# Patient Record
Sex: Female | Born: 1955 | Race: Black or African American | Hispanic: No | Marital: Married | Smoking: Never smoker
Health system: Southern US, Community
[De-identification: ages and names within clinical notes are randomized; demographics above are authoritative.]

## PROBLEM LIST (undated history)

## (undated) DIAGNOSIS — Z803 Family history of malignant neoplasm of breast: Secondary | ICD-10-CM

## (undated) DIAGNOSIS — D649 Anemia, unspecified: Secondary | ICD-10-CM

## (undated) DIAGNOSIS — G8929 Other chronic pain: Secondary | ICD-10-CM

## (undated) DIAGNOSIS — R519 Headache, unspecified: Secondary | ICD-10-CM

## (undated) DIAGNOSIS — I341 Nonrheumatic mitral (valve) prolapse: Secondary | ICD-10-CM

## (undated) DIAGNOSIS — R51 Headache: Secondary | ICD-10-CM

## (undated) DIAGNOSIS — F419 Anxiety disorder, unspecified: Secondary | ICD-10-CM

## (undated) DIAGNOSIS — M12579 Traumatic arthropathy, unspecified ankle and foot: Secondary | ICD-10-CM

## (undated) DIAGNOSIS — J302 Other seasonal allergic rhinitis: Secondary | ICD-10-CM

## (undated) DIAGNOSIS — K219 Gastro-esophageal reflux disease without esophagitis: Secondary | ICD-10-CM

## (undated) DIAGNOSIS — T7840XA Allergy, unspecified, initial encounter: Secondary | ICD-10-CM

## (undated) DIAGNOSIS — G4730 Sleep apnea, unspecified: Secondary | ICD-10-CM

## (undated) DIAGNOSIS — N6019 Diffuse cystic mastopathy of unspecified breast: Secondary | ICD-10-CM

## (undated) DIAGNOSIS — R739 Hyperglycemia, unspecified: Secondary | ICD-10-CM

## (undated) HISTORY — DX: Diffuse cystic mastopathy of unspecified breast: N60.19

## (undated) HISTORY — DX: Other seasonal allergic rhinitis: J30.2

## (undated) HISTORY — DX: Other chronic pain: G89.29

## (undated) HISTORY — DX: Nonrheumatic mitral (valve) prolapse: I34.1

## (undated) HISTORY — DX: Headache: R51

## (undated) HISTORY — DX: Traumatic arthropathy, unspecified ankle and foot: M12.579

## (undated) HISTORY — DX: Headache, unspecified: R51.9

## (undated) HISTORY — DX: Family history of malignant neoplasm of breast: Z80.3

## (undated) HISTORY — DX: Hyperglycemia, unspecified: R73.9

## (undated) HISTORY — DX: Anemia, unspecified: D64.9

## (undated) HISTORY — DX: Allergy, unspecified, initial encounter: T78.40XA

## (undated) HISTORY — DX: Gastro-esophageal reflux disease without esophagitis: K21.9

## (undated) HISTORY — DX: Anxiety disorder, unspecified: F41.9

## (undated) HISTORY — DX: Sleep apnea, unspecified: G47.30

---

## 1977-12-04 HISTORY — PX: BREAST LUMPECTOMY: SHX2

## 1996-09-03 HISTORY — PX: TOTAL ABDOMINAL HYSTERECTOMY: SHX209

## 2003-05-05 HISTORY — PX: ANKLE FRACTURE SURGERY: SHX122

## 2003-12-05 LAB — CONVERTED CEMR LAB: Pap Smear: NORMAL

## 2004-03-06 HISTORY — PX: BREAST BIOPSY: SHX20

## 2004-03-08 ENCOUNTER — Encounter: Payer: Self-pay | Admitting: Family Medicine

## 2006-08-04 LAB — HM MAMMOGRAPHY: HM Mammogram: NORMAL

## 2007-01-11 ENCOUNTER — Ambulatory Visit: Payer: Self-pay | Admitting: Family Medicine

## 2007-01-11 DIAGNOSIS — N6019 Diffuse cystic mastopathy of unspecified breast: Secondary | ICD-10-CM | POA: Insufficient documentation

## 2007-01-11 DIAGNOSIS — N318 Other neuromuscular dysfunction of bladder: Secondary | ICD-10-CM | POA: Insufficient documentation

## 2007-01-11 LAB — CONVERTED CEMR LAB
Glucose, Urine, Semiquant: NEGATIVE
Ketones, urine, test strip: NEGATIVE
Nitrite: NEGATIVE
Urobilinogen, UA: 0.2
WBC, UA: 0 cells/hpf
pH: 6

## 2007-01-14 LAB — CONVERTED CEMR LAB
ALT: 13 units/L (ref 0–35)
AST: 17 units/L (ref 0–37)
Albumin: 4.1 g/dL (ref 3.5–5.2)
Alkaline Phosphatase: 58 units/L (ref 39–117)
BUN: 8 mg/dL (ref 6–23)
Basophils Absolute: 0.1 10*3/uL (ref 0.0–0.1)
Calcium: 9.5 mg/dL (ref 8.4–10.5)
Chloride: 101 meq/L (ref 96–112)
Cholesterol: 180 mg/dL (ref 0–200)
Eosinophils Absolute: 0.1 10*3/uL (ref 0.0–0.6)
GFR calc non Af Amer: 95 mL/min
LDL Cholesterol: 123 mg/dL — ABNORMAL HIGH (ref 0–99)
MCHC: 34.7 g/dL (ref 30.0–36.0)
MCV: 92.4 fL (ref 78.0–100.0)
Monocytes Relative: 5 % (ref 3.0–11.0)
Platelets: 344 10*3/uL (ref 150–400)
RBC: 3.64 M/uL — ABNORMAL LOW (ref 3.87–5.11)
RDW: 12.5 % (ref 11.5–14.6)
Sodium: 135 meq/L (ref 135–145)
TSH: 2.14 microintl units/mL (ref 0.35–5.50)
Total CHOL/HDL Ratio: 3.8
Triglycerides: 46 mg/dL (ref 0–149)

## 2007-02-12 ENCOUNTER — Ambulatory Visit: Payer: Self-pay | Admitting: Family Medicine

## 2007-02-12 DIAGNOSIS — D649 Anemia, unspecified: Secondary | ICD-10-CM

## 2007-04-28 ENCOUNTER — Ambulatory Visit: Payer: Self-pay | Admitting: Family Medicine

## 2007-04-29 ENCOUNTER — Encounter: Payer: Self-pay | Admitting: Family Medicine

## 2007-05-04 ENCOUNTER — Telehealth: Payer: Self-pay | Admitting: Family Medicine

## 2007-05-04 ENCOUNTER — Encounter: Payer: Self-pay | Admitting: Family Medicine

## 2007-05-05 HISTORY — PX: BREAST CYST ASPIRATION: SHX578

## 2007-05-10 ENCOUNTER — Telehealth: Payer: Self-pay | Admitting: Family Medicine

## 2007-05-14 ENCOUNTER — Encounter: Admission: RE | Admit: 2007-05-14 | Discharge: 2007-05-14 | Payer: Self-pay | Admitting: Family Medicine

## 2007-05-26 ENCOUNTER — Ambulatory Visit: Payer: Self-pay | Admitting: Gynecology

## 2007-09-01 ENCOUNTER — Ambulatory Visit: Payer: Self-pay | Admitting: Gynecology

## 2007-09-13 ENCOUNTER — Ambulatory Visit: Payer: Self-pay | Admitting: Family Medicine

## 2007-09-28 ENCOUNTER — Ambulatory Visit: Payer: Self-pay | Admitting: Obstetrics & Gynecology

## 2007-10-12 ENCOUNTER — Telehealth (INDEPENDENT_AMBULATORY_CARE_PROVIDER_SITE_OTHER): Payer: Self-pay | Admitting: *Deleted

## 2007-10-13 ENCOUNTER — Encounter (INDEPENDENT_AMBULATORY_CARE_PROVIDER_SITE_OTHER): Payer: Self-pay | Admitting: *Deleted

## 2007-10-13 ENCOUNTER — Telehealth (INDEPENDENT_AMBULATORY_CARE_PROVIDER_SITE_OTHER): Payer: Self-pay | Admitting: *Deleted

## 2007-10-13 LAB — CONVERTED CEMR LAB
Ketones, urine, test strip: NEGATIVE
Nitrite: NEGATIVE
Protein, U semiquant: NEGATIVE
RBC / HPF: 0
Specific Gravity, Urine: 1.02
Urine crystals, microscopic: 0 /hpf
pH: 6

## 2007-10-14 ENCOUNTER — Encounter: Payer: Self-pay | Admitting: Family Medicine

## 2007-10-19 ENCOUNTER — Ambulatory Visit: Payer: Self-pay | Admitting: Family Medicine

## 2007-10-28 ENCOUNTER — Telehealth: Payer: Self-pay | Admitting: Internal Medicine

## 2007-10-28 ENCOUNTER — Encounter: Payer: Self-pay | Admitting: Family Medicine

## 2007-11-08 ENCOUNTER — Ambulatory Visit: Payer: Self-pay | Admitting: Obstetrics and Gynecology

## 2007-12-07 ENCOUNTER — Ambulatory Visit: Payer: Self-pay | Admitting: Family Medicine

## 2007-12-16 ENCOUNTER — Encounter: Payer: Self-pay | Admitting: Family Medicine

## 2008-01-19 ENCOUNTER — Ambulatory Visit: Payer: Self-pay | Admitting: Family Medicine

## 2008-01-19 LAB — CONVERTED CEMR LAB
OCCULT 2: NEGATIVE
OCCULT 3: NEGATIVE

## 2008-02-14 ENCOUNTER — Ambulatory Visit: Payer: Self-pay | Admitting: Family Medicine

## 2008-02-15 LAB — CONVERTED CEMR LAB
Albumin: 4.1 g/dL (ref 3.5–5.2)
Alkaline Phosphatase: 56 units/L (ref 39–117)
Basophils Absolute: 0.1 10*3/uL (ref 0.0–0.1)
Bilirubin, Direct: 0.1 mg/dL (ref 0.0–0.3)
Eosinophils Absolute: 0.2 10*3/uL (ref 0.0–0.7)
GFR calc Af Amer: 167 mL/min
GFR calc non Af Amer: 138 mL/min
HCT: 32.9 % — ABNORMAL LOW (ref 36.0–46.0)
HDL: 51.9 mg/dL (ref >39.0)
MCV: 93.2 fL (ref 78.0–100.0)
Monocytes Absolute: 0.5 10*3/uL (ref 0.1–1.0)
Platelets: 284 10*3/uL (ref 150–400)
Potassium: 4.1 meq/L (ref 3.5–5.1)
RDW: 12.4 % (ref 11.5–14.6)
Sodium: 138 meq/L (ref 135–145)
Triglycerides: 53 mg/dL (ref 0–149)

## 2008-05-18 ENCOUNTER — Ambulatory Visit: Payer: Self-pay | Admitting: Family Medicine

## 2008-07-25 ENCOUNTER — Encounter: Payer: Self-pay | Admitting: Family Medicine

## 2008-07-31 ENCOUNTER — Encounter (INDEPENDENT_AMBULATORY_CARE_PROVIDER_SITE_OTHER): Payer: Self-pay | Admitting: *Deleted

## 2008-08-02 ENCOUNTER — Ambulatory Visit: Payer: Self-pay | Admitting: Family Medicine

## 2008-08-21 ENCOUNTER — Encounter: Payer: Self-pay | Admitting: Family Medicine

## 2008-08-28 ENCOUNTER — Encounter: Payer: Self-pay | Admitting: Family Medicine

## 2008-08-28 ENCOUNTER — Ambulatory Visit: Payer: Self-pay | Admitting: Family Medicine

## 2008-08-30 ENCOUNTER — Telehealth: Payer: Self-pay | Admitting: Family Medicine

## 2008-10-11 ENCOUNTER — Ambulatory Visit: Payer: Self-pay | Admitting: Family Medicine

## 2008-10-12 LAB — CONVERTED CEMR LAB
Cholesterol: 172 mg/dL (ref 0–200)
Hgb A1c MFr Bld: 6 % (ref 4.6–6.5)
LDL Cholesterol: 108 mg/dL — ABNORMAL HIGH (ref 0–99)
Microalb Creat Ratio: 4.5 mg/g (ref 0.0–30.0)
Triglycerides: 46 mg/dL (ref 0.0–149.0)
VLDL: 9.2 mg/dL (ref 0.0–40.0)

## 2008-10-19 ENCOUNTER — Encounter: Payer: Self-pay | Admitting: Obstetrics & Gynecology

## 2008-10-19 LAB — CONVERTED CEMR LAB
WBC, Wet Prep HPF POC: NONE SEEN
Yeast Wet Prep HPF POC: NONE SEEN

## 2008-11-22 ENCOUNTER — Ambulatory Visit: Payer: Self-pay | Admitting: Family Medicine

## 2008-11-22 DIAGNOSIS — E785 Hyperlipidemia, unspecified: Secondary | ICD-10-CM

## 2008-11-22 LAB — HM DIABETES FOOT EXAM

## 2008-11-28 ENCOUNTER — Encounter: Payer: Self-pay | Admitting: Family Medicine

## 2008-12-15 ENCOUNTER — Telehealth: Payer: Self-pay | Admitting: Family Medicine

## 2009-01-29 ENCOUNTER — Encounter: Payer: Self-pay | Admitting: Family Medicine

## 2009-02-05 ENCOUNTER — Ambulatory Visit: Payer: Self-pay | Admitting: Family Medicine

## 2009-02-19 ENCOUNTER — Ambulatory Visit: Payer: Self-pay | Admitting: Family Medicine

## 2009-02-26 ENCOUNTER — Telehealth: Payer: Self-pay | Admitting: Family Medicine

## 2009-03-01 ENCOUNTER — Ambulatory Visit: Payer: Self-pay | Admitting: Family Medicine

## 2009-03-02 ENCOUNTER — Telehealth: Payer: Self-pay | Admitting: Family Medicine

## 2009-03-05 ENCOUNTER — Encounter: Payer: Self-pay | Admitting: Family Medicine

## 2009-03-07 ENCOUNTER — Ambulatory Visit: Payer: Self-pay | Admitting: Family Medicine

## 2009-03-27 ENCOUNTER — Telehealth (INDEPENDENT_AMBULATORY_CARE_PROVIDER_SITE_OTHER): Payer: Self-pay | Admitting: *Deleted

## 2009-03-27 ENCOUNTER — Ambulatory Visit: Payer: Self-pay | Admitting: Family Medicine

## 2009-03-29 LAB — CONVERTED CEMR LAB
ALT: 13 units/L (ref 0–35)
AST: 16 units/L (ref 0–37)
BUN: 14 mg/dL (ref 6–23)
Basophils Absolute: 0 10*3/uL (ref 0.0–0.1)
Bilirubin, Direct: 0.1 mg/dL (ref 0.0–0.3)
Calcium: 9.5 mg/dL (ref 8.4–10.5)
Cholesterol: 172 mg/dL (ref 0–200)
Creatinine, Ser: 0.8 mg/dL (ref 0.4–1.2)
Eosinophils Relative: 2.8 % (ref 0.0–5.0)
GFR calc non Af Amer: 96.79 mL/min (ref >60)
HDL: 55.1 mg/dL (ref >39.00)
LDL Cholesterol: 106 mg/dL — ABNORMAL HIGH (ref 0–99)
Lymphocytes Relative: 26 % (ref 12.0–46.0)
Monocytes Relative: 5.6 % (ref 3.0–12.0)
Neutrophils Relative %: 65 % (ref 43.0–77.0)
Platelets: 315 10*3/uL (ref 150.0–400.0)
Potassium: 4.5 meq/L (ref 3.5–5.1)
Total Bilirubin: 0.6 mg/dL (ref 0.3–1.2)
VLDL: 11.4 mg/dL (ref 0.0–40.0)
WBC: 6.2 10*3/uL (ref 4.5–10.5)

## 2009-04-04 ENCOUNTER — Ambulatory Visit: Payer: Self-pay | Admitting: Family Medicine

## 2009-04-04 DIAGNOSIS — R739 Hyperglycemia, unspecified: Secondary | ICD-10-CM

## 2009-04-04 DIAGNOSIS — R7303 Prediabetes: Secondary | ICD-10-CM | POA: Insufficient documentation

## 2009-05-14 ENCOUNTER — Ambulatory Visit: Payer: Self-pay | Admitting: Family Medicine

## 2009-05-15 ENCOUNTER — Encounter: Payer: Self-pay | Admitting: Family Medicine

## 2009-06-06 ENCOUNTER — Telehealth: Payer: Self-pay | Admitting: Family Medicine

## 2009-07-12 ENCOUNTER — Telehealth: Payer: Self-pay | Admitting: Family Medicine

## 2009-07-13 ENCOUNTER — Telehealth: Payer: Self-pay | Admitting: Family Medicine

## 2009-07-16 ENCOUNTER — Telehealth: Payer: Self-pay | Admitting: Family Medicine

## 2009-07-16 ENCOUNTER — Ambulatory Visit: Payer: Self-pay | Admitting: Internal Medicine

## 2009-07-31 ENCOUNTER — Telehealth: Payer: Self-pay | Admitting: Internal Medicine

## 2009-08-02 ENCOUNTER — Encounter: Payer: Self-pay | Admitting: Family Medicine

## 2009-08-16 ENCOUNTER — Encounter: Payer: Self-pay | Admitting: Family Medicine

## 2009-08-20 ENCOUNTER — Encounter: Payer: Self-pay | Admitting: Family Medicine

## 2009-09-04 ENCOUNTER — Telehealth: Payer: Self-pay | Admitting: Family Medicine

## 2009-11-28 ENCOUNTER — Encounter: Payer: Self-pay | Admitting: Family Medicine

## 2009-11-28 LAB — HM DIABETES EYE EXAM: HM Diabetic Eye Exam: NORMAL

## 2010-01-30 ENCOUNTER — Encounter: Payer: Self-pay | Admitting: Family Medicine

## 2010-01-30 ENCOUNTER — Telehealth: Payer: Self-pay | Admitting: Family Medicine

## 2010-01-30 ENCOUNTER — Ambulatory Visit
Admission: RE | Admit: 2010-01-30 | Discharge: 2010-01-30 | Payer: Self-pay | Source: Home / Self Care | Attending: Family Medicine | Admitting: Family Medicine

## 2010-01-30 DIAGNOSIS — J019 Acute sinusitis, unspecified: Secondary | ICD-10-CM | POA: Insufficient documentation

## 2010-03-05 NOTE — Progress Notes (Signed)
  Phone Note Call from Patient   Summary of Call: needs to go for mammogram   Follow-up for Phone Call        will order for [facility]  Follow-up by: Judith Part MD,  July 12, 2009 12:39 PM

## 2010-03-05 NOTE — Progress Notes (Signed)
Summary: Request return to work ltr..  Phone Note Call from Patient   Caller: Patient Call For: Judith Part MD Summary of Call: Carolyn Matthews called, will need a ltr to return to work. Her start date is Monday, Feb 1,2011.Marland KitchenShe will pick the ltr up  on Monday.Marland KitchenMarland KitchenDaine Gip  March 02, 2009 1:36 PM  Initial call taken by: Daine Gip,  March 02, 2009 1:36 PM  Follow-up for Phone Call        today is monday-- actually jan 31 instead of feb 1 will write note  Follow-up by: Judith Part MD,  March 05, 2009 7:56 AM  Additional Follow-up for Phone Call Additional follow up Details #1::        Letter given to [facility]. Additional Follow-up by: Lowella Petties CMA,  March 05, 2009 8:19 AM

## 2010-03-05 NOTE — Miscellaneous (Signed)
Summary: Hemoccult screening  Clinical Lists Changes  Observations: Added new observation of HEMOCCULT: negative (05/14/2009 10:59)      Preventive Care Screening  Hemoccult:    Date:  05/14/2009    Results:  negative

## 2010-03-05 NOTE — Assessment & Plan Note (Signed)
Summary: CPX / LFW   Vital Signs:  Patient profile:   55 year old female Height:      64 inches Weight:      162.25 pounds BMI:     27.95 Temp:     98 degrees F oral Pulse rate:   80 / minute Pulse rhythm:   regular BP sitting:   136 / 78  (left arm) Cuff size:   regular  Vitals Entered By: Lewanda Rife LPN (April 04, 2540 2:31 PM)  History of Present Illness: here for health mt exam and to disc chronic med problems   back and neck are finally getting better (disc problems)  little numbness - not as often  was really miserable for a while   wt is stable   bp is fairly good  watching hyperglycemia -- good AIC at 5.7 is really doing well with the diet  starting back some exercise -- and wt is back to normal   lipids good with trig 57/ HDL 55 adn LDL 106  pap 05 has had hyst partial  no symptoms or problems   mam 6/10  self exam  no lumps on self exam -- just tenderness occ in same spot  fibrocytic breasts have not changed  is drinking less caffiene   colon screen - does not want to do colonosc yet  will do immunoassay test    TD 08 no flu shot due to egg allergy   daughter with breast ca she is now pregnant -- is a difficult situation   slt anemia - no change from normal for her   Allergies: 1)  ! 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 Eyes:  Denies blurring and eye irritation. CV:  Denies chest pain or discomfort, lightheadness, palpitations, shortness of breath with exertion, and swelling of feet. Resp:  Denies cough, shortness of breath, and wheezing. GI:  Denies abdominal pain, bloody stools, change in bowel habits, and indigestion. GU:  Denies abnormal vaginal bleeding, discharge, dysuria, and urinary frequency. MS:  Complains of mid back pain; denies cramps and muscle weakness. Derm:  Denies lesion(s), poor wound healing, and rash. Neuro:  Complains of tingling; denies headaches and numbness. Psych:  mood is generally ok - has stress . Endo:  Denies cold intolerance, excessive thirst, excessive urination, and heat intolerance. Heme:  Denies abnormal bruising and bleeding.  Physical Exam  General:  Well-developed,well-nourished,in no acute distress; alert,appropriate and cooperative throughout examination Head:  normocephalic, atraumatic, and no abnormalities observed.   Eyes:  vision grossly intact, pupils equal, pupils round, and pupils reactive to light.  no conjunctival pallor, injection or icterus  Ears:  R ear normal and  L ear normal.   Nose:  no nasal discharge.   Mouth:  pharynx pink and moist.   Neck:  supple with full rom and no masses or thyromegally, no JVD or carotid bruit  Chest Wall:  No deformities, masses, or tenderness noted. Breasts:  No mass, nodules, thickening, tenderness, bulging, retraction, inflamation, nipple discharge or skin changes noted.   Lungs:  Normal respiratory effort, chest expands symmetrically. Lungs are clear to auscultation, no crackles or wheezes. Heart:  soft systolic M at left sternal border RRR Abdomen:  Bowel  sounds positive,abdomen soft and non-tender without masses, organomegaly or hernias noted. no renal bruits  Msk:  No deformity or scoliosis noted of thoracic or lumbar spine.  rom neck and TS is improved without tenderness  Pulses:  R and L carotid,radial,femoral,dorsalis pedis and posterior tibial pulses are full and equal bilaterally Extremities:  No clubbing, cyanosis, edema, or deformity noted with normal full range of motion of all joints.   Neurologic:  sensation intact to light touch, gait normal, and DTRs symmetrical and normal.   Skin:  Intact without suspicious lesions or rashes some lentigos Cervical Nodes:  No lymphadenopathy noted Axillary Nodes:  No palpable lymphadenopathy Inguinal Nodes:  No significant adenopathy Psych:  normal affect, talkative and pleasant    Impression & Recommendations:  Problem # 1:  HEALTH MAINTENANCE EXAM (ICD-V70.0) Assessment Comment Only  reviewed health habits including diet, exercise and skin cancer prevention reviewed health maintenance list and family history commended on better habits and improved hyperglycemia  reviewed labs and pt given copy today  Orders: Prescription Created Electronically [phone])  Problem # 2:  HYPERLIPIDEMIA (ICD-272.4) Assessment: Improved  cholesterol is very well controlled with diet  keep it up and add exercise  re check in 1 year  Labs Reviewed: SGOT: 16 (03/27/2009)   SGPT: 13 (03/27/2009)  Lipid Goals: Chol Goal: 200 (02/19/2009)   HDL Goal: 40 (02/19/2009)   LDL Goal: 100 (02/19/2009)   TG Goal: 150 (02/19/2009)  Prior 10 Yr Risk Heart Disease: 11 % (02/19/2009)   HDL:55.10 (03/27/2009), 54.50 (10/11/2008)  LDL:106 (03/27/2009), 108 (10/11/2008)  Chol:172 (03/27/2009), 172 (10/11/2008)  Trig:57.0 (03/27/2009), 46.0 (10/11/2008)  Orders: Prescription Created Electronically [phone])  Problem # 3:  OTHER SCREENING MAMMOGRAM (ICD-V76.12) Assessment: Comment Only  not due until june- will plan  then  no change on breast exam  enc continued self exams   Orders: Prescription Created Electronically [phone])  Problem # 4:  HYPERGLYCEMIA, BORDERLINE (ICD-790.29) Assessment: Improved  improved with AIC under 6  urged to keep up the good work with low sugar diet and exercise plan to check this yearly or more often if needed   Orders: Prescription Created Electronically [phone])  Problem # 5:  ANEMIA, MILD (ICD-285.9) Assessment: Unchanged  this remains stable- with lifelong hx and ? etiol  stool card given- pt not interested in colonosc yet  Orders: Prescription Created Electronically [phone])  Complete Medication List: 1)  Nasonex 50 Mcg/act Susp (Mometasone furoate) .... 2 sprays in each nostril one time daily as needed 2)  Allegra-d 12 Hour 60-120 Mg Tb12 (Fexofenadine-pseudoephedrine) .... Take one by mouth two times a day as needed 3)  Glucometer One Touch Ultra Mini  .... To check glucose as directed for dm 250.0 4)  Bd Ultrafine Lancets and Test Strips  .... To check glucose two times a day and as needed for new dm 250.0 5)  Multivitamins Tabs (Multiple vitamin) .... One daily 6)  Flexeril 10 Mg Tabs (  Cyclobenzaprine hcl) .... 1/2 to 1 by mouth up to three times a day as needed back pain 7)  Ec-naprosyn 500 Mg Tbec (Naproxen) .... One tab by mouth twice a day after brfst and supper as needed.  Patient Instructions: 1)  your mammogram is due in june --- do not forget to schedule it or have [place] do it  2)  great job with the diet - sugar is better and cholesterol control is good 3)  slowly work on getting back to exercise  Prescriptions: NASONEX 50 MCG/ACT  SUSP (MOMETASONE FUROATE) 2 sprays in each nostril one time daily as needed  #1 mdi x 11   Entered and Authorized by:   Judith Part MD   Signed by:   Judith Part MD on 04/04/2009   Method used:   [facility]        [facility] [facility]       [address]       [place],  [place]  [address]       Ph: [phone] or [phone]       Fax: [phone]   RxID:   [id number]   Current Allergies (reviewed today): ! * EGGS

## 2010-03-05 NOTE — Progress Notes (Signed)
Summary: still feeling sick  Phone Note Call from Patient Call back at [phone]   Caller: Patient Call For: Judith Part MD Summary of Call: Carolyn Matthews called to let you know that she is still having the same symptoms as last week and they have not improved at all. [facility]. Initial call taken by: Melody Comas,  July 16, 2009 8:58 AM  Follow-up for Phone Call        put her in for appt this week with first availible please to evaluate her Follow-up by: Judith Part MD,  July 16, 2009 9:16 AM  Additional Follow-up for Phone Call Additional follow up Details #1::        Patient Advised. Appointment scheduled  today with Dr. Alphonsus Sias. Additional Follow-up by: Delilah Shan CMA Duncan Dull),  July 16, 2009 9:38 AM
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
## 2010-03-05 NOTE — Letter (Signed)
Summary: [facility]: Immunoassay Fecal Occult Blood (iFOB) Order Form  [place] [facility]  [address] [place], [place] [address]   Phone: [phone]  Fax: [phone]      [facility]: Immunoassay Fecal Occult Blood (iFOB) Order Form   April 04, 2009 MRN: [id number]   Carolyn Matthews Nov 03, 1955   Physicican Name:_________Tower________________  Diagnosis Code:__________V76.41________________      Judith Part MD

## 2010-03-05 NOTE — Progress Notes (Signed)
----   Converted from flag ---- ---- 03/26/2009 8:59 PM, Colon Flattery Tower MD wrote: please do wellness, lipid, AIC v70.0, 250.0, 272 - thanks   ---- 03/26/2009 1:06 PM, Mills Koller wrote: pt is scheduled foe labs for a cpx, only orders are lipid,alt,ast, and a1c. Is this all she needs for cpx labs? anything to add?  Thanks Tasha ------------------------------

## 2010-03-05 NOTE — Assessment & Plan Note (Signed)
Summary: 2:15 Congestion, cough, feels bad /lsf   Vital Signs:  Patient profile:   55 year old female Weight:      163 pounds O2 Sat:      98 % on Room air Temp:     98.4 degrees F oral Pulse rate:   90 / minute Pulse rhythm:   regular Resp:     14 per minute BP sitting:   128 / 70  (left arm) Cuff size:   regular  Vitals Entered By: Mervin Hack CMA (AAMA) (July 16, 2009 2:30 PM)  O2 Flow:  Room air CC: cold   History of Present Illness: Got sick a week ago Very sore throat Pain into ears now hoarseness as well Lots of cough---now with increasing yellow mucus Not sleeping due to cough and PND  No fever No sig SOB  tried benedry allergy, cough syrup----not really helping  Allergies: 1)  ! * Eggs  Past History:  Past medical, surgical, family and social histories (including risk factors) reviewed for relevance to current acute and chronic problems.  Past Medical History: Reviewed history from 04/04/2009 and no changes required. mitral valve prolapse anemia headaches allergies fibrocystic breast disease fam hx breast ca (daughter) hyperglycemia - controlled by diet   gyn -- [facility]   Past Surgical History: Reviewed history from 05/15/2007 and no changes required. breast bx 2/06- neg hyst 8/98- partial fibroids ankle fx 4/05 breast lump removal 11/79 4/09 cysts aspirated in both breasts- B9  Family History: Reviewed history from 09/13/2007 and no changes required. 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  NAD freq coarse cough Head:  no frontal or maxillary tenderness Ears:  R ear normal and L ear  normal.   Nose:  moderate congestion yellow mucus on right Mouth:  no erythema and no exudates.   Neck:  supple, no masses, and no cervical lymphadenopathy.  Slight tenderness palpating for nodes Lungs:  normal respiratory effort, no intercostal retractions, no accessory muscle use, normal breath sounds, no crackles, and no wheezes.     Impression & Recommendations:  Problem # 1:  SINUSITIS - ACUTE-NOS (ICD-461.9) Assessment New likely bacterial at this point will try amoxil and tramadol for the cough since codeine not helping  The following medications were removed from the medication list:    Guaifenesin-codeine 100-10 Mg/15ml Syrp (Guaifenesin-codeine) .Marland Kitchen... 1-2 teaspoons by mouth up to every 4-6 hours as needed cough  warning- may sedate Her updated medication list for this problem includes:    Nasonex 50 Mcg/act Susp (Mometasone furoate) .Marland Kitchen... 2 sprays in each nostril one time daily as needed    Allegra-d 12 Hour 60-120 Mg Tb12 (Fexofenadine-pseudoephedrine) .Marland Kitchen... Take one by mouth two times a day as needed    Amoxicillin 500 Mg Tabs (Amoxicillin) .Marland Kitchen... 2 tabs by mouth two times a day for sinus infection  Complete Medication List: 1)  Nasonex 50 Mcg/act Susp (Mometasone furoate) .... 2 sprays in each nostril one time daily as needed 2)  Allegra-d 12 Hour 60-120 Mg Tb12 (Fexofenadine-pseudoephedrine) .... Take one by mouth two times a day  as needed 3)  Multivitamins Tabs (Multiple vitamin) .... One daily 4)  Ec-naprosyn 500 Mg Tbec (Naproxen) .... One tab by mouth twice a day after brfst and supper as needed. 5)  Bd Ultra-fine Lancets Misc (Lancets) .... Check two times a day 6)  Bd Test Strp (Glucose blood) .... Test two times a day 7)  Amoxicillin 500 Mg Tabs (Amoxicillin) .... 2 tabs by mouth two times a day for sinus infection 8)  Tramadol Hcl 50 Mg Tabs (Tramadol hcl) .Marland Kitchen.. 1-2 tabs by mouth at bedtime as needed for cough  Patient Instructions: 1)  Please schedule a follow-up  appointment as needed .  Prescriptions: TRAMADOL HCL 50 MG TABS (TRAMADOL HCL) 1-2 tabs by mouth at bedtime as needed for cough  #30 x 0   Entered and Authorized by:   Cindee Salt MD   Signed by:   Cindee Salt MD on 07/16/2009   Method used:   [facility]        [id number]       [address]       [place], [place]  [address]       Ph: [phone] or [phone]       Fax: [phone]   RxID:   [id number] AMOXICILLIN 500 MG TABS (AMOXICILLIN) 2 tabs by mouth two times a day for sinus infection  #40 x 0   Entered and Authorized by:   Cindee Salt MD   Signed by:   Cindee Salt MD on 07/16/2009   Method used:   [facility]        [facility] [facility]       [address]       [place], [place]  [address]       Ph: [phone] or [phone]       Fax: [phone]   RxID:   [id number]   Current Allergies (reviewed today): ! * EGGS

## 2010-03-05 NOTE — Letter (Signed)
Summary: Results Follow up Letter  [place] [facility]  [address] [place], [place] [address]   Phone: [phone]  Fax: [phone]    05/15/2009 MRN: [id number]    [facility] [address] [place], [place]  [address]    Dear Ms. Solly,  The following are the results of your recent test(s):  Test         Result    Pap Smear:        Normal _____  Not Normal _____ Comments: ______________________________________________________ Cholesterol: LDL(Bad cholesterol):         Your goal is less than:         HDL (Good cholesterol):       Your goal is more than: Comments:  ______________________________________________________ Mammogram:        Normal _____  Not Normal _____ Comments:  ___________________________________________________________________ Hemoccult:        Normal __X_  Not normal _______ Comments:  Stool screening test was negative.  _____________________________________________________________________ Other Tests:    We routinely do not discuss normal results over the telephone.  If you desire a copy of the results, or you have any questions about this information we can discuss them at your next office visit.   Sincerely,    Idamae Schuller Jaleyah Longhi,MD  MT/ri

## 2010-03-05 NOTE — Progress Notes (Signed)
Summary: Pt to start allergy injections  Phone Note Call from Patient Call back at [phone]   Caller: Patient Call For: Judith Part MD Summary of Call: Sevanna's allergy vaccine has come in. Pt will receive 2 injections twice a week on Tues and Fri until she reaches maintanance dose. Pt has rx to get Epipen but forgot to pick one up. Pt said she will get the Epipen and will start the injections next week.Lewanda Rife LPN  September 04, 2009 4:08 PM   Follow-up for Phone Call        ok- sounds like she does not need epi pen px then can start the shots  Follow-up by: Judith Part MD,  September 04, 2009 5:12 PM

## 2010-03-05 NOTE — Progress Notes (Signed)
Summary: sorethroat, cough and ear ache  Phone Note Call from Patient Call back at [phone]   Caller: Patient Call For: Carolyn Part MD Summary of Call: Carolyn Matthews still hoarse and sorethroat and both ears hurt. No fever, head and chest congestion with productive cough with yellow mucus. Started with symptoms on 07/09/09. Pt taking OTC robitussin allergy med. Pt uses [facility] if pharmacy needed. No openings in any doctor's schedule today. Please advise.  Initial call taken by: Lewanda Rife LPN,  July 13, 2009 12:56 PM  Follow-up for Phone Call        is likely viral with time frame of 4 days  get rest and fluids over the weekend  can call in some guifen- codiene for cough  call if much worse over weekend  f/u next week if not improved  px written on EMR for call in  Follow-up by: Carolyn Part MD,  July 13, 2009 1:26 PM  Additional Follow-up for Phone Call Additional follow up Details #1::        Patient notified as instructed by telephone. Medication [facility]oned to [facility] as instructed. Lewanda Rife LPN  July 13, 2009 3:04 PM     New/Updated Medications: GUAIFENESIN-CODEINE 100-10 MG/5ML SYRP (GUAIFENESIN-CODEINE) 1-2 teaspoons by mouth up to every 4-6 hours as needed cough  warning- may sedate Prescriptions: GUAIFENESIN-CODEINE 100-10 MG/5ML SYRP (GUAIFENESIN-CODEINE) 1-2 teaspoons by mouth up to every 4-6 hours as needed cough  warning- may sedate  #120cc x 0   Entered and Authorized by:   Carolyn Part MD   Signed by:   Lewanda Rife LPN on 95/62/1308   Method used:   Telephoned to ...       [facility] (retail)       [address]       [place], [place]  [address]       Ph: [phone] or [phone]       Fax: [phone]   RxID:   [id number]

## 2010-03-05 NOTE — Progress Notes (Signed)
Summary: Not any better  Phone Note Call from Patient   Caller: Patient Call For: Dr. Alphonsus Sias Summary of Call: Patient says that her throat is feeling worse since yesterday.  No fever, a little cough, very congested, still has a lot of drainage.  Finished antibiotics three days ago but still not much better.  Throat very sore, she says she feels like she is swollowing a "golf ball".  Please advise. Initial call taken by: Linde Gillis CMA Duncan Dull),  July 31, 2009 10:08 AM  Follow-up for Phone Call        still lots of drainage and cough will change to augmentin Follow-up by: Cindee Salt MD,  July 31, 2009 10:25 AM    New/Updated Medications: AMOXICILLIN-POT CLAVULANATE 875-125 MG TABS (AMOXICILLIN-POT CLAVULANATE) 1 tab by mouth two times a day after eating for respiratory infection Prescriptions: AMOXICILLIN-POT CLAVULANATE 875-125 MG TABS (AMOXICILLIN-POT CLAVULANATE) 1 tab by mouth two times a day after eating for respiratory infection  #20 x 0   Entered and Authorized by:   Cindee Salt MD   Signed by:   Cindee Salt MD on 07/31/2009   Method used:   Electronically [facility]        [facility] (retail)       [address]       [place], [place]  [address]       Ph: [phone] or [phone]       Fax: [phone]   RxID:   [id number]   Appended Document: Not any better Patient notified, Rx was sent to pharmacy.
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
## 2010-03-05 NOTE — Letter (Signed)
Summary: [facility]  [facility]   Imported By: Lanelle Bal 09/11/2009 10:01:52  _____________________________________________________________________  External Attachment:    Type:   Image     Comment:   External Document

## 2010-03-05 NOTE — Letter (Signed)
Summary: Generic Letter  [place] [facility]  [address] [place], [place] [address]   Phone: [phone]  Fax: [phone]    03/05/2009  ORPHA DAIN [address] [place], [place]  [address]  Dear Ms. FALWELL,    You are medically clear to return to work on March 05, 2009 monday.    Sincerely,   Roxy Manns MD

## 2010-03-05 NOTE — Progress Notes (Signed)
Summary: allegra d  Phone Note Call from Patient Call back at Work Phone [phone]   Caller: Patient Call For: Carolyn Part MD Summary of Call: Carolyn Matthews says that the [name] is OTC now and it is going to cost $1 a pill. She wants to know if there is something else that she can take that would have the same affect as the allegra d. [facility].  Initial call taken by: Melody Comas,  Jun 06, 2009 11:50 AM  Follow-up for Phone Call        she can try claritin D or zyrtec D over the counter -- unsure if less expensive - store brands are ok  Follow-up by: Carolyn Part MD,  Jun 06, 2009 12:07 PM  Additional Follow-up for Phone Call Additional follow up Details #1::        Patient notified as instructed by telephone. Lewanda Rife LPN  Jun 06, 6960 2:34 PM
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
## 2010-03-05 NOTE — Letter (Signed)
Summary: [facility]  [facility]   Imported By: Lanelle Bal 09/11/2009 10:01:06  _____________________________________________________________________  External Attachment:    Type:   Image     Comment:   External Document

## 2010-03-05 NOTE — Progress Notes (Signed)
Summary: Pt ? date of return to work  Phone Note Call from Patient Call back at [phone]   Caller: Patient Call For: Judith Part MD Summary of Call:  Pt left v/m on triage phone. Pt. saw Dr. Milinda Antis on 01/17/11and FMLA form filled out to be out of work. Pt thinks supposed to return to work 10 days from date seen and pt has return appt scheduled on 03/01/09 at 8:00am. Pt wants to confirm date put on FMLA form to return to work. I tried to reach pt by phone to see how she is doing but no answer. Please advise.  Initial call taken by: Lewanda Rife LPN,  February 26, 2009 8:29 AM  Follow-up for Phone Call        I talked to [employer] about her time off- but I do not remember filling out an actual FMLA form?-- did I? Follow-up by: Judith Part MD,  February 26, 2009 8:45 AM  Additional Follow-up for Phone Call Additional follow up Details #1::        Left message for patient to call back. Lewanda Rife LPN  February 26, 2009 9:02 AM  Advised pt form has not been filled out, she is asking if she needs to have one done, I advised her to check with Jamesetta So about this, call transferred. Additional Follow-up by: Lowella Petties CMA,  February 26, 2009 10:38 AM

## 2010-03-05 NOTE — Miscellaneous (Signed)
Summary: PT Initial Eval/[facility]  PT Initial Eval/[facility]   Imported By: Lanelle Bal 03/09/2009 10:32:01  _____________________________________________________________________  External Attachment:    Type:   Image     Comment:   External Document

## 2010-03-05 NOTE — Letter (Signed)
Summary: Diabetic Eye Exam/Dr. Clarene Critchley  Diabetic Eye Exam/Dr. Clarene Critchley   Imported By: Maryln Gottron 12/10/2009 14:53:29  _____________________________________________________________________  External Attachment:    Type:   Image     Comment:   External Document  Appended Document: Diabetic Eye Exam/Dr. Clarene Critchley    Clinical Lists Changes  Observations: Added new observation of DMEYEEXAMNXT: 12/2010 (12/16/2009 21:33) Added new observation of DMEYEEXMRES: normal (11/28/2009 21:34) Added new observation of EYE EXAM BY: Dr Christophe Louis  (11/28/2009 21:34) Added new observation of DIAB EYE EX: normal (11/28/2009 21:34)        Diabetes Management Exam:    Eye Exam:       Eye Exam done elsewhere          Date: 11/28/2009          Results: normal          Done by: Dr Christophe Louis

## 2010-03-05 NOTE — Miscellaneous (Signed)
Summary: Allergy Injection Check List/[facility]  Allergy Injection Check List/[facility]   Imported By: Lanelle Bal 08/24/2009 12:32:38  _____________________________________________________________________  External Attachment:    Type:   Image     Comment:   External Document

## 2010-03-07 NOTE — Letter (Signed)
Summary: Out of Work  [facility]  [address] [place], [place] [address]   Phone: [phone]  Fax: [phone]    January 30, 2010   Employee:  SHERREL PLOCH    To Whom It May Concern:   For Medical reasons, please excuse the above named employee from work for the following dates:  Start:   01/30/2010  End:   to come back tomorrow if she is feeling better  If you need additional information, please feel free to contact our office.         Sincerely,    Judith Part MD

## 2010-03-07 NOTE — Assessment & Plan Note (Signed)
Summary: TERRIBLE SINUS HEADACHE / LFW   Vital Signs:  Patient profile:   55 year old female Height:      64 inches Weight:      163.25 pounds BMI:     28.12 Temp:     98.4 degrees F oral Pulse rate:   80 / minute Pulse rhythm:   regular BP sitting:   128 / 82  (left arm) Cuff size:   regular  Vitals Entered By: Lewanda Rife LPN (January 30, 2010 2:28 PM) CC: sinus headache and sinus drainage at back of throat   History of Present Illness: started symptoms last tuesday  scratchy throat / and sneezing and runny nose  thought she had allergies  no fever / not run down  ears stopped up   allergies had been really bothering her for a while --going up on frequency of injections - and not doing much (is hard to say)   has been coughing and cannot sleep at night - coughing up mucous    facial pain started within a few days  today is unbearable  on L side of nasal bridge  when she blows her nose she feels pressure there  yellow dark nasal d/c  had a little nosebleed an hour ago   no fever? -- some chills and aches   ibuprofen 600 mg at a time  2 tylenol today uses nasonex   does not use saline  took day quil a few times- and helped a bit   throat is a little better   Allergies: 1)  ! * Eggs  Past History:  Past Medical History: Last updated: 04/04/2009 mitral valve prolapse anemia headaches allergies fibrocystic breast disease fam hx breast ca (daughter) hyperglycemia - controlled by diet   gyn -- [facility]   Past Surgical History: Last updated: 05/15/2007 breast bx 2/06- neg hyst 8/98- partial fibroids ankle fx 4/05 breast lump removal 11/79 4/09 cysts aspirated in both breasts- B9  Family History: Last updated: 09/15/2007 father- DM (died of) mother HTN daughter breast ca   daughter has breast cancer (inflammatory)- now brain tumor PGM died of DM  great M aunt with breast ca  Social History: Last updated: 07/16/2009 works in our front   office had some college ed married G2P2 Never Smoked no alcohol   Risk Factors: Smoking Status: never (02/12/2007)  Review of Systems General:  Complains of chills, fatigue, and malaise. Eyes:  Denies blurring and eye pain. ENT:  Complains of earache, postnasal drainage, sinus pressure, and sore throat. CV:  Denies chest pain or discomfort and palpitations. Resp:  Complains of cough; denies pleuritic, shortness of breath, sputum productive, and wheezing. GI:  Denies abdominal pain, change in bowel habits, and indigestion. Derm:  Denies itching, lesion(s), poor wound healing, and rash. Neuro:  Complains of headaches; denies numbness, tingling, and weakness. Heme:  Denies abnormal bruising and bleeding.  Physical Exam  General:  Well-developed,well-nourished,in no acute distress; alert,appropriate and cooperative throughout examination Head:  marked L ethmoid sinus tenderness no rashnormocephalic, atraumatic, and no abnormalities observed.   Eyes:  vision grossly intact, pupils equal, pupils round, pupils reactive to light, and no injection.   Ears:  R ear normal and L ear normal.   Nose:  nares are injected and congested bilaterally  Mouth:  pharynx pink and moist, no erythema, and no exudates.  some clear post nasal drip Neck:  supple with full rom and no masses or thyromegally, no JVD or carotid bruit  Chest  Wall:  No deformities, masses, or tenderness noted. Lungs:  Normal respiratory effort, chest expands symmetrically. Lungs are clear to auscultation, no crackles or wheezes. Heart:  soft systolic M at left sternal border RRR Skin:  Intact without suspicious lesions or rashes Psych:  normal affect, talkative and pleasant    Impression & Recommendations:  Problem # 1:  SINUSITIS, ACUTE (ICD-461.9) Assessment New with 7-8 days of uri and also chronic allergies disc diff between virus and bacteria in depth recommend sympt care- see pt instructions   tx with augmentin pt  advised to update me if symptoms worsen or do not improve  The following medications were removed from the medication list:    Amoxicillin-pot Clavulanate 875-125 Mg Tabs (Amoxicillin-pot clavulanate) .Marland Kitchen... 1 tab by mouth two times a day after eating for respiratory infection Her updated medication list for this problem includes:    Nasonex 50 Mcg/act Susp (Mometasone furoate) .Marland Kitchen... 2 sprays in each nostril one time daily as needed    Allegra-d 12 Hour 60-120 Mg Tb12 (Fexofenadine-pseudoephedrine) .Marland Kitchen... Take one by mouth two times a day as needed    Augmentin 875-125 Mg Tabs (Amoxicillin-pot clavulanate) .Marland Kitchen... 1 by mouth two times a day for 10 days for sinus infection  Complete Medication List: 1)  Nasonex 50 Mcg/act Susp (Mometasone furoate) .... 2 sprays in each nostril one time daily as needed 2)  Allegra-d 12 Hour 60-120 Mg Tb12 (Fexofenadine-pseudoephedrine) .... Take one by mouth two times a day as needed 3)  Multivitamins Tabs (Multiple vitamin) .... One daily 4)  Bd Ultra-fine Lancets Misc (Lancets) .... Check two times a day 5)  Bd Test Strp (Glucose blood) .... Test two times a day 6)  Tramadol Hcl 50 Mg Tabs (Tramadol hcl) .Marland Kitchen.. 1-2 tabs by mouth at bedtime as needed for cough 7)  Ibuprofen 200 Mg Tabs (Ibuprofen) .... Otc as directed. 8)  Tylenol Extra Strength 500 Mg Tabs (Acetaminophen) .... Otc as directed. 9)  Augmentin 875-125 Mg Tabs (Amoxicillin-pot clavulanate) .Marland Kitchen.. 1 by mouth two times a day for 10 days for sinus infection  Patient Instructions: 1)  take the augmentin for sinus infection  2)  I think this began with a cold -- that will take a while to get better  3)  you can try mucinex over the counter twice daily as directed and nasal saline spray for congestion 4)  tylenol over the counter as directed may help with aches, headache and fever 5)  call if symptoms worsen or if not improved in 4-5 days  6)  ibuprofen is ok if it does not bother your stomach  7)  tramadol/  ultram ok at home  8)  update me if facial pain is not improved by the end of the day Prescriptions: AUGMENTIN 875-125 MG TABS (AMOXICILLIN-POT CLAVULANATE) 1 by mouth two times a day for 10 days for sinus infection  #20 x 0   Entered and Authorized by:   Judith Part MD   Signed by:   Judith Part MD on 01/30/2010   Method used:   Electronically to        [id number]retail[facility]       [address]       [place], [place]  [address]       Ph: [phone] or [phone]       Fax: [phone]   RxID:   [id number]    Orders Added: 1)  Est. Patient Level III [36644]  Current Allergies (reviewed today): ! * EGGS

## 2010-03-07 NOTE — Progress Notes (Signed)
Summary: re allergy shot  Phone Note Call from Patient Call back at [phone]   Summary of Call: Tanaysia says that she is very congested, but is not sure if it is in her chest at all and if so they do not want her taking her allergy shot tomorrow. She is asking if it is okay for her to take her shot. Pleae advise.  Initial call taken by: Melody Comas,  January 30, 2010 3:37 PM  Follow-up for Phone Call        Dr Milinda Antis said since pt feels so bad probably a good idea to postpone the allergy shot.Patient notified as instructed by telephone. Lewanda Rife LPN  January 30, 2010 3:59 PM

## 2010-04-08 ENCOUNTER — Other Ambulatory Visit: Payer: Self-pay

## 2010-04-10 ENCOUNTER — Encounter: Payer: Self-pay | Admitting: Family Medicine

## 2010-04-13 ENCOUNTER — Encounter: Payer: Self-pay | Admitting: Family Medicine

## 2010-04-13 DIAGNOSIS — G8929 Other chronic pain: Secondary | ICD-10-CM | POA: Insufficient documentation

## 2010-04-13 DIAGNOSIS — N6019 Diffuse cystic mastopathy of unspecified breast: Secondary | ICD-10-CM

## 2010-04-13 DIAGNOSIS — I341 Nonrheumatic mitral (valve) prolapse: Secondary | ICD-10-CM | POA: Insufficient documentation

## 2010-04-13 DIAGNOSIS — D649 Anemia, unspecified: Secondary | ICD-10-CM

## 2010-04-13 DIAGNOSIS — J302 Other seasonal allergic rhinitis: Secondary | ICD-10-CM | POA: Insufficient documentation

## 2010-04-13 DIAGNOSIS — Z803 Family history of malignant neoplasm of breast: Secondary | ICD-10-CM | POA: Insufficient documentation

## 2010-04-13 DIAGNOSIS — R739 Hyperglycemia, unspecified: Secondary | ICD-10-CM

## 2010-05-23 ENCOUNTER — Telehealth: Payer: Self-pay | Admitting: Family Medicine

## 2010-05-23 DIAGNOSIS — Z0000 Encounter for general adult medical examination without abnormal findings: Secondary | ICD-10-CM | POA: Insufficient documentation

## 2010-05-23 DIAGNOSIS — E785 Hyperlipidemia, unspecified: Secondary | ICD-10-CM

## 2010-05-23 DIAGNOSIS — J019 Acute sinusitis, unspecified: Secondary | ICD-10-CM

## 2010-05-23 DIAGNOSIS — R739 Hyperglycemia, unspecified: Secondary | ICD-10-CM

## 2010-05-23 NOTE — Telephone Encounter (Signed)
Message copied by Roxy Manns on Thu May 23, 2010  4:57 PM ------      Message from: Liane Comber      Created: Mon May 20, 2010  9:41 AM      Regarding: Cpx labs Mon 4/23       Please order  future cpx labs for pt's upcomming lab appt.      Thanks      Rodney Booze

## 2010-05-27 ENCOUNTER — Other Ambulatory Visit: Payer: Self-pay

## 2010-05-28 ENCOUNTER — Other Ambulatory Visit: Payer: Self-pay

## 2010-05-29 ENCOUNTER — Encounter: Payer: Self-pay | Admitting: Family Medicine

## 2010-05-31 ENCOUNTER — Other Ambulatory Visit: Payer: Self-pay

## 2010-06-03 ENCOUNTER — Encounter: Payer: Self-pay | Admitting: Family Medicine

## 2010-06-03 ENCOUNTER — Other Ambulatory Visit (INDEPENDENT_AMBULATORY_CARE_PROVIDER_SITE_OTHER): Payer: BC Managed Care – PPO | Admitting: Family Medicine

## 2010-06-03 ENCOUNTER — Ambulatory Visit (INDEPENDENT_AMBULATORY_CARE_PROVIDER_SITE_OTHER): Payer: BC Managed Care – PPO | Admitting: Family Medicine

## 2010-06-03 ENCOUNTER — Other Ambulatory Visit: Payer: BC Managed Care – PPO

## 2010-06-03 DIAGNOSIS — R739 Hyperglycemia, unspecified: Secondary | ICD-10-CM

## 2010-06-03 DIAGNOSIS — H6980 Other specified disorders of Eustachian tube, unspecified ear: Secondary | ICD-10-CM

## 2010-06-03 DIAGNOSIS — E785 Hyperlipidemia, unspecified: Secondary | ICD-10-CM

## 2010-06-03 DIAGNOSIS — Z0000 Encounter for general adult medical examination without abnormal findings: Secondary | ICD-10-CM

## 2010-06-03 DIAGNOSIS — R7309 Other abnormal glucose: Secondary | ICD-10-CM

## 2010-06-03 LAB — COMPREHENSIVE METABOLIC PANEL
AST: 17 U/L (ref 0–37)
Albumin: 4 g/dL (ref 3.5–5.2)
Alkaline Phosphatase: 64 U/L (ref 39–117)
BUN: 14 mg/dL (ref 6–23)
Calcium: 9.6 mg/dL (ref 8.4–10.5)
Chloride: 105 mEq/L (ref 96–112)
Potassium: 4.3 mEq/L (ref 3.5–5.1)
Sodium: 139 mEq/L (ref 135–145)
Total Protein: 7 g/dL (ref 6.0–8.3)

## 2010-06-03 LAB — HEMOGLOBIN A1C: Hgb A1c MFr Bld: 6.1 % (ref 4.6–6.5)

## 2010-06-03 LAB — CBC WITH DIFFERENTIAL/PLATELET
Basophils Relative: 1.2 % (ref 0.0–3.0)
Eosinophils Absolute: 0.2 10*3/uL (ref 0.0–0.7)
Eosinophils Relative: 2.3 % (ref 0.0–5.0)
Lymphocytes Relative: 30.3 % (ref 12.0–46.0)
MCHC: 34 g/dL (ref 30.0–36.0)
Monocytes Absolute: 0.4 10*3/uL (ref 0.1–1.0)
Neutrophils Relative %: 60.5 % (ref 43.0–77.0)
Platelets: 357 10*3/uL (ref 150.0–400.0)
RBC: 3.62 Mil/uL — ABNORMAL LOW (ref 3.87–5.11)
WBC: 6.4 10*3/uL (ref 4.5–10.5)

## 2010-06-03 LAB — LIPID PANEL
Cholesterol: 173 mg/dL (ref 0–200)
LDL Cholesterol: 110 mg/dL — ABNORMAL HIGH (ref 0–99)
Total CHOL/HDL Ratio: 3
VLDL: 9.6 mg/dL (ref 0.0–40.0)

## 2010-06-03 MED ORDER — MOMETASONE FUROATE 50 MCG/ACT NA SUSP: NASAL | Status: DC

## 2010-06-03 NOTE — Assessment & Plan Note (Signed)
No indication for abx at this point.  I would continue the [name] twice a day and start back on the nasonex.  Follow up as needed.

## 2010-06-03 NOTE — Patient Instructions (Signed)
I would continue the [name] twice a day and start back on the nasonex.  Follow up as needed.  Take care. Glad to see you.

## 2010-06-03 NOTE — Progress Notes (Signed)
Pain inferior to R ear since Friday.  Intermittent, some better when she took he allergy medicine.  No FCNAVD.  No L ear pain.  No pain on the R pinna.  No rhinorrhea, no ST, no congestion.  H/o sig spring and fall allergies.  On allergy shots.  No pain with chewing. Inc pain with valsalva.    Meds, vitals, and allergies reviewed.   ROS: See HPI.  Otherwise, noncontributory.  GEN: nad, alert and oriented HEENT: mucous membranes moist, tm w/o erythema but poor movement on valsalva, pinna wnl x2, nasal exam w/mild erythema, OP without cobblestoning, TMJ not ttp x2 NECK: supple w/o LA CV: rrr.   PULM: ctab, no inc wob

## 2010-06-18 NOTE — Assessment & Plan Note (Signed)
NAME:  Carolyn Matthews, Carolyn Matthews NO.:  [id number]   MEDICAL RECORD NO.:  [id number]          PATIENT TYPE:  POB   LOCATION:  [facility] at [facility]         FACILITY:  [facility]   PHYSICIAN:  Ginger Carne, MD DATE OF BIRTH:  [birth year]   DATE OF SERVICE:  05/26/2007                                  CLINIC NOTE   [name] presents today for routine gynecological evaluation and  complaint of a slight vaginal discharge that was yellowish in color.  Of  note is she had a hysterectomy in August 1998 for leiomyomatous uterus  and benign disease.  In the early part of this month she had 2 right  breast cysts which were aspirated but otherwise had a normal mammogram.  She denies any cardiac symptomatology.  She has occasional genuine  urinary stress incontinence.  No GI complaints and no gynecological  concerns of note apart from a slight discharge.  The reader is referred  to the check-off history sheet.   The patient has had blood work by her primary care physician in January  2009.  For preventative measures, she was advised that over the next to  3 years a colonoscopy would be advisable.  Discharge is minimal.  PH is  5.0.  Appears to be bacterial vaginosis.  Will be treated with  metronidazole 500 twice a day for 7 days.  The patient understands the  chronic nature of this type of infection.           ______________________________  Ginger Carne, MD     SHB/MEDQ  D:  05/26/2007  T:  05/26/2007  Job:  [id number]

## 2010-06-18 NOTE — Assessment & Plan Note (Signed)
NAME:  Carolyn Matthews, Carolyn Matthews                ACCOUNT NO.:  [id number]   MEDICAL RECORD NO.:  [id number]          PATIENT TYPE:  POB   LOCATION:  [facility] at [facility]         FACILITY:  [facility]   PHYSICIAN:  Johnella Moloney, MD        DATE OF BIRTH:  Apr 19, 1955   DATE OF SERVICE:  09/28/2007                                  CLINIC NOTE   CHIEF COMPLAINT:  Vaginal discharge and vulvar irritation.   HISTORY OF PRESENT ILLNESS:  The patient is a 55 year old female with a  history of a hysterectomy in September 21, 1996, for fibroids who is here  today complaining of vulvar irritation and vaginal discharge.  The  patient reports having white discharge over the past month which is not  pruritic, has no abnormal or unusual odor, but does cause vaginal  burning, and this is not associated with urination or bowel movements  and the burning or the discomfort is not exacerbated by intercourse.  The patient does have a history of recurrent vaginal infections in the  form of candidiasis or bacterial vaginosis.  She denies any abnormal  bleeding.  She has not seen any lesions and has no other complaints.   PHYSICAL EXAMINATION:  VITAL SIGNS:  Blood pressure 134/79, pulse 74,  weight 152 pounds, and height 5 feet 4 inches.  IN GENERAL:  No apparent distress.  ABDOMEN:  Soft, nontender, and nondistended.  PELVIC:  The patient is noted to have normal external female genitalia.  Small amount of white discharge noted in the vaginal vault.  Thin, well-  rugated vagina, well-healed vaginal cuff.  No lesions were noted and no  chronic inflammation disorder noted on vulvar examination.  A sample of  the white discharge was taken for wet prep analysis and gonorrhea and  chlamydia analysis.   ASSESSMENT/PLAN:  The patient is a 55 year old female here for a vaginal  discharge, vaginal burning, wet prep and gonorrhea chlamydia analysis  was done.  We will follow up results.  Given her history of recurrent  vaginal infections.   The patient was counseled regarding the use of  boric acid to rule in an attempt to restore the vaginal pH, the patient  does agree to plan.  She was given prescription for boric acid 600 mg  capsules and instructed to place per vagina at night for the next 2  weeks.  She was also given a prescription for hydrocortisone cream 1% to  apply topically to her vulva area to see if this will help with her  burning.  The patient will be seen in the next 3-4 weeks for followup.  If her burning does continue or worsen or if any lesions rise, this  could be an indication for biopsy .           ______________________________  Johnella Moloney, MD     UD/MEDQ  D:  09/28/2007  T:  09/29/2007  Job:  [id number]

## 2010-06-24 ENCOUNTER — Ambulatory Visit (INDEPENDENT_AMBULATORY_CARE_PROVIDER_SITE_OTHER): Payer: BC Managed Care – PPO | Admitting: Family Medicine

## 2010-06-24 ENCOUNTER — Encounter: Payer: Self-pay | Admitting: Family Medicine

## 2010-06-24 DIAGNOSIS — L309 Dermatitis, unspecified: Secondary | ICD-10-CM | POA: Insufficient documentation

## 2010-06-24 DIAGNOSIS — L259 Unspecified contact dermatitis, unspecified cause: Secondary | ICD-10-CM

## 2010-06-24 MED ORDER — MOMETASONE FUROATE 0.1 % EX CREA: TOPICAL_CREAM | Freq: Every day | CUTANEOUS | Status: AC

## 2010-06-24 NOTE — Progress Notes (Signed)
  Subjective:    Patient ID: Carolyn Matthews, female    DOB: [birth year], 55 y.o.   MRN: [id number]  HPI Bump behind L ear  ? Getting bigger  Had for months  Not painful  Treated with a cream  Is flaky and somewhat itchy No infx in piercings at all   No fever Feeling fine   Past Medical History  Diagnosis Date  . MVP (mitral valve prolapse)   . Anemia   . Chronic headaches   . Seasonal allergies   . Fibrocystic breast disease   . Hyperglycemia     Diet controlled  . Family history of breast cancer in female     Daughter      Review of Systems Review of Systems  Constitutional: Negative for fever, appetite change, fatigue and unexpected weight change.  Eyes: Negative for pain and visual disturbance.  Respiratory: Negative for cough and shortness of breath.   Cardiovascular: Negative.   Gastrointestinal: Negative for nausea, diarrhea and constipation.  Genitourinary: Negative for urgency and frequency.  Skin: Negative for pallor pos for flaking/ itching behind ear .  Neurological: Negative for weakness, light-headedness, numbness and headaches.  Hematological: Negative for adenopathy. Does not bruise/bleed easily.  Psychiatric/Behavioral: Negative for dysphoric mood. The patient is not nervous/anxious.          Objective:   Physical Exam  Constitutional: She appears well-developed and well-nourished.  HENT:  Head: Normocephalic and atraumatic.  Right Ear: External ear normal.  Nose: Nose normal.  Mouth/Throat: Oropharynx is clear and moist.  Eyes: Conjunctivae and EOM are normal. Pupils are equal, round, and reactive to light. Right eye exhibits no discharge. Left eye exhibits no discharge.  Neck: Normal range of motion. Neck supple. No thyromegaly present.  Cardiovascular: Normal rate, regular rhythm and normal heart sounds.   Pulmonary/Chest: Effort normal and breath sounds normal. She has no wheezes.  Lymphadenopathy:    She has no cervical adenopathy.    Neurological: She is alert.  Skin: Skin is warm and dry. Rash noted.       scaley raised area surrounding back of L ear slt clear drainage  nontender No fluctuance    Psychiatric: She has a normal mood and affect.          Assessment & Plan:

## 2010-06-24 NOTE — Assessment & Plan Note (Signed)
Behind L ear - scaley with plaque Cannot r/o psoriasis ? If aggrivated by hair products Trial of elocon cream- update If not imp may need bx

## 2010-06-24 NOTE — Patient Instructions (Signed)
I think spot behind your ear is either eczema (dermatitis) or early psoriasis Protect area well when using hair chemicals Use elocon cream once daily for 2 weeks  Update me if not improved or if worse  I sent px to pharmacy

## 2010-08-22 ENCOUNTER — Telehealth: Payer: Self-pay | Admitting: Family Medicine

## 2010-08-22 DIAGNOSIS — R7309 Other abnormal glucose: Secondary | ICD-10-CM

## 2010-08-22 DIAGNOSIS — Z0000 Encounter for general adult medical examination without abnormal findings: Secondary | ICD-10-CM

## 2010-08-22 DIAGNOSIS — E785 Hyperlipidemia, unspecified: Secondary | ICD-10-CM

## 2010-08-22 NOTE — Telephone Encounter (Signed)
Message copied by Judy Pimple on Thu Aug 22, 2010  5:32 PM ------      Message from: Baldomero Lamy      Created: Tue Aug 20, 2010  9:02 AM      Regarding: cpx labs mon       Please order  future cpx labs for pt's upcomming lab appt.      Thanks      Rodney Booze

## 2010-08-26 ENCOUNTER — Other Ambulatory Visit (INDEPENDENT_AMBULATORY_CARE_PROVIDER_SITE_OTHER): Payer: BC Managed Care – PPO | Admitting: Family Medicine

## 2010-08-26 DIAGNOSIS — Z0000 Encounter for general adult medical examination without abnormal findings: Secondary | ICD-10-CM

## 2010-08-26 DIAGNOSIS — R7309 Other abnormal glucose: Secondary | ICD-10-CM

## 2010-08-26 DIAGNOSIS — E785 Hyperlipidemia, unspecified: Secondary | ICD-10-CM

## 2010-08-26 LAB — CBC WITH DIFFERENTIAL/PLATELET
Eosinophils Absolute: 0.1 10*3/uL (ref 0.0–0.7)
MCHC: 33.4 g/dL (ref 30.0–36.0)
MCV: 92.3 fl (ref 78.0–100.0)
Monocytes Absolute: 0.4 10*3/uL (ref 0.1–1.0)
Neutrophils Relative %: 63.1 % (ref 43.0–77.0)
Platelets: 324 10*3/uL (ref 150.0–400.0)
WBC: 6.4 10*3/uL (ref 4.5–10.5)

## 2010-08-26 LAB — COMPREHENSIVE METABOLIC PANEL
AST: 17 U/L (ref 0–37)
Albumin: 4.7 g/dL (ref 3.5–5.2)
Alkaline Phosphatase: 83 U/L (ref 39–117)
Potassium: 4.5 mEq/L (ref 3.5–5.1)
Sodium: 140 mEq/L (ref 135–145)
Total Protein: 8.5 g/dL — ABNORMAL HIGH (ref 6.0–8.3)

## 2010-08-26 LAB — LIPID PANEL
Cholesterol: 175 mg/dL (ref 0–200)
LDL Cholesterol: 108 mg/dL — ABNORMAL HIGH (ref 0–99)
VLDL: 12.8 mg/dL (ref 0.0–40.0)

## 2010-08-26 LAB — HEMOGLOBIN A1C: Hgb A1c MFr Bld: 6.4 % (ref 4.6–6.5)

## 2010-08-28 ENCOUNTER — Encounter: Payer: Self-pay | Admitting: Family Medicine

## 2010-08-28 ENCOUNTER — Ambulatory Visit (INDEPENDENT_AMBULATORY_CARE_PROVIDER_SITE_OTHER): Payer: BC Managed Care – PPO | Admitting: Family Medicine

## 2010-08-28 DIAGNOSIS — Z1231 Encounter for screening mammogram for malignant neoplasm of breast: Secondary | ICD-10-CM | POA: Insufficient documentation

## 2010-08-28 DIAGNOSIS — R7309 Other abnormal glucose: Secondary | ICD-10-CM

## 2010-08-28 DIAGNOSIS — E785 Hyperlipidemia, unspecified: Secondary | ICD-10-CM

## 2010-08-28 DIAGNOSIS — R779 Abnormality of plasma protein, unspecified: Secondary | ICD-10-CM | POA: Insufficient documentation

## 2010-08-28 DIAGNOSIS — E8809 Other disorders of plasma-protein metabolism, not elsewhere classified: Secondary | ICD-10-CM

## 2010-08-28 DIAGNOSIS — D649 Anemia, unspecified: Secondary | ICD-10-CM

## 2010-08-28 DIAGNOSIS — Z0000 Encounter for general adult medical examination without abnormal findings: Secondary | ICD-10-CM

## 2010-08-28 NOTE — Progress Notes (Signed)
Subjective:    Patient ID: Carolyn Matthews, female    DOB: 1955/06/14, 55 y.o.   MRN: [id number]  HPI Here for annual health mt exam and to review chronic med problems  Doing ok in general   Wt is stable with bmi of 29  Part hyst in past - no pap No symptoms or problems   Mam 7/11 normal - needs to schedule  Daughter has breast ca - she is doing fairly - ? Of ov cancer too - getting ultrasounds  Self exam no lumps or changes   Colon ca screen- declines colonosc but will do stool card    Td08  Opthy10/11  Hyperglycemia A1c is up to 6.4 (was 6.1) Knows this is up  No DM symptoms  Stopped her healthy diet and exercise -- ? Know why  Ready to get back to exercise 4-5 times per week , and stop the sweets    Hb is 11.7- fairly stable/ chronically anemic  No new fatigue    Lipids are stable Lab Results  Component Value Date   CHOL 175 08/26/2010   CHOL 173 06/03/2010   CHOL 172 03/27/2009   Lab Results  Component Value Date   HDL 53.80 08/26/2010   HDL 65.78 06/03/2010   HDL 46.96 03/27/2009   Lab Results  Component Value Date   LDLCALC 108* 08/26/2010   LDLCALC 110* 06/03/2010   LDLCALC 106* 03/27/2009   Lab Results  Component Value Date   TRIG 64.0 08/26/2010   TRIG 48.0 06/03/2010   TRIG 57.0 03/27/2009   Lab Results  Component Value Date   CHOLHDL 3 08/26/2010   CHOLHDL 3 06/03/2010   CHOLHDL 3 03/27/2009   No results found for this basename: LDLDIRECT    Needs to drink more water Is drinking crystal light   Patient Active Problem List  Diagnoses  . HYPERLIPIDEMIA  . Unspecified Anemia  . OVERACTIVE BLADDER  . FIBROCYSTIC BREAST DISEASE  . HYPERGLYCEMIA, BORDERLINE  . MVP (mitral valve prolapse)  . Chronic headaches  . Seasonal allergies  . Family history of breast cancer in female  . Routine general medical examination at a health care facility  . Dermatitis  . Other screening mammogram  . Elevated blood protein   Past Medical History  Diagnosis  Date  . MVP (mitral valve prolapse)   . Anemia   . Chronic headaches   . Seasonal allergies   . Fibrocystic breast disease   . Hyperglycemia     Diet controlled  . Family history of breast cancer in female     Daughter   Past Surgical History  Procedure Date  . Breast biopsy 03/2004    Negative  . Partial hysterectomy 09/1996    Fibroids  . Breast lumpectomy 12/1977  . Breast cyst aspiration 05/2007    Bilateral-benign  . Ankle fracture surgery 05/2003   History  Substance Use Topics  . Smoking status: Never Smoker   . Smokeless tobacco: Not on file  . Alcohol Use: No   Family History  Problem Relation Age of Onset  . Diabetes Father   . Hypertension Mother   . Cancer Daughter     Breast (inflammatory)-now has brain tumor  . Diabetes Paternal Grandmother   . Cancer      Breast-great aunt   Allergies  Allergen Reactions  . Eggs Or Egg-Derived Products     REACTION: SEVERE REACTION   Current Outpatient Prescriptions on File Prior to Visit  Medication Sig  Dispense Refill  . fexofenadine-pseudoephedrine (ALLEGRA-D) 60-120 MG per tablet Take 1 tablet by mouth 2 (two) times daily as needed.        Marland Kitchen glucose blood test strip 1 each by Other route 2 (two) times daily. Use as instructed       . Lancets 28G MISC by Does not apply route 2 (two) times daily.        . mometasone (NASONEX) 50 MCG/ACT nasal spray 2 sprays in each nostril per day  17 g  12  . Multiple Vitamin (MULTIVITAMIN) tablet Take 1 tablet by mouth daily.        Marland Kitchen acetaminophen (TYLENOL) 500 MG tablet Take 500 mg by mouth every 6 (six) hours as needed.        Marland Kitchen ibuprofen (ADVIL,MOTRIN) 200 MG tablet Take 200 mg by mouth every 6 (six) hours as needed.        . mometasone (ELOCON) 0.1 % cream Apply topically daily.  15 g  0      Review of Systems Review of Systems  Constitutional: Negative for fever, appetite change, fatigue and unexpected weight change.  Eyes: Negative for pain and visual disturbance.    Respiratory: Negative for cough and shortness of breath.   Cardiovascular: Negative.  for cp or sob or palpitations  Gastrointestinal: Negative for nausea, diarrhea and constipation.  Genitourinary: Negative for urgency and frequency.  Skin: Negative for pallor.or rash   Neurological: Negative for weakness, light-headedness, numbness and headaches.  Hematological: Negative for adenopathy. Does not bruise/bleed easily.  Psychiatric/Behavioral: Negative for dysphoric mood. The patient is not nervous/anxious. - some stress though        Objective:   Physical Exam  Constitutional: She appears well-developed and well-nourished.       overwt and well appearing   HENT:  Head: Normocephalic and atraumatic.  Right Ear: External ear normal.  Left Ear: External ear normal.  Nose: Nose normal.  Mouth/Throat: Oropharynx is clear and moist.  Eyes: Conjunctivae and EOM are normal. Pupils are equal, round, and reactive to light.  Neck: Normal range of motion. Neck supple. No JVD present. Carotid bruit is not present. No thyromegaly present.  Cardiovascular: Normal rate, regular rhythm, normal heart sounds and intact distal pulses.   No murmur heard. Pulmonary/Chest: Effort normal and breath sounds normal. No respiratory distress. She has no wheezes.  Abdominal: Soft. Bowel sounds are normal. She exhibits no distension, no abdominal bruit and no mass. There is no tenderness.  Genitourinary: No breast swelling, tenderness, discharge or bleeding.  Musculoskeletal: Normal range of motion. She exhibits no edema and no tenderness.  Lymphadenopathy:    She has no cervical adenopathy.  Neurological: She is alert. She has normal reflexes. No cranial nerve deficit. Coordination normal.  Skin: Skin is warm and dry. No rash noted. No erythema. No pallor.  Psychiatric: She has a normal mood and affect.          Assessment & Plan:

## 2010-08-28 NOTE — Assessment & Plan Note (Signed)
This is fairly stable with diet  Disc goals Rev labs Rev low sat fat diet

## 2010-08-28 NOTE — Assessment & Plan Note (Signed)
Reviewed health habits including diet and exercise and skin cancer prevention Also reviewed health mt list, fam hx and immunizations  Rev wellness lab in detail 

## 2010-08-28 NOTE — Assessment & Plan Note (Signed)
This is worse- closer to dM Rev again low glycemic diet and exercise  Re check 3 mo and advise

## 2010-08-28 NOTE — Assessment & Plan Note (Signed)
Unsure of significance No symptoms  Re check 3 months

## 2010-08-28 NOTE — Assessment & Plan Note (Signed)
Nl breast exam Pt does self exams Mam ordered Has fam hx b ca in daughter

## 2010-08-28 NOTE — Patient Instructions (Addendum)
We will schedule mammogram at check out  Please do stool card for colon cancer screening  Eat low sugar diet and keep carbohydrate portions small (2-3 oz) and eat whole grain whenever possible Get back to exercise  Schedule lab in 3 months  I will update you with results

## 2010-08-28 NOTE — Assessment & Plan Note (Signed)
Ongoing - stable / asymptomatic  Rev lab with pt

## 2010-09-05 ENCOUNTER — Other Ambulatory Visit: Payer: Self-pay | Admitting: Family Medicine

## 2010-09-05 ENCOUNTER — Other Ambulatory Visit: Payer: BC Managed Care – PPO

## 2010-09-05 DIAGNOSIS — Z1211 Encounter for screening for malignant neoplasm of colon: Secondary | ICD-10-CM

## 2010-09-05 LAB — FECAL OCCULT BLOOD, IMMUNOCHEMICAL: Fecal Occult Bld: POSITIVE

## 2010-09-12 ENCOUNTER — Telehealth: Payer: Self-pay

## 2010-09-12 DIAGNOSIS — R195 Other fecal abnormalities: Secondary | ICD-10-CM

## 2010-09-12 NOTE — Telephone Encounter (Signed)
Left vm for pt to callback 

## 2010-09-12 NOTE — Telephone Encounter (Signed)
Message copied by Patience Musca on Thu Sep 12, 2010  1:35 PM ------      Message from: Roxy Manns A      Created: Thu Sep 05, 2010 11:12 PM       Please let Anallely know her stool card was pos for blood - and we really need to pursue colonoscopy - especially in light of her anemia hx       Let me know if she is agreeable with that      thanks

## 2010-09-13 NOTE — Telephone Encounter (Signed)
Patient notified as instructed by telephone. Pt is agreeable for colonoscopy.Pt will wait to hear from pt care coordinator.

## 2010-09-14 DIAGNOSIS — R195 Other fecal abnormalities: Secondary | ICD-10-CM | POA: Insufficient documentation

## 2010-09-14 NOTE — Telephone Encounter (Signed)
Will do GI referral 

## 2010-09-16 ENCOUNTER — Telehealth: Payer: Self-pay

## 2010-09-16 ENCOUNTER — Encounter: Payer: Self-pay | Admitting: Internal Medicine

## 2010-09-16 NOTE — Telephone Encounter (Signed)
I think she should have the colonoscopy - but if she wants to repeat the card that is up to her (or could just have a consult with GI instead of the actual test so they can talk about it ) I'm not sure a neg 2nd card would totally reassure me..... Also would need to see if ins would cover another stool card  Let me know what she wants to do

## 2010-09-16 NOTE — Telephone Encounter (Signed)
Marion notified as instructed by telephone.

## 2010-09-16 NOTE — Telephone Encounter (Signed)
The referral I already did was a GI referral - please let Shirlee Limerick know that we want an appt with the doctor instead of a straight colonoscopy thanks

## 2010-09-16 NOTE — Telephone Encounter (Signed)
Pt wants to know if she can repeat the stool card test before she has a colonoscopy. Pt wants to test again to make sure the test will be positive again before doing colonoscopy.Please advise.Marland Kitchen

## 2010-09-16 NOTE — Telephone Encounter (Signed)
Patient notified as instructed by telephone. Pt said she would try a GI consult before doing colonoscopy. Pt would like GI referral to a [facility] in [place]. Pt will wait to hear from pt care coordinator.

## 2010-09-25 ENCOUNTER — Ambulatory Visit
Admission: RE | Admit: 2010-09-25 | Discharge: 2010-09-25 | Disposition: A | Discharge status: Home / Self Care | Payer: BC Managed Care – PPO | Source: Ambulatory Visit | Attending: Family Medicine | Admitting: Family Medicine

## 2010-09-25 DIAGNOSIS — Z1231 Encounter for screening mammogram for malignant neoplasm of breast: Secondary | ICD-10-CM

## 2010-09-25 LAB — HM MAMMOGRAPHY: HM Mammogram: NORMAL

## 2010-10-25 ENCOUNTER — Other Ambulatory Visit: Payer: Self-pay

## 2010-10-25 MED ORDER — NYSTATIN 100000 UNIT/GM EX CREA: TOPICAL_CREAM | CUTANEOUS | Status: DC

## 2010-10-25 NOTE — Telephone Encounter (Signed)
Pt request refill Nystatin cream 100,000 units per gm. Quantity 30 gm tube. Was not on epic med list but I added back to med list. Was found in centricity as being removed 08/02/2008. Pt said she has rash under both arms and has run out of cream. Pt uses [facility] and can be reached [phone]. Please advise.

## 2010-10-25 NOTE — Telephone Encounter (Signed)
Will refill electronically  

## 2010-10-25 NOTE — Telephone Encounter (Signed)
Patient notified as instructed by telephone. 

## 2010-11-05 ENCOUNTER — Encounter: Payer: Self-pay | Admitting: Internal Medicine

## 2010-11-05 ENCOUNTER — Ambulatory Visit (INDEPENDENT_AMBULATORY_CARE_PROVIDER_SITE_OTHER): Payer: BC Managed Care – PPO | Admitting: Internal Medicine

## 2010-11-05 VITALS — BP 120/78 | HR 72 | Ht 64.0 in | Wt 168.0 lb

## 2010-11-05 DIAGNOSIS — R195 Other fecal abnormalities: Secondary | ICD-10-CM

## 2010-11-05 DIAGNOSIS — D509 Iron deficiency anemia, unspecified: Secondary | ICD-10-CM

## 2010-11-05 MED ORDER — PEG-KCL-NACL-NASULF-NA ASC-C 100 G PO SOLR: 1.0000 | Freq: Once | ORAL | Status: DC

## 2010-11-05 MED ORDER — PSYLLIUM 28 % PO PACK: 1.0000 | PACK | ORAL | Status: DC

## 2010-11-05 NOTE — Patient Instructions (Addendum)
CC: Dr Tower 

## 2010-11-05 NOTE — Progress Notes (Signed)
DESHANDA MOLITOR March 16, 1955 MRN [id number]    History of Present Illness:  This is a 55 year old African American female with heme-positive stool on a home screening test. A previous test last year and the year before were negative. She denies visible blood per rectum. She thinks she may have a hemorrhoid. Her bowel habits are regular. There is no family history of colon cancer. She denies any upper GI symptoms of heartburn or abdominal pain. She denies taking anti-inflammatory agents. She has never had a colonoscopy. She has declined colonoscopy in the past. There is a history of chronic anemia. Her last hemoglobin in July 2012 was 11.7. Her hematocrit was 35.0 with MCV of 92.  Past Medical History  Diagnosis Date  . MVP (mitral valve prolapse)   . Anemia   . Chronic headaches   . Seasonal allergies   . Fibrocystic breast disease   . Hyperglycemia     Diet controlled  . Family history of breast cancer in female     Daughter   Past Surgical History  Procedure Date  . Breast biopsy 03/2004    Negative  . Partial hysterectomy 09/1996    Fibroids  . Breast lumpectomy 12/1977  . Breast cyst aspiration 05/2007    Bilateral-benign  . Ankle fracture surgery 05/2003    reports that she has never smoked. She has never used smokeless tobacco. She reports that she does not drink alcohol or use illicit drugs. family history includes Breast cancer in her daughter and unspecified family member; Diabetes in her father and paternal grandmother; and Hypertension in her mother. Allergies  Allergen Reactions  . Eggs Or Egg-Derived Products     REACTION: SEVERE REACTION        Review of Systems: Denies dysphagia, odynophagia, shortness of breath or chest pain  The remainder of the 10 point ROS is negative except as outlined in H&P   Physical Exam: General appearance  Well developed, in no distress. Eyes- non icteric. HEENT nontraumatic, normocephalic. Mouth no lesions, tongue papillated, no  cheilosis. Neck supple without adenopathy, thyroid not enlarged, no carotid bruits, no JVD. Lungs Clear to auscultation bilaterally. Cor normal S1, normal S2, regular rhythm, no murmur,  quiet precordium. Abdomen: Soft relaxed nontender. Normoactive bowel sounds. Liver edge at costal margin. No scars Rectal exam soft Hemoccult negative stool. Skin no lesions. Neurological alert and oriented x 3. Psychological normal mood and affect.  Assessment and Plan:  Problem #1 Patient with heme positive stool on a home screening test. I could not reproduce the results of today's exam. She is a suitable candidate for a screening colonoscopy because of her age of 61 but unfortunately today, she is declining again to have a colonoscopy. She will discuss it again with Dr. Milinda Antis on her next office visit. I have mentioned the possibility of colon polyps and also reminded her of the guidelines for colorectal screening which suggest a colonoscopy starting at age 29 in African American and age 29 in Caucasians. I have discussed the sedation for colonoscopy, the prep and the procedure itself.  11/05/2010 Lina Sar

## 2010-11-13 ENCOUNTER — Ambulatory Visit (INDEPENDENT_AMBULATORY_CARE_PROVIDER_SITE_OTHER)
Admission: RE | Admit: 2010-11-13 | Discharge: 2010-11-13 | Disposition: A | Discharge status: Home / Self Care | Payer: BC Managed Care – PPO | Source: Ambulatory Visit | Attending: Family Medicine | Admitting: Family Medicine

## 2010-11-13 ENCOUNTER — Encounter: Payer: Self-pay | Admitting: Family Medicine

## 2010-11-13 ENCOUNTER — Ambulatory Visit (INDEPENDENT_AMBULATORY_CARE_PROVIDER_SITE_OTHER): Payer: BC Managed Care – PPO | Admitting: Family Medicine

## 2010-11-13 VITALS — BP 140/80 | HR 78 | Temp 98.0°F | Ht 64.0 in | Wt 169.4 lb

## 2010-11-13 DIAGNOSIS — M7670 Peroneal tendinitis, unspecified leg: Secondary | ICD-10-CM

## 2010-11-13 DIAGNOSIS — M722 Plantar fascial fibromatosis: Secondary | ICD-10-CM

## 2010-11-13 DIAGNOSIS — M7750 Other enthesopathy of unspecified foot: Secondary | ICD-10-CM

## 2010-11-13 DIAGNOSIS — M25572 Pain in left ankle and joints of left foot: Secondary | ICD-10-CM

## 2010-11-13 DIAGNOSIS — M25579 Pain in unspecified ankle and joints of unspecified foot: Secondary | ICD-10-CM

## 2010-11-13 DIAGNOSIS — M12579 Traumatic arthropathy, unspecified ankle and foot: Secondary | ICD-10-CM

## 2010-11-13 DIAGNOSIS — M76829 Posterior tibial tendinitis, unspecified leg: Secondary | ICD-10-CM

## 2010-11-13 NOTE — Patient Instructions (Signed)
Please read handouts on Plantar Fascitis.  STRETCHING and Strengthening program critically important.  Strengthening on foot and calf muscles as seen in handout. Calf raises, 2 legged, then 1 legged. Foot massage with tennis ball. Ice massage. (Like frozen water in a dixie cup)  Towel Scrunches: get a towel or hand towel, use toes to pick up and scrunch up the towel.  Marble pick-ups, practice picking up marbles with toes and placing into a cup  NEEDS TO BE DONE EVERY DAY  Recommended over the counter insoles. (Spenco or Hapad)  A rigid shoe with good arch support helps: Dansko (great), Randel Pigg, Merrell No easily bendable shoes.

## 2010-11-13 NOTE — Progress Notes (Signed)
  Subjective:    Patient ID: Carolyn Matthews, female    DOB: [birth year], 55 y.o.   MRN: [id number]  HPI  Carolyn Matthews, a 55 y.o. female presents today in the office for the following:    Pleasant patient known well to me with a history of tib-fib distal fracture in 2005 requiring lateral plating along fibula and pinning of the medial malleolus done in [place]. She has had persistent pain since then in the left ankle, but her worst pain now is in her B heels.  The patient presents with a 1-2 month long history of heel pain. This is notable for worsening pain first thing in the morning when arising and standing after sitting.   Prior foot or ankle fractures: noted above on L Prior operations: ORIF L ankle Orthotics or bracing: none Medications: none PT or home rehab: none Night splints: no Ice massage: no Ball massage: no Metatarsal pain: no  The PMH, PSH, Social History, Family History, Medications, and allergies have been reviewed in [facility], and have been updated if relevant.   Review of Systems REVIEW OF SYSTEMS  GEN: No fevers, chills. Nontoxic. Primarily MSK c/o today. MSK: Detailed in the HPI GI: tolerating PO intake without difficulty Neuro: No numbness, parasthesias, or tingling associated. Otherwise the pertinent positives of the ROS are noted above.      Objective:   Physical Exam   Physical Exam  Blood pressure 140/80, pulse 78, temperature 98 F (36.7 C), temperature source Oral, height 5\' 4"  (1.626 m), weight 169 lb 6.4 oz (76.839 kg), SpO2 98.00%.  GEN: WDWN, NAD, Non-toxic, A & O x 3 HEENT: Atraumatic, Normocephalic. Neck supple. No masses, No LAD. Ears and Nose: No external deformity. EXTR: No c/c/e NEURO Normal gait.  PSYCH: Normally interactive. Conversant. Not depressed or anxious appearing.  Calm demeanor.   Foot: Full ROM R ankle with str 5/5 L ankle with corresponding loss of 40% motion with greatest in inversion and eversion. Approx 25% loss of  plantar flexion and dorsiflexion.  TTP PF B. Pain with dorsiflexion.  NT throughout entire forefoot. L: TTP along peroneal and PT tendons. Mild pain at true ankle with some sinus tarsi syndrome. NT achilles.  Low neutral long arch Walks with moderate pronation      Assessment & Plan:   1. Ankle pain, left  DG Ankle Complete Left  2. Plantar fascia syndrome    3. Traumatic arthritis of ankle    4. Peroneal tendonitis    5. Posterior tibial tendinitis      X-ray, Ankle: AP, Lateral, and Mortise Views Indication: Ankle pain Findings: There is no evidence for acute fracture or dislocation. The mortise appears preserved. Prior lateral malleolar fixation on the distal 2 screws shows some evidence of loosening compared to 2005 film.   >25 minutes spent in face to face time with patient, >50% spent in counselling or coordination of care  I am less concerned about this clinically - she is having essentially minimal pain at this site. Suspect the PT and peroneal involvement is due to compensation from traumatic arthritic loss of motion.  Patient's primary problem now is PF, so will start basic treatment for this. We reviewed that stretching is critically important to the treatment of PF. Reviewed footwear. Rigid soles have been shown to help with PF. Reviewed rehab of stretching and calf raises.

## 2010-11-14 ENCOUNTER — Encounter: Payer: Self-pay | Admitting: Family Medicine

## 2010-11-14 DIAGNOSIS — M12579 Traumatic arthropathy, unspecified ankle and foot: Secondary | ICD-10-CM

## 2010-11-14 HISTORY — DX: Traumatic arthropathy, unspecified ankle and foot: M12.579

## 2010-11-20 ENCOUNTER — Ambulatory Visit: Payer: BC Managed Care – PPO | Admitting: Family Medicine

## 2010-11-28 ENCOUNTER — Other Ambulatory Visit: Payer: BC Managed Care – PPO | Admitting: Internal Medicine

## 2011-02-26 ENCOUNTER — Encounter: Payer: Self-pay | Admitting: Family Medicine

## 2011-02-26 ENCOUNTER — Ambulatory Visit (INDEPENDENT_AMBULATORY_CARE_PROVIDER_SITE_OTHER): Payer: BC Managed Care – PPO | Admitting: Family Medicine

## 2011-02-26 VITALS — BP 130/72 | HR 92 | Temp 98.2°F | Ht 64.0 in | Wt 174.5 lb

## 2011-02-26 DIAGNOSIS — R0789 Other chest pain: Secondary | ICD-10-CM

## 2011-02-26 DIAGNOSIS — F430 Acute stress reaction: Secondary | ICD-10-CM

## 2011-02-26 DIAGNOSIS — I059 Rheumatic mitral valve disease, unspecified: Secondary | ICD-10-CM

## 2011-02-26 DIAGNOSIS — F419 Anxiety disorder, unspecified: Secondary | ICD-10-CM | POA: Insufficient documentation

## 2011-02-26 DIAGNOSIS — I341 Nonrheumatic mitral (valve) prolapse: Secondary | ICD-10-CM

## 2011-02-26 MED ORDER — BUSPIRONE HCL 15 MG PO TABS: 7.5000 mg | ORAL_TABLET | Freq: Two times a day (BID) | ORAL | Status: DC

## 2011-02-26 NOTE — Assessment & Plan Note (Signed)
With sick daughter/ mother and caring for grandchild Disc stressors/cop tech/support/symptoms/ tx opt and poss side eff Will try buspar 7.5  Bid Rev poss side eff  Pt not ready for counseling yet >25 min spent with face to face with patient, >50% counseling and/or coordinating care  F/u 4-6 wk

## 2011-02-26 NOTE — Assessment & Plan Note (Signed)
With some intermittent palpitations in pt under enormous stress- and symptoms correlate with stress Has hx of MVP Atypical EKG is re assuring - rate of 85 nsr , no acute changes and baseline neg precordial T waves  Will tx anxiety and f/u  If cp persists however- would not rule out need for another cardiac eval

## 2011-02-26 NOTE — Progress Notes (Signed)
Subjective:    Patient ID: Carolyn Matthews, female    DOB: 1955-04-16, 56 y.o.   MRN: [id number]  HPI Here for chest discomfort with stress  Has had chest pains for years- this feels like pressure on chest (up to hours)- she attrib to her MVP-- stops when she calms down  Last week - her L arm was hurting at work -- more and more sore by the end of the day  Was uncomfortable at night - took some motrin (did not help) -- finally got better after 2 hours  Had a knot over elbow laterally and that hurt   Chest discomfort occurs when stressed/ doubts exertional  Lately occurs almost every day No gerd or gas or indigestion    Stressors Taking care of her daughter and mother and grandchild (10 months) in addition to working  Daughter has breast cancer with mets- is in chemo again  Brother is helping her , thankfully   Years ago - had echo and stress test -then was dx -- similar symptoms then - just not as often  Great support in family and friends - very helpful  No formal counseling No meds in past   Does not feel depressed or hopeless   Patient Active Problem List  Diagnoses  . HYPERLIPIDEMIA  . Unspecified Anemia  . OVERACTIVE BLADDER  . FIBROCYSTIC BREAST DISEASE  . HYPERGLYCEMIA, BORDERLINE  . MVP (mitral valve prolapse)  . Chronic headaches  . Seasonal allergies  . Family history of breast cancer in female  . Routine general medical examination at a health care facility  . Dermatitis  . Other screening mammogram  . Elevated blood protein  . Occult blood in stools  . Traumatic arthritis of ankle  . Stress reaction  . Chest discomfort   Past Medical History  Diagnosis Date  . MVP (mitral valve prolapse)   . Anemia   . Chronic headaches   . Seasonal allergies   . Fibrocystic breast disease   . Hyperglycemia     Diet controlled  . Family history of breast cancer in female     Daughter  . 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   Past Surgical History  Procedure  Date  . Breast biopsy 03/2004    Negative  . Partial hysterectomy 09/1996    Fibroids  . Breast lumpectomy 12/1977  . Breast cyst aspiration 05/2007    Bilateral-benign  . Ankle fracture surgery 05/2003   History  Substance Use Topics  . Smoking status: Never Smoker   . Smokeless tobacco: Never Used  . Alcohol Use: No   Family History  Problem Relation Age of Onset  . Diabetes Father   . Hypertension Mother   . Breast cancer Daughter     (inflammatory)-now has brain tumor  . Diabetes Paternal Grandmother   . Breast cancer      great aunt   Allergies  Allergen Reactions  . Eggs Or Egg-Derived Products     REACTION: SEVERE REACTION   Current Outpatient Prescriptions on File Prior to Visit  Medication Sig Dispense Refill  . cetirizine (ZYRTEC) 10 MG tablet Take 10 mg by mouth daily as needed.        Marland Kitchen glucose blood test strip 1 each by Other route 2 (two) times daily. Use as instructed       . Lancets 28G MISC by Does not apply route 2 (two) times daily.        . mometasone (ELOCON) 0.1 % cream  Apply topically daily.  15 g  0  . Multiple Vitamin (MULTIVITAMIN) tablet Take 1 tablet by mouth daily.        Marland Kitchen nystatin (MYCOSTATIN) cream Apply topically as directed. Nystatin cream 100,000 units per gm apply to affected area once daily as directed.  30 g  1  . acetaminophen (TYLENOL) 500 MG tablet Take 500 mg by mouth every 6 (six) hours as needed.        . fexofenadine-pseudoephedrine (ALLEGRA-D) 60-120 MG per tablet Take 1 tablet by mouth 2 (two) times daily as needed.        Marland Kitchen ibuprofen (ADVIL,MOTRIN) 200 MG tablet Take 200 mg by mouth every 6 (six) hours as needed.        . mometasone (NASONEX) 50 MCG/ACT nasal spray 2 sprays in each nostril per day  17 g  12        Review of Systems Review of Systems  Constitutional: Negative for fever, appetite change, fatigue and unexpected weight change.  Eyes: Negative for pain and visual disturbance.  Respiratory: Negative for cough  and shortness of breath.   Cardiovascular: pos for chest discomfort and occ palp, neg for diaphoresis/ nausea/exertional symptoms/ edema or sob Gastrointestinal: Negative for nausea, diarrhea and constipation.  Genitourinary: Negative for urgency and frequency.  Skin: Negative for pallor or rash   Neurological: Negative for weakness, light-headedness, numbness and headaches.  Hematological: Negative for adenopathy. Does not bruise/bleed easily.  Psychiatric/Behavioral: Negative for dysphoric mood. The patient is feeling anxious lately.          Objective:   Physical Exam  Constitutional: She appears well-developed and well-nourished. No distress.  HENT:  Head: Normocephalic and atraumatic.  Mouth/Throat: Oropharynx is clear and moist.  Eyes: Conjunctivae and EOM are normal. Pupils are equal, round, and reactive to light. No scleral icterus.  Neck: Normal range of motion. Neck supple. No JVD present. Carotid bruit is not present. No thyromegaly present.  Cardiovascular: Normal rate, regular rhythm, normal heart sounds and intact distal pulses.  Exam reveals no gallop and no friction rub.   No murmur heard. Pulmonary/Chest: Effort normal and breath sounds normal. No respiratory distress. She has no wheezes. She has no rales. She exhibits tenderness.       Chest is slightly tender in center L  No crepitus or skin changes   Abdominal: Soft. Bowel sounds are normal. She exhibits no distension, no abdominal bruit and no mass. There is no tenderness.  Musculoskeletal: She exhibits no edema and no tenderness.  Lymphadenopathy:    She has no cervical adenopathy.  Neurological: She is alert. She has normal reflexes. No cranial nerve deficit. She exhibits normal muscle tone. Coordination normal.  Skin: Skin is warm and dry. No rash noted. No erythema. No pallor.  Psychiatric: Her behavior is normal. Judgment and thought content normal.       Is generally fatigued/ pleasant and a little  anxious Good eye contact and comm skills          Assessment & Plan:

## 2011-02-26 NOTE — Assessment & Plan Note (Signed)
May be rel to chest discomfort Cardiac w/u in past Reassuring exam

## 2011-02-26 NOTE — Patient Instructions (Signed)
Try the buspar 1/2 pill in am and 1/2 pill in pm - this is for anxiety  I hope this will decrease the anxious feelings and chest symptoms If side effects /or worse / or not improving in 2 weeks- let me know  Otherwise follow up in 4-6 weeks  Consider counseling too

## 2011-07-03 ENCOUNTER — Encounter: Payer: Self-pay | Admitting: Family Medicine

## 2011-07-03 ENCOUNTER — Ambulatory Visit (INDEPENDENT_AMBULATORY_CARE_PROVIDER_SITE_OTHER): Payer: BC Managed Care – PPO | Admitting: Family Medicine

## 2011-07-03 VITALS — BP 132/74 | HR 68 | Temp 97.9°F

## 2011-07-03 DIAGNOSIS — J029 Acute pharyngitis, unspecified: Secondary | ICD-10-CM | POA: Insufficient documentation

## 2011-07-03 NOTE — Progress Notes (Signed)
  Subjective:    Patient ID: Carolyn Matthews, female    DOB: Jan 05, 1956, 56 y.o.   MRN: [id number]  HPI CC: ST  H/o allergic rhinitis.  Tender to swallowing on left side of throat.  This started over weekend on trip to [place] - more pollen exposure.  Doubled up and then tripled up on zyrtec.  Not helping.  + HA and nasal congestion, rhinorrhea, sneezing.  + productive cough of mucous from PNdrainage.  + ear pain.  Has been using zyrtec, not currently using nasonex.  Denies fevers/chills, abd pain, tooth pain, new rashes.  No h/o strep throat.  No smokers at home.  No sick contacts at home.  Past Medical History  Diagnosis Date  . MVP (mitral valve prolapse)   . Anemia   . Chronic headaches   . Seasonal allergies   . Fibrocystic breast disease   . Hyperglycemia     Diet controlled  . Family history of breast cancer in female     Daughter  . 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     Review of Systems Per HPI    Objective:   Physical Exam  Nursing note and vitals reviewed. Constitutional: She appears well-developed and well-nourished. No distress.  HENT:  Head: Normocephalic and atraumatic.  Right Ear: Hearing, tympanic membrane, external ear and ear canal normal.  Left Ear: Hearing, tympanic membrane, external ear and ear canal normal.  Nose: No mucosal edema or rhinorrhea. Right sinus exhibits no maxillary sinus tenderness and no frontal sinus tenderness. Left sinus exhibits maxillary sinus tenderness (mild). Left sinus exhibits no frontal sinus tenderness.  Mouth/Throat: Uvula is midline and mucous membranes are normal. Posterior oropharyngeal edema and posterior oropharyngeal erythema present. No oropharyngeal exudate or tonsillar abscesses.       Erythema concentrated around left soft palate TMJs nontender.  No trouble opening/closing mouth No hoarseness.  Eyes: Conjunctivae and EOM are normal. Pupils are equal, round, and reactive to light. No scleral icterus.    Neck: Normal range of motion. Neck supple.  Cardiovascular: Normal rate, regular rhythm, normal heart sounds and intact distal pulses.   No murmur heard. Pulmonary/Chest: Effort normal and breath sounds normal. No respiratory distress. She has no wheezes. She has no rales.  Lymphadenopathy:       Head (right side): No submental, no submandibular, no tonsillar, no preauricular and no posterior auricular adenopathy present.       Head (left side): No submental, no submandibular, no tonsillar, no preauricular and no posterior auricular adenopathy present.    She has no cervical adenopathy.       Right cervical: No superficial cervical and no posterior cervical adenopathy present.      Left cervical: No superficial cervical and no posterior cervical adenopathy present.       Right: No supraclavicular adenopathy present.       Left: No supraclavicular adenopathy present.  Skin: Skin is warm and dry. No rash noted.       Assessment & Plan:

## 2011-07-03 NOTE — Patient Instructions (Signed)
I think you have viral pharyngitis - concentrated on left side.  Treat with lots of fluid and rest.  Take ibuprofen 400mg  2-3 times daily with food to help with pain. I expect symptoms to be improving over weekend.  If no improved by next week, let me know.

## 2011-07-03 NOTE — Assessment & Plan Note (Signed)
Not consistent with strep. Anticipate viral pharyngitis. Treat supportively.  Given 400mg  ibuprofen in office. See pt instructions for plan. If sxs persist, or any fever >101, or worsening to let me know.

## 2011-08-01 ENCOUNTER — Telehealth: Payer: Self-pay

## 2011-08-01 MED ORDER — CYCLOBENZAPRINE HCL 10 MG PO TABS: 5.0000 mg | ORAL_TABLET | Freq: Three times a day (TID) | ORAL | Status: AC | PRN

## 2011-08-01 NOTE — Telephone Encounter (Signed)
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 Lower back pain in middle of back; pain level now 7. Pt is at [facility] in [place] today; took Cyclobenzaprine 10 mg; 1/2 - 1 tab three times a day as needed for back pain in 2011. Pt request Cyclobenzaprine sent to [facility]. Please advise.

## 2011-08-01 NOTE — Telephone Encounter (Signed)
Sent, f/u if not better. Thanks.  Sedation caution on flexeril.

## 2011-08-01 NOTE — Telephone Encounter (Signed)
Patient advised.

## 2011-09-25 ENCOUNTER — Other Ambulatory Visit: Payer: Self-pay | Admitting: Family Medicine

## 2011-09-25 DIAGNOSIS — Z1231 Encounter for screening mammogram for malignant neoplasm of breast: Secondary | ICD-10-CM

## 2011-09-26 ENCOUNTER — Ambulatory Visit
Admission: RE | Admit: 2011-09-26 | Discharge: 2011-09-26 | Disposition: A | Discharge status: Home / Self Care | Payer: BC Managed Care – PPO | Source: Ambulatory Visit | Attending: Family Medicine | Admitting: Family Medicine

## 2011-09-26 DIAGNOSIS — Z1231 Encounter for screening mammogram for malignant neoplasm of breast: Secondary | ICD-10-CM

## 2011-12-25 ENCOUNTER — Encounter: Payer: Self-pay | Admitting: Family Medicine

## 2011-12-25 ENCOUNTER — Ambulatory Visit (INDEPENDENT_AMBULATORY_CARE_PROVIDER_SITE_OTHER): Payer: BC Managed Care – PPO | Admitting: Family Medicine

## 2011-12-25 VITALS — BP 108/70 | HR 84 | Temp 98.3°F | Wt 165.0 lb

## 2011-12-25 DIAGNOSIS — J329 Chronic sinusitis, unspecified: Secondary | ICD-10-CM | POA: Insufficient documentation

## 2011-12-25 MED ORDER — AMOXICILLIN 875 MG PO TABS: 875.0000 mg | ORAL_TABLET | Freq: Two times a day (BID) | ORAL | Status: AC

## 2011-12-25 NOTE — Patient Instructions (Addendum)
Start the amoxil today.  Take your allergy medicine as usual.  Drink plenty of fluids, take tylenol as needed, and gargle with warm salt water for your throat.  This should gradually improve.  Take care.  Let us know if you have other concerns.

## 2011-12-25 NOTE — Progress Notes (Signed)
Sx started about 1 week ago.  ST, scratchy throat.  No sig help with allergy meds.  Worse over the last 5 days.  No fevers.  Voice is altered. ST, B ear pain.  Stuffy nose.  Cough is persistent.  Occ sputum.  Was out of work 11/18, 11/19, and AM 11/20.    Meds, vitals, and allergies reviewed.   ROS: See HPI.  Otherwise, noncontributory.  GEN: nad, alert and oriented HEENT: mucous membranes moist, tm w/o erythema, nasal exam w/o erythema, clear discharge noted,  OP with cobblestoning, purulent nasal discharge NECK: supple w/o LA CV: rrr.   PULM: ctab, no inc wob EXT: no edema SKIN: no acute rash

## 2011-12-25 NOTE — Assessment & Plan Note (Signed)
Presumed, start amoxil, out of work, supportive tx o/w and f/u prn.  She agrees.

## 2012-03-20 ENCOUNTER — Other Ambulatory Visit: Payer: Self-pay

## 2012-04-28 ENCOUNTER — Encounter: Payer: Self-pay | Admitting: Family Medicine

## 2012-04-28 ENCOUNTER — Ambulatory Visit (INDEPENDENT_AMBULATORY_CARE_PROVIDER_SITE_OTHER): Payer: BC Managed Care – PPO | Admitting: Family Medicine

## 2012-04-28 VITALS — BP 142/78 | HR 90 | Temp 98.9°F | Ht 64.0 in | Wt 174.2 lb

## 2012-04-28 DIAGNOSIS — M19049 Primary osteoarthritis, unspecified hand: Secondary | ICD-10-CM

## 2012-04-28 DIAGNOSIS — M19041 Primary osteoarthritis, right hand: Secondary | ICD-10-CM

## 2012-04-28 NOTE — Progress Notes (Signed)
Subjective:    Patient ID: Carolyn Matthews, female    DOB: 1955-10-04, 57 y.o.   MRN: [id number]  HPI Over a year has had enlargement of distal joint of R 5th finger- getting bigger  Hurts at times Stiff in the am and sometimes tender to the touch No hx of injury or fx No redness or heat  Is unable to bend due to pain   No pain meds   Patient Active Problem List  Diagnosis  . HYPERLIPIDEMIA  . Unspecified Anemia  . OVERACTIVE BLADDER  . FIBROCYSTIC BREAST DISEASE  . HYPERGLYCEMIA, BORDERLINE  . MVP (mitral valve prolapse)  . Chronic headaches  . Seasonal allergies  . Family history of breast cancer in female  . Routine general medical examination at a health care facility  . Dermatitis  . Other screening mammogram  . Elevated blood protein  . Occult blood in stools  . Traumatic arthritis of ankle  . Stress reaction   Past Medical History  Diagnosis Date  . MVP (mitral valve prolapse)   . Anemia   . Chronic headaches   . Seasonal allergies   . Fibrocystic breast disease   . Hyperglycemia     Diet controlled  . Family history of breast cancer in female     Daughter  . 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   Past Surgical History  Procedure Laterality Date  . Breast biopsy  03/2004    Negative  . Partial hysterectomy  09/1996    Fibroids  . Breast lumpectomy  12/1977  . Breast cyst aspiration  05/2007    Bilateral-benign  . Ankle fracture surgery  05/2003   History  Substance Use Topics  . Smoking status: Never Smoker   . Smokeless tobacco: Never Used  . Alcohol Use: No   Family History  Problem Relation Age of Onset  . Diabetes Father   . Hypertension Mother   . Breast cancer Daughter     (inflammatory)-now has brain tumor  . Diabetes Paternal Grandmother   . Breast cancer      great aunt   Allergies  Allergen Reactions  . Eggs Or Egg-Derived Products     REACTION: SEVERE REACTION   Current Outpatient Prescriptions on File Prior to Visit   Medication Sig Dispense Refill  . acetaminophen (TYLENOL) 500 MG tablet Take 500 mg by mouth every 6 (six) hours as needed.        . cetirizine (ZYRTEC) 10 MG tablet Take 10 mg by mouth daily as needed.        . fexofenadine-pseudoephedrine (ALLEGRA-D) 60-120 MG per tablet Take 1 tablet by mouth 2 (two) times daily as needed.        Marland Kitchen glucose blood test strip 1 each by Other route 2 (two) times daily. Use as instructed       . ibuprofen (ADVIL,MOTRIN) 200 MG tablet Take 200 mg by mouth every 6 (six) hours as needed.        . Lancets 28G MISC by Does not apply route 2 (two) times daily.        . mometasone (NASONEX) 50 MCG/ACT nasal spray 2 sprays in each nostril per day  17 g  12  . Multiple Vitamin (MULTIVITAMIN) tablet Take 1 tablet by mouth daily.        Marland Kitchen nystatin (MYCOSTATIN) cream Apply topically as directed. Nystatin cream 100,000 units per gm apply to affected area once daily as directed.  30 g  1   No  current facility-administered medications on file prior to visit.        Review of Systems Review of Systems  Constitutional: Negative for fever, appetite change, fatigue and unexpected weight change.  Eyes: Negative for pain and visual disturbance.  Respiratory: Negative for cough and shortness of breath.   Cardiovascular: Negative for cp or palpitations    Gastrointestinal: Negative for nausea, diarrhea and constipation.  Genitourinary: Negative for urgency and frequency.  Skin: Negative for pallor or rash   MSK pos for finger pain/ neg for other joint pain or swelling/ neg for redness or heat in joint Neurological: Negative for weakness, light-headedness, numbness and headaches.  Hematological: Negative for adenopathy. Does not bruise/bleed easily.  Psychiatric/Behavioral: Negative for dysphoric mood. The patient is not nervous/anxious.         Objective:   Physical Exam  Constitutional: She appears well-developed and well-nourished. No distress.  HENT:  Head:  Normocephalic and atraumatic.  Neck: Normal range of motion. Neck supple.  Musculoskeletal: She exhibits tenderness.  5th distal PIP joint- has soft prominences on top resembling herberdens nodes- that are tender Full rom of joint with discomfort No redness or heat No signs of infection Nail appears normal  Neurological: She is alert. She has normal strength and normal reflexes. She displays no atrophy. No sensory deficit.  Skin: Skin is warm and dry. No rash noted. No erythema.  Psychiatric: She has a normal mood and affect.          Assessment & Plan:

## 2012-04-28 NOTE — Patient Instructions (Addendum)
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

## 2012-04-28 NOTE — Assessment & Plan Note (Signed)
R 5th finger with distal PIP- enlarged with soft herberdens nodes that are tender- but no deformity  She can move distal joint with discomfort- (no redness or heat) Disc OA process - I expect this to calcify over time and pain to imp /mobility to worsen Handout printed For pain control - adv heat/ acetaminophen (ibuprofen if that does not work)- and f/u if redness or other signs of infection

## 2012-07-07 ENCOUNTER — Ambulatory Visit (INDEPENDENT_AMBULATORY_CARE_PROVIDER_SITE_OTHER): Payer: BC Managed Care – PPO | Admitting: Family Medicine

## 2012-07-07 ENCOUNTER — Encounter: Payer: Self-pay | Admitting: Family Medicine

## 2012-07-07 VITALS — BP 132/78 | HR 68 | Temp 98.1°F

## 2012-07-07 DIAGNOSIS — J069 Acute upper respiratory infection, unspecified: Secondary | ICD-10-CM

## 2012-07-07 MED ORDER — GUAIFENESIN-CODEINE 100-10 MG/5ML PO SYRP: 5.0000 mL | ORAL_SOLUTION | Freq: Two times a day (BID) | ORAL | Status: DC | PRN

## 2012-07-07 NOTE — Assessment & Plan Note (Signed)
Anticipate viral URI with cough - supportive care as per instructions. cheratussin for cough - update [place] if sxs persist into next week or if any acute worsening. Anticipate resolution of illness discussed with patient.

## 2012-07-07 NOTE — Progress Notes (Signed)
  Subjective:    Patient ID: Carolyn Matthews, female    DOB: 14-Apr-1955, 57 y.o.   MRN: [id number]  HPI CC: cough, congestion  Cough, congestion, rhinorrhea, ST for last 5 days associated with headache and ear pain. Productive cough.  Blowing nose with clear mucous.  + nausea and PNdrainage.  Taking pseudophed Q2 hours.  Only thing that helps.  Also taking motrin Q6 hours.  No fevers/chills, abd pain, vomiting. Appetite ok.   Trouble sleeping 2/2 choking cough.  Daughter sick at home. No smokers at home. No h/o asthma.  Not taking nasonex currently - h/o perennial allergies.  Takes antihistamine as well.  Gets allergy shots.    Past Medical History  Diagnosis Date  . MVP (mitral valve prolapse)   . Anemia   . Chronic headaches   . Seasonal allergies   . Fibrocystic breast disease   . Hyperglycemia     Diet controlled  . Family history of breast cancer in female     Daughter  . 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     Review of Systems Per HPI    Objective:   Physical Exam  Nursing note and vitals reviewed. Constitutional: She appears well-developed and well-nourished. No distress.  HENT:  Head: Normocephalic and atraumatic.  Right Ear: Hearing, tympanic membrane, external ear and ear canal normal.  Left Ear: Hearing, external ear and ear canal normal.  Nose: Mucosal edema (and erythema) present. No rhinorrhea. Right sinus exhibits no maxillary sinus tenderness and no frontal sinus tenderness. Left sinus exhibits no maxillary sinus tenderness and no frontal sinus tenderness.  Mouth/Throat: Uvula is midline, oropharynx is clear and moist and mucous membranes are normal. No oropharyngeal exudate, posterior oropharyngeal edema, posterior oropharyngeal erythema or tonsillar abscesses.  Fluid behind L TM, congestion White PNdrainage.  Eyes: Conjunctivae and EOM are normal. Pupils are equal, round, and reactive to light. No scleral icterus.  Neck: Normal range of  motion. Neck supple.  Cardiovascular: Normal rate, regular rhythm, normal heart sounds and intact distal pulses.   No murmur heard. Pulmonary/Chest: Effort normal and breath sounds normal. No respiratory distress. She has no wheezes. She has no rales.  Lymphadenopathy:    She has no cervical adenopathy.  Skin: Skin is warm and dry. No rash noted.       Assessment & Plan:

## 2012-07-07 NOTE — Patient Instructions (Addendum)
Sounds like you have a viral upper respiratory infection. Antibiotics are not needed for this.  Viral infections usually take 7-10 days to resolve.  The cough can last several weeks to go away. Use medication as prescribed: cheratussin for cough as needed - may make you sleepy. Push fluids and plenty of rest. Salt water gargles, popsicles or herbal teas to soothe throat. Please return if you are not improving as expected, or if you have high fevers (>101.5) or difficulty swallowing or worsening productive cough. Call clinic with questions.  Good to see you today.

## 2012-07-22 ENCOUNTER — Telehealth: Payer: Self-pay | Admitting: Family Medicine

## 2012-07-22 DIAGNOSIS — R7309 Other abnormal glucose: Secondary | ICD-10-CM

## 2012-07-22 DIAGNOSIS — Z0000 Encounter for general adult medical examination without abnormal findings: Secondary | ICD-10-CM

## 2012-07-22 DIAGNOSIS — E785 Hyperlipidemia, unspecified: Secondary | ICD-10-CM

## 2012-07-22 DIAGNOSIS — D649 Anemia, unspecified: Secondary | ICD-10-CM

## 2012-07-22 NOTE — Telephone Encounter (Signed)
Message copied by Judy Pimple on Thu Jul 22, 2012  4:00 PM ------      Message from: Alvina Chou      Created: Tue Jul 20, 2012  2:59 PM      Regarding: Lab orders for Tuesday, July 1.14       Patient is scheduled for CPX labs, please order future labs, Thanks , Terri       ------

## 2012-08-03 ENCOUNTER — Other Ambulatory Visit (INDEPENDENT_AMBULATORY_CARE_PROVIDER_SITE_OTHER): Payer: BC Managed Care – PPO

## 2012-08-03 DIAGNOSIS — Z0000 Encounter for general adult medical examination without abnormal findings: Secondary | ICD-10-CM

## 2012-08-03 DIAGNOSIS — E785 Hyperlipidemia, unspecified: Secondary | ICD-10-CM

## 2012-08-03 DIAGNOSIS — R7309 Other abnormal glucose: Secondary | ICD-10-CM

## 2012-08-03 DIAGNOSIS — E8809 Other disorders of plasma-protein metabolism, not elsewhere classified: Secondary | ICD-10-CM

## 2012-08-03 LAB — COMPREHENSIVE METABOLIC PANEL
ALT: 12 U/L (ref 0–35)
AST: 17 U/L (ref 0–37)
Calcium: 9.7 mg/dL (ref 8.4–10.5)
Chloride: 105 mEq/L (ref 96–112)
Creatinine, Ser: 0.8 mg/dL (ref 0.4–1.2)
Sodium: 138 mEq/L (ref 135–145)
Total Protein: 8.3 g/dL (ref 6.0–8.3)

## 2012-08-03 LAB — LIPID PANEL
HDL: 44.1 mg/dL (ref >39.00)
LDL Cholesterol: 118 mg/dL — ABNORMAL HIGH (ref 0–99)
Total CHOL/HDL Ratio: 4
Triglycerides: 80 mg/dL (ref 0.0–149.0)

## 2012-08-03 LAB — CBC WITH DIFFERENTIAL/PLATELET
Basophils Absolute: 0 10*3/uL (ref 0.0–0.1)
Eosinophils Absolute: 0.1 10*3/uL (ref 0.0–0.7)
Hemoglobin: 11.7 g/dL — ABNORMAL LOW (ref 12.0–15.0)
Lymphocytes Relative: 25 % (ref 12.0–46.0)
MCHC: 33.1 g/dL (ref 30.0–36.0)
Neutro Abs: 5 10*3/uL (ref 1.4–7.7)
Neutrophils Relative %: 68.9 % (ref 43.0–77.0)
Platelets: 355 10*3/uL (ref 150.0–400.0)
RDW: 13.9 % (ref 11.5–14.6)

## 2012-08-10 ENCOUNTER — Other Ambulatory Visit: Payer: BC Managed Care – PPO

## 2012-08-11 ENCOUNTER — Ambulatory Visit (INDEPENDENT_AMBULATORY_CARE_PROVIDER_SITE_OTHER): Payer: BC Managed Care – PPO | Admitting: Family Medicine

## 2012-08-11 ENCOUNTER — Encounter: Payer: Self-pay | Admitting: Family Medicine

## 2012-08-11 VITALS — BP 142/82 | HR 83 | Temp 98.5°F | Ht 63.75 in | Wt 166.5 lb

## 2012-08-11 DIAGNOSIS — Z23 Encounter for immunization: Secondary | ICD-10-CM

## 2012-08-11 DIAGNOSIS — D649 Anemia, unspecified: Secondary | ICD-10-CM

## 2012-08-11 DIAGNOSIS — E785 Hyperlipidemia, unspecified: Secondary | ICD-10-CM

## 2012-08-11 DIAGNOSIS — Z1211 Encounter for screening for malignant neoplasm of colon: Secondary | ICD-10-CM | POA: Insufficient documentation

## 2012-08-11 DIAGNOSIS — R7309 Other abnormal glucose: Secondary | ICD-10-CM

## 2012-08-11 DIAGNOSIS — Z0000 Encounter for general adult medical examination without abnormal findings: Secondary | ICD-10-CM

## 2012-08-11 NOTE — Patient Instructions (Addendum)
I'm glad you are doing well  Keep working on weight loss Try to increase exercise to 5 days per week  Don't forget to schedule your annual mammogram for aug Your next A1C will probably be better  Get non fasting lab in 3 months for a1c  Hep A vaccine today

## 2012-08-11 NOTE — Progress Notes (Signed)
Subjective:    Patient ID: Carolyn Matthews, female    DOB: [birth year], 56 y.o.   MRN: [id number]  HPI Here for health maintenance exam and to review chronic medical problems   Doing pretty good overall   Wt is down 8 lb with bmi of 28 Has been trying to loose ! - she cut out sodas and has decreased sugar in her diet quite a bit  A1c is up to 6.5 - but she was on steroids for a foot problem this month  Helped a lot - seeing Dr Al Corpus  Pap 11/05  Had a hysterectomy No symptoms or problems or new partners or abn paps in past   Colon cancer screen- declines colonosc- will do an IFOB   Flu vaccine- did not get due to egg allergy   Mammogram 8/13- will be due soon - will make own appt at the breast center  Self exam -no lumps or changes Does have a family hx   Td 1/08  Hyperlipidemia Lab Results  Component Value Date   CHOL 178 08/03/2012   CHOL 175 08/26/2010   CHOL 173 06/03/2010   Lab Results  Component Value Date   HDL 44.10 08/03/2012   HDL 53.80 08/26/2010   HDL 40.98 06/03/2010   Lab Results  Component Value Date   LDLCALC 118* 08/03/2012   LDLCALC 108* 08/26/2010   LDLCALC 110* 06/03/2010   Lab Results  Component Value Date   TRIG 80.0 08/03/2012   TRIG 64.0 08/26/2010   TRIG 48.0 06/03/2010   Lab Results  Component Value Date   CHOLHDL 4 08/03/2012   CHOLHDL 3 08/26/2010   CHOLHDL 3 06/03/2010   No results found for this basename: LDLDIRECT   diet- is better Some exercise - one day a week  Profile and ratio are fairly stable   Hyperglycemia a1c is 6.5 - because of steriods  Glucose fasting 105  Anemia is the same Lab Results  Component Value Date   WBC 7.3 08/03/2012   HGB 11.7* 08/03/2012   HCT 35.3* 08/03/2012   MCV 92.2 08/03/2012   PLT 355.0 08/03/2012    She is traveling to [place] soon- will do first hep A vaccine  Patient Active Problem List   Diagnosis Date Noted  . 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 Stress reaction 02/26/2011  . Traumatic  arthritis of ankle 11/14/2010  . Occult blood in stools 09/14/2010  . Other screening mammogram 08/28/2010  . Elevated blood protein 08/28/2010  . Dermatitis 06/24/2010  . Routine general medical examination at a health care facility 05/23/2010  . MVP (mitral valve prolapse)   . Chronic headaches   . Seasonal allergies   . Family history of breast cancer in female   . HYPERGLYCEMIA, BORDERLINE 04/04/2009  . HYPERLIPIDEMIA 11/22/2008  . Unspecified Anemia 02/12/2007  . OVERACTIVE BLADDER 01/11/2007  . FIBROCYSTIC BREAST DISEASE 01/11/2007   Past Medical History  Diagnosis Date  . MVP (mitral valve prolapse)   . Anemia   . Chronic headaches   . Seasonal allergies   . Fibrocystic breast disease   . Hyperglycemia     Diet controlled  . Family history of breast cancer in female     Daughter  . 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   Past Surgical History  Procedure Laterality Date  . Breast biopsy  03/2004    Negative  . Partial hysterectomy  09/1996    Fibroids  . Breast lumpectomy  12/1977  . Breast  cyst aspiration  05/2007    Bilateral-benign  . Ankle fracture surgery  05/2003   History  Substance Use Topics  . Smoking status: Never Smoker   . Smokeless tobacco: Never Used  . Alcohol Use: No   Family History  Problem Relation Age of Onset  . Diabetes Father   . Hypertension Mother   . Breast cancer Daughter     (inflammatory)-now has brain tumor  . Diabetes Paternal Grandmother   . Breast cancer      great aunt   Allergies  Allergen Reactions  . Eggs Or Egg-Derived Products     REACTION: SEVERE REACTION   Current Outpatient Prescriptions on File Prior to Visit  Medication Sig Dispense Refill  . acetaminophen (TYLENOL) 500 MG tablet Take 500 mg by mouth every 6 (six) hours as needed.        . cetirizine (ZYRTEC) 10 MG tablet Take 10 mg by mouth daily as needed.        . fexofenadine-pseudoephedrine (ALLEGRA-D) 60-120 MG per tablet Take 1 tablet by mouth 2  (two) times daily as needed.        Marland Kitchen glucose blood test strip 1 each by Other route 2 (two) times daily. Use as instructed       . guaiFENesin-codeine (ROBITUSSIN AC) 100-10 MG/5ML syrup Take 5 mLs by mouth 2 (two) times daily as needed for cough.  180 mL  0  . ibuprofen (ADVIL,MOTRIN) 200 MG tablet Take 200 mg by mouth every 6 (six) hours as needed.        . Lancets 28G MISC by Does not apply route 2 (two) times daily.        . mometasone (NASONEX) 50 MCG/ACT nasal spray 2 sprays in each nostril per day  17 g  12  . Multiple Vitamin (MULTIVITAMIN) tablet Take 1 tablet by mouth daily.        Marland Kitchen nystatin (MYCOSTATIN) cream Apply topically as directed. Nystatin cream 100,000 units per gm apply to affected area once daily as directed.  30 g  1   No current facility-administered medications on file prior to visit.    Review of Systems Review of Systems  Constitutional: Negative for fever, appetite change, fatigue and unexpected weight change.  Eyes: Negative for pain and visual disturbance.  Respiratory: Negative for cough and shortness of breath.   Cardiovascular: Negative for cp or palpitations    Gastrointestinal: Negative for nausea, diarrhea and constipation.  Genitourinary: Negative for urgency and frequency.  Skin: Negative for pallor or rash   Neurological: Negative for weakness, light-headedness, numbness and headaches.  Hematological: Negative for adenopathy. Does not bruise/bleed easily.  Psychiatric/Behavioral: Negative for dysphoric mood. The patient is not nervous/anxious.         Objective:   Physical Exam  Constitutional: She appears well-developed and well-nourished. No distress.  overwt and well app  HENT:  Head: Normocephalic and atraumatic.  Right Ear: External ear normal.  Left Ear: External ear normal.  Nose: Nose normal.  Mouth/Throat: Oropharynx is clear and moist.  Eyes: Conjunctivae and EOM are normal. Pupils are equal, round, and reactive to light. Right eye  exhibits no discharge. Left eye exhibits no discharge. No scleral icterus.  Neck: Normal range of motion. Neck supple. No JVD present. Carotid bruit is not present. No thyromegaly present.  Cardiovascular: Normal rate, regular rhythm, normal heart sounds and intact distal pulses.  Exam reveals no gallop.   Pulmonary/Chest: Effort normal and breath sounds normal. No respiratory distress.  She has no wheezes.  Abdominal: Soft. Bowel sounds are normal. She exhibits no distension, no abdominal bruit and no mass. There is no tenderness.  Genitourinary: No breast swelling, tenderness, discharge or bleeding.  Breast exam: No mass, nodules, thickening, tenderness, bulging, retraction, inflamation, nipple discharge or skin changes noted.  No axillary or clavicular LA.  Chaperoned exam.    Musculoskeletal: She exhibits no edema and no tenderness.  Lymphadenopathy:    She has no cervical adenopathy.  Neurological: She is alert. She has normal reflexes. No cranial nerve deficit. She exhibits normal muscle tone. Coordination normal.  Skin: Skin is warm and dry. No rash noted. No erythema. No pallor.  Psychiatric: She has a normal mood and affect.          Assessment & Plan:

## 2012-08-12 NOTE — Assessment & Plan Note (Signed)
Disc goals for lipids and reasons to control them Rev labs with pt Rev low sat fat diet in detail   

## 2012-08-12 NOTE — Assessment & Plan Note (Addendum)
Pt will do IFOB- she declines colonosc

## 2012-08-12 NOTE — Assessment & Plan Note (Signed)
Reviewed health habits including diet and exercise and skin cancer prevention Also reviewed health mt list, fam hx and immunizations  Wellness lab rev

## 2012-08-12 NOTE — Assessment & Plan Note (Signed)
Stable and asymptomatic and mild

## 2012-08-12 NOTE — Assessment & Plan Note (Signed)
Lab Results  Component Value Date   HGBA1C 6.5 08/03/2012   Exp imp next time- this was elevated by recent steroids for foot problem Diet / exercise/ wt loss-doing well

## 2012-08-13 ENCOUNTER — Ambulatory Visit (INDEPENDENT_AMBULATORY_CARE_PROVIDER_SITE_OTHER): Payer: BC Managed Care – PPO | Admitting: Family Medicine

## 2012-08-13 ENCOUNTER — Encounter: Payer: Self-pay | Admitting: Family Medicine

## 2012-08-13 VITALS — BP 135/85 | HR 63 | Ht 64.0 in | Wt 165.0 lb

## 2012-08-13 DIAGNOSIS — Z01419 Encounter for gynecological examination (general) (routine) without abnormal findings: Secondary | ICD-10-CM

## 2012-08-13 NOTE — Progress Notes (Signed)
  Subjective:     Carolyn Matthews is a 57 y.o. female and is here for a comprehensive physical exam. The patient reports problems - vaginal discharge that is itchy.  Reports that it comes and goes and that she does not use any type of treatment for it.  She does take a bath daily, no showers.  No douching.  She is s/p hysterectomy in 1995 for fibroids.  Ovaries remain, but has hot flashes and is likely menopausal..  History   Social History  . Marital Status: Married    Spouse Name: N/A    Number of Children: 2  . Years of Education: N/A   Occupational History  . [employer] [place]    [employer]  .     Social History Main Topics  . Smoking status: Never Smoker   . Smokeless tobacco: Never Used  . Alcohol Use: No  . Drug Use: No  . Sexually Active: Not on file   Other Topics Concern  . Not on file   Social History Narrative  . No narrative on file   Health Maintenance  Topic Date Due  . Pap Smear  12/05/2006  . Colonoscopy  01/14/2007  . Influenza Vaccine  10/04/2012  . Mammogram  10/01/2013  . Tetanus/tdap  02/04/2016    The following portions of the patient's history were reviewed and updated as appropriate: allergies, current medications, past family history, past medical history, past social history, past surgical history and problem list.  Review of Systems Pertinent items are noted in HPI.   Objective:    BP 135/85  Pulse 63  Ht 5\' 4"  (1.626 m)  Wt 165 lb (74.844 kg)  BMI 28.31 kg/m2 General appearance: alert, cooperative and appears stated age Head: Normocephalic, without obvious abnormality, atraumatic Neck: no adenopathy, supple, symmetrical, trachea midline and thyroid not enlarged, symmetric, no tenderness/mass/nodules Lungs: clear to auscultation bilaterally Breasts: normal appearance, no masses or tenderness, scar at right breast, well healed Heart: regular rate and rhythm, S1, S2 normal, no murmur, click, rub or  gallop Abdomen: soft, non-tender; bowel sounds normal; no masses,  no organomegaly Pelvic: vagina normal without discharge and surgically absent cervix and uterus, adnexa are without mass Extremities: extremities normal, atraumatic, no cyanosis or edema Pulses: 2+ and symmetric Skin: Skin color, texture, turgor normal. No rashes or lesions Lymph nodes: Cervical, supraclavicular, and axillary nodes normal. Neurologic: Grossly normal    Assessment:    Healthy female exam. Vaginal discharge intermittently, none today.     Plan:    PCP follows annual blood work Mammogram next month. No longer needs pap, s/p hysterectomy for benign reasons--discussed at length with pt. Should not take sit-down baths, showers only.  Yogurt for maintenance of normal vaginal flora. If this continues to be a problem, please return for full evaluation. See After Visit Summary for Counseling Recommendations

## 2012-08-13 NOTE — Patient Instructions (Addendum)
Preventive Care for Adults, Female A healthy lifestyle and preventive care can promote health and wellness. Preventive health guidelines for women include the following key practices.  A routine yearly physical is a good way to check with your caregiver about your health and preventive screening. It is a chance to share any concerns and updates on your health, and to receive a thorough exam.  Visit your dentist for a routine exam and preventive care every 6 months. Brush your teeth twice a day and floss once a day. Good oral hygiene prevents tooth decay and gum disease.  The frequency of eye exams is based on your age, health, family medical history, use of contact lenses, and other factors. Follow your caregiver's recommendations for frequency of eye exams.  Eat a healthy diet. Foods like vegetables, fruits, whole grains, low-fat dairy products, and lean protein foods contain the nutrients you need without too many calories. Decrease your intake of foods high in solid fats, added sugars, and salt. Eat the right amount of calories for you.Get information about a proper diet from your caregiver, if necessary.  Regular physical exercise is one of the most important things you can do for your health. Most adults should get at least 150 minutes of moderate-intensity exercise (any activity that increases your heart rate and causes you to sweat) each week. In addition, most adults need muscle-strengthening exercises on 2 or more days a week.  Maintain a healthy weight. The body mass index (BMI) is a screening tool to identify possible weight problems. It provides an estimate of body fat based on height and weight. Your caregiver can help determine your BMI, and can help you achieve or maintain a healthy weight.For adults 20 years and older:  A BMI below 18.5 is considered underweight.  A BMI of 18.5 to 24.9 is normal.  A BMI of 25 to 29.9 is considered overweight.  A BMI of 30 and above is  considered obese.  Maintain normal blood lipids and cholesterol levels by exercising and minimizing your intake of saturated fat. Eat a balanced diet with plenty of fruit and vegetables. Blood tests for lipids and cholesterol should begin at age 20 and be repeated every 5 years. If your lipid or cholesterol levels are high, you are over 50, or you are at high risk for heart disease, you may need your cholesterol levels checked more frequently.Ongoing high lipid and cholesterol levels should be treated with medicines if diet and exercise are not effective.  If you smoke, find out from your caregiver how to quit. If you do not use tobacco, do not start.  If you are pregnant, do not drink alcohol. If you are breastfeeding, be very cautious about drinking alcohol. If you are not pregnant and choose to drink alcohol, do not exceed 1 drink per day. One drink is considered to be 12 ounces (355 mL) of beer, 5 ounces (148 mL) of wine, or 1.5 ounces (44 mL) of liquor.  Avoid use of street drugs. Do not share needles with anyone. Ask for help if you need support or instructions about stopping the use of drugs.  High blood pressure causes heart disease and increases the risk of stroke. Your blood pressure should be checked at least every 1 to 2 years. Ongoing high blood pressure should be treated with medicines if weight loss and exercise are not effective.  If you are 55 to 56 years old, ask your caregiver if you should take aspirin to prevent strokes.  Diabetes   screening involves taking a blood sample to check your fasting blood sugar level. This should be done once every 3 years, after age 45, if you are within normal weight and without risk factors for diabetes. Testing should be considered at a younger age or be carried out more frequently if you are overweight and have at least 1 risk factor for diabetes.  Breast cancer screening is essential preventive care for women. You should practice "breast  self-awareness." This means understanding the normal appearance and feel of your breasts and may include breast self-examination. Any changes detected, no matter how small, should be reported to a caregiver. Women in their 20s and 30s should have a clinical breast exam (CBE) by a caregiver as part of a regular health exam every 1 to 3 years. After age 40, women should have a CBE every year. Starting at age 40, women should consider having a mammography (breast X-ray test) every year. Women who have a family history of breast cancer should talk to their caregiver about genetic screening. Women at a high risk of breast cancer should talk to their caregivers about having magnetic resonance imaging (MRI) and a mammography every year.  The Pap test is a screening test for cervical cancer. A Pap test can show cell changes on the cervix that might become cervical cancer if left untreated. A Pap test is a procedure in which cells are obtained and examined from the lower end of the uterus (cervix).  Women should have a Pap test starting at age 21.  Between ages 21 and 29, Pap tests should be repeated every 2 years.  Beginning at age 30, you should have a Pap test every 3 years as long as the past 3 Pap tests have been normal.  Some women have medical problems that increase the chance of getting cervical cancer. Talk to your caregiver about these problems. It is especially important to talk to your caregiver if a new problem develops soon after your last Pap test. In these cases, your caregiver may recommend more frequent screening and Pap tests.  The above recommendations are the same for women who have or have not gotten the vaccine for human papillomavirus (HPV).  If you had a hysterectomy for a problem that was not cancer or a condition that could lead to cancer, then you no longer need Pap tests. Even if you no longer need a Pap test, a regular exam is a good idea to make sure no other problems are  starting.  If you are between ages 65 and 70, and you have had normal Pap tests going back 10 years, you no longer need Pap tests. Even if you no longer need a Pap test, a regular exam is a good idea to make sure no other problems are starting.  If you have had past treatment for cervical cancer or a condition that could lead to cancer, you need Pap tests and screening for cancer for at least 20 years after your treatment.  If Pap tests have been discontinued, risk factors (such as a new sexual partner) need to be reassessed to determine if screening should be resumed.  The HPV test is an additional test that may be used for cervical cancer screening. The HPV test looks for the virus that can cause the cell changes on the cervix. The cells collected during the Pap test can be tested for HPV. The HPV test could be used to screen women aged 30 years and older, and should   be used in women of any age who have unclear Pap test results. After the age of 30, women should have HPV testing at the same frequency as a Pap test.  Colorectal cancer can be detected and often prevented. Most routine colorectal cancer screening begins at the age of 50 and continues through age 75. However, your caregiver may recommend screening at an earlier age if you have risk factors for colon cancer. On a yearly basis, your caregiver may provide home test kits to check for hidden blood in the stool. Use of a small camera at the end of a tube, to directly examine the colon (sigmoidoscopy or colonoscopy), can detect the earliest forms of colorectal cancer. Talk to your caregiver about this at age 50, when routine screening begins. Direct examination of the colon should be repeated every 5 to 10 years through age 75, unless early forms of pre-cancerous polyps or small growths are found.  Hepatitis C blood testing is recommended for all people born from 1945 through 1965 and any individual with known risks for hepatitis C.  Practice  safe sex. Use condoms and avoid high-risk sexual practices to reduce the spread of sexually transmitted infections (STIs). STIs include gonorrhea, chlamydia, syphilis, trichomonas, herpes, HPV, and human immunodeficiency virus (HIV). Herpes, HIV, and HPV are viral illnesses that have no cure. They can result in disability, cancer, and death. Sexually active women aged 25 and younger should be checked for chlamydia. Older women with new or multiple partners should also be tested for chlamydia. Testing for other STIs is recommended if you are sexually active and at increased risk.  Osteoporosis is a disease in which the bones lose minerals and strength with aging. This can result in serious bone fractures. The risk of osteoporosis can be identified using a bone density scan. Women ages 65 and over and women at risk for fractures or osteoporosis should discuss screening with their caregivers. Ask your caregiver whether you should take a calcium supplement or vitamin D to reduce the rate of osteoporosis.  Menopause can be associated with physical symptoms and risks. Hormone replacement therapy is available to decrease symptoms and risks. You should talk to your caregiver about whether hormone replacement therapy is right for you.  Use sunscreen with sun protection factor (SPF) of 30 or more. Apply sunscreen liberally and repeatedly throughout the day. You should seek shade when your shadow is shorter than you. Protect yourself by wearing long sleeves, pants, a wide-brimmed hat, and sunglasses year round, whenever you are outdoors.  Once a month, do a whole body skin exam, using a mirror to look at the skin on your back. Notify your caregiver of new moles, moles that have irregular borders, moles that are larger than a pencil eraser, or moles that have changed in shape or color.  Stay current with required immunizations.  Influenza. You need a dose every fall (or winter). The composition of the flu vaccine  changes each year, so being vaccinated once is not enough.  Pneumococcal polysaccharide. You need 1 to 2 doses if you smoke cigarettes or if you have certain chronic medical conditions. You need 1 dose at age 65 (or older) if you have never been vaccinated.  Tetanus, diphtheria, pertussis (Tdap, Td). Get 1 dose of Tdap vaccine if you are younger than age 65, are over 65 and have contact with an infant, are a healthcare worker, are pregnant, or simply want to be protected from whooping cough. After that, you need a Td   booster dose every 10 years. Consult your caregiver if you have not had at least 3 tetanus and diphtheria-containing shots sometime in your life or have a deep or dirty wound.  HPV. You need this vaccine if you are a woman age 26 or younger. The vaccine is given in 3 doses over 6 months.  Measles, mumps, rubella (MMR). You need at least 1 dose of MMR if you were born in 1957 or later. You may also need a second dose.  Meningococcal. If you are age 19 to 21 and a first-year college student living in a residence hall, or have one of several medical conditions, you need to get vaccinated against meningococcal disease. You may also need additional booster doses.  Zoster (shingles). If you are age 60 or older, you should get this vaccine.  Varicella (chickenpox). If you have never had chickenpox or you were vaccinated but received only 1 dose, talk to your caregiver to find out if you need this vaccine.  Hepatitis A. You need this vaccine if you have a specific risk factor for hepatitis A virus infection or you simply wish to be protected from this disease. The vaccine is usually given as 2 doses, 6 to 18 months apart.  Hepatitis B. You need this vaccine if you have a specific risk factor for hepatitis B virus infection or you simply wish to be protected from this disease. The vaccine is given in 3 doses, usually over 6 months. Preventive Services / Frequency Ages 19 to 39  Blood  pressure check.** / Every 1 to 2 years.  Lipid and cholesterol check.** / Every 5 years beginning at age 20.  Clinical breast exam.** / Every 3 years for women in their 20s and 30s.  Pap test.** / Every 2 years from ages 21 through 29. Every 3 years starting at age 30 through age 65 or 70 with a history of 3 consecutive normal Pap tests.  HPV screening.** / Every 3 years from ages 30 through ages 65 to 70 with a history of 3 consecutive normal Pap tests.  Hepatitis C blood test.** / For any individual with known risks for hepatitis C.  Skin self-exam. / Monthly.  Influenza immunization.** / Every year.  Pneumococcal polysaccharide immunization.** / 1 to 2 doses if you smoke cigarettes or if you have certain chronic medical conditions.  Tetanus, diphtheria, pertussis (Tdap, Td) immunization. / A one-time dose of Tdap vaccine. After that, you need a Td booster dose every 10 years.  HPV immunization. / 3 doses over 6 months, if you are 26 and younger.  Measles, mumps, rubella (MMR) immunization. / You need at least 1 dose of MMR if you were born in 1957 or later. You may also need a second dose.  Meningococcal immunization. / 1 dose if you are age 19 to 21 and a first-year college student living in a residence hall, or have one of several medical conditions, you need to get vaccinated against meningococcal disease. You may also need additional booster doses.  Varicella immunization.** / Consult your caregiver.  Hepatitis A immunization.** / Consult your caregiver. 2 doses, 6 to 18 months apart.  Hepatitis B immunization.** / Consult your caregiver. 3 doses usually over 6 months. Ages 40 to 64  Blood pressure check.** / Every 1 to 2 years.  Lipid and cholesterol check.** / Every 5 years beginning at age 20.  Clinical breast exam.** / Every year after age 40.  Mammogram.** / Every year beginning at age 40   and continuing for as long as you are in good health. Consult with your  caregiver.  Pap test.** / Every 3 years starting at age 30 through age 65 or 70 with a history of 3 consecutive normal Pap tests.  HPV screening.** / Every 3 years from ages 30 through ages 65 to 70 with a history of 3 consecutive normal Pap tests.  Fecal occult blood test (FOBT) of stool. / Every year beginning at age 50 and continuing until age 75. You may not need to do this test if you get a colonoscopy every 10 years.  Flexible sigmoidoscopy or colonoscopy.** / Every 5 years for a flexible sigmoidoscopy or every 10 years for a colonoscopy beginning at age 50 and continuing until age 75.  Hepatitis C blood test.** / For all people born from 1945 through 1965 and any individual with known risks for hepatitis C.  Skin self-exam. / Monthly.  Influenza immunization.** / Every year.  Pneumococcal polysaccharide immunization.** / 1 to 2 doses if you smoke cigarettes or if you have certain chronic medical conditions.  Tetanus, diphtheria, pertussis (Tdap, Td) immunization.** / A one-time dose of Tdap vaccine. After that, you need a Td booster dose every 10 years.  Measles, mumps, rubella (MMR) immunization. / You need at least 1 dose of MMR if you were born in 1957 or later. You may also need a second dose.  Varicella immunization.** / Consult your caregiver.  Meningococcal immunization.** / Consult your caregiver.  Hepatitis A immunization.** / Consult your caregiver. 2 doses, 6 to 18 months apart.  Hepatitis B immunization.** / Consult your caregiver. 3 doses, usually over 6 months. Ages 65 and over  Blood pressure check.** / Every 1 to 2 years.  Lipid and cholesterol check.** / Every 5 years beginning at age 20.  Clinical breast exam.** / Every year after age 40.  Mammogram.** / Every year beginning at age 40 and continuing for as long as you are in good health. Consult with your caregiver.  Pap test.** / Every 3 years starting at age 30 through age 65 or 70 with a 3  consecutive normal Pap tests. Testing can be stopped between 65 and 70 with 3 consecutive normal Pap tests and no abnormal Pap or HPV tests in the past 10 years.  HPV screening.** / Every 3 years from ages 30 through ages 65 or 70 with a history of 3 consecutive normal Pap tests. Testing can be stopped between 65 and 70 with 3 consecutive normal Pap tests and no abnormal Pap or HPV tests in the past 10 years.  Fecal occult blood test (FOBT) of stool. / Every year beginning at age 50 and continuing until age 75. You may not need to do this test if you get a colonoscopy every 10 years.  Flexible sigmoidoscopy or colonoscopy.** / Every 5 years for a flexible sigmoidoscopy or every 10 years for a colonoscopy beginning at age 50 and continuing until age 75.  Hepatitis C blood test.** / For all people born from 1945 through 1965 and any individual with known risks for hepatitis C.  Osteoporosis screening.** / A one-time screening for women ages 65 and over and women at risk for fractures or osteoporosis.  Skin self-exam. / Monthly.  Influenza immunization.** / Every year.  Pneumococcal polysaccharide immunization.** / 1 dose at age 65 (or older) if you have never been vaccinated.  Tetanus, diphtheria, pertussis (Tdap, Td) immunization. / A one-time dose of Tdap vaccine if you are over   65 and have contact with an infant, are a Research scientist (physical sciences), or simply want to be protected from whooping cough. After that, you need a Td booster dose every 10 years.  Varicella immunization.** / Consult your caregiver.  Meningococcal immunization.** / Consult your caregiver.  Hepatitis A immunization.** / Consult your caregiver. 2 doses, 6 to 18 months apart.  Hepatitis B immunization.** / Check with your caregiver. 3 doses, usually over 6 months. ** Family history and personal history of risk and conditions may change your caregiver's recommendations. Document Released: 03/18/2001 Document Revised: 04/14/2011  Document Reviewed: 06/17/2010 [facility] Patient Information 2014 [place], [place]. Vaginitis Vaginitis is an inflammation of the vagina. It is most often caused by a change in the normal balance of the bacteria and yeast that live in the vagina. This change in balance causes an overgrowth of certain bacteria or yeast, which causes the inflammation. There are different types of vaginitis, but the most common types are:  Bacterial vaginosis.  Yeast infection (candidiasis).  Trichomoniasis vaginitis. This is a sexually transmitted infection (STI).  Viral vaginitis.  Atropic vaginitis.  Allergic vaginitis. CAUSES  The cause depends on the type of vaginitis. Vaginitis can be caused by:  Bacteria (bacterial vaginosis).  Yeast (yeast infection).  A parasite (trichomoniasis vaginitis)  A virus (viral vaginitis).  Low hormone levels (atrophic vaginitis). Low hormone levels can occur during pregnancy, breastfeeding, or after menopause.  Irritants, such as bubble baths, scented tampons, and feminine sprays (allergic vaginitis). Other factors can change the normal balance of the yeast and bacteria that live in the vagina. These include:  Antibiotic medicines.  Poor hygiene.  Diaphragms, vaginal sponges, spermicides, birth control pills, and intrauterine devices (IUD).  Sexual intercourse.  Infection.  Uncontrolled diabetes.  A weakened immune system. SYMPTOMS  Symptoms can vary depending on the cause of the vaginitis. Common symptoms include:  Abnormal vaginal discharge.  The discharge is white, gray, or yellow with bacterial vaginosis.  The discharge is thick, white, and cheesy with a yeast infection.  The discharge is frothy and yellow or greenish with trichomoniasis.  A bad vaginal odor.  The odor is fishy with bacterial vaginosis.  Vaginal itching, pain, or swelling.  Painful intercourse.  Pain or burning when urinating. Sometimes, there are no  symptoms. TREATMENT  Treatment will vary depending on the type of infection.   Bacterial vaginosis and trichomoniasis are often treated with antibiotic creams or pills.  Yeast infections are often treated with antifungal medicines, such as vaginal creams or suppositories.  Viral vaginitis has no cure, but symptoms can be treated with medicines that relieve discomfort. Your sexual partner should be treated as well.  Atrophic vaginitis may be treated with an estrogen cream, pill, suppository, or vaginal ring. If vaginal dryness occurs, lubricants and moisturizing creams may help. You may be told to avoid scented soaps, sprays, or douches.  Allergic vaginitis treatment involves quitting the use of the product that is causing the problem. Vaginal creams can be used to treat the symptoms. HOME CARE INSTRUCTIONS   Take all medicines as directed by your caregiver.  Keep your genital area clean and dry. Avoid soap and only rinse the area with water.  Avoid douching. It can remove the healthy bacteria in the vagina.  Do not use tampons or have sexual intercourse until your vaginitis has been treated. Use sanitary pads while you have vaginitis.  Wipe from front to back. This avoids the spread of bacteria from the rectum to the vagina.  Let air  reach your genital area.  Wear cotton underwear to decrease moisture buildup.  Avoid wearing underwear while you sleep until your vaginitis is gone.  Avoid tight pants and underwear or nylons without a cotton panel.  Take off wet clothing (especially bathing suits) as soon as possible.  Use mild, non-scented products. Avoid using irritants, such as:  Scented feminine sprays.  Fabric softeners.  Scented detergents.  Scented tampons.  Scented soaps or bubble baths.  Practice safe sex and use condoms. Condoms may prevent the spread of trichomoniasis and viral vaginitis. SEEK MEDICAL CARE IF:   You have abdominal pain.  You have a fever or  persistent symptoms for more than 2 3 days.  You have a fever and your symptoms suddenly get worse. Document Released: 11/17/2006 Document Revised: 10/15/2011 Document Reviewed: 07/03/2011 [facility] Patient Information 2014 [place], [place].

## 2012-08-18 ENCOUNTER — Encounter: Payer: BC Managed Care – PPO | Admitting: Family Medicine

## 2012-09-10 ENCOUNTER — Other Ambulatory Visit: Payer: Self-pay

## 2012-09-10 DIAGNOSIS — Z1231 Encounter for screening mammogram for malignant neoplasm of breast: Secondary | ICD-10-CM

## 2012-09-27 ENCOUNTER — Other Ambulatory Visit: Payer: Self-pay | Admitting: *Deleted

## 2012-09-27 MED ORDER — BUSPIRONE HCL 15 MG PO TABS: 7.5000 mg | ORAL_TABLET | Freq: Two times a day (BID) | ORAL | Status: DC

## 2012-09-27 NOTE — Telephone Encounter (Signed)
done

## 2012-09-27 NOTE — Telephone Encounter (Signed)
Please refill for 12 months, thanks

## 2012-09-29 ENCOUNTER — Ambulatory Visit
Admission: RE | Admit: 2012-09-29 | Discharge: 2012-09-29 | Disposition: A | Discharge status: Home / Self Care | Payer: BC Managed Care – PPO | Source: Ambulatory Visit

## 2012-09-29 DIAGNOSIS — Z1231 Encounter for screening mammogram for malignant neoplasm of breast: Secondary | ICD-10-CM

## 2012-12-09 ENCOUNTER — Other Ambulatory Visit: Payer: Self-pay

## 2013-04-12 ENCOUNTER — Ambulatory Visit: Payer: 59

## 2013-04-12 ENCOUNTER — Telehealth: Payer: Self-pay

## 2013-04-12 NOTE — Telephone Encounter (Signed)
Pt was going to get 2nd Hep A vaccine and when asked questions prior to injection; pt said after 1st Hep A had almost immediate reaction of weakness, itching and feeling weird. Took Zyrtec and next day OK. Dr Milinda Antisower advised ? Allergic reaction but would not take 2nd Hep A.Pt voiced understanding.

## 2013-07-01 ENCOUNTER — Ambulatory Visit (INDEPENDENT_AMBULATORY_CARE_PROVIDER_SITE_OTHER): Payer: 59 | Admitting: Family Medicine

## 2013-07-01 ENCOUNTER — Encounter: Payer: Self-pay | Admitting: Family Medicine

## 2013-07-01 VITALS — BP 142/88 | HR 87 | Temp 98.5°F | Ht 63.75 in | Wt 171.8 lb

## 2013-07-01 DIAGNOSIS — N644 Mastodynia: Secondary | ICD-10-CM

## 2013-07-01 MED ORDER — NYSTATIN 100000 UNIT/GM EX CREA: TOPICAL_CREAM | CUTANEOUS | Status: DC

## 2013-07-01 NOTE — Patient Instructions (Addendum)
Watch breast for lumps/ rash or increasing pain  Use the nystatin cream on the area under arm - let me know if it does not improve  Keep me updated  Your screening mammogram is due after 09/29/13   Change your bra to a non underwire type for a while

## 2013-07-01 NOTE — Progress Notes (Signed)
Pre visit review using our clinic review tool, if applicable. No additional management support is needed unless otherwise documented below in the visit note. 

## 2013-07-01 NOTE — Progress Notes (Signed)
Subjective:    Patient ID: Carolyn Matthews, female    DOB: [birth year], 58 y.o.   MRN: [id number]  HPI Here for an area of breast "twinges"-nothing constant  No new activity/exercise  Her bra is just a little tight  Feels "lumpy" in general under that breast   mammo 8/14  Screening -nl   Hx of fibrocystic breast dz with lumpectomy and cyst drainage in the past   Daughter with breast cancer   No cp on exertion or sob  No pleuritic pain   Has a plaque/skin under R arm -using nystatin cream and it is getting better   Patient Active Problem List   Diagnosis Date Noted  . Colon cancer screening 08/11/2012  . 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 Stress reaction 02/26/2011  . 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 Occult blood in stools 09/14/2010  . Other screening mammogram 08/28/2010  . Elevated blood protein 08/28/2010  . Dermatitis 06/24/2010  . Routine general medical examination at a health care facility 05/23/2010  . MVP (mitral valve prolapse)   . Chronic headaches   . Seasonal allergies   . Family history of breast cancer in female   . Hyperglycemia 04/04/2009  . HYPERLIPIDEMIA 11/22/2008  . Anemia, unspecified 02/12/2007  . OVERACTIVE BLADDER 01/11/2007  . FIBROCYSTIC BREAST DISEASE 01/11/2007   Past Medical History  Diagnosis Date  . MVP (mitral valve prolapse)   . Anemia   . Chronic headaches   . Seasonal allergies   . Fibrocystic breast disease   . Hyperglycemia     Diet controlled  . Family history of breast cancer in female     Daughter  . 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   Past Surgical History  Procedure Laterality Date  . Breast biopsy  03/2004    Negative  . Partial hysterectomy  09/1996    Fibroids  . Breast lumpectomy  12/1977  . Breast cyst aspiration  05/2007    Bilateral-benign  . Ankle fracture surgery  05/2003   History  Substance Use Topics  . Smoking status: Never Smoker   . Smokeless tobacco: Never Used  .  Alcohol Use: No   Family History  Problem Relation Age of Onset  . Diabetes Father   . Hypertension Mother   . Breast cancer Daughter     (inflammatory)-now has brain tumor  . Diabetes Paternal Grandmother   . Breast cancer      great aunt   Allergies  Allergen Reactions  . Eggs Or Egg-Derived Products     REACTION: SEVERE REACTION  . Hepatitis A Vaccine Other (See Comments)    ? Allergic reaction; itching,weakness almost immediate after taking 1st vaccine   Current Outpatient Prescriptions on File Prior to Visit  Medication Sig Dispense Refill  . acetaminophen (TYLENOL) 500 MG tablet Take 500 mg by mouth every 6 (six) hours as needed.        . busPIRone (BUSPAR) 15 MG tablet Take 0.5 tablets (7.5 mg total) by mouth 2 (two) times daily.  30 tablet  11  . cetirizine (ZYRTEC) 10 MG tablet Take 10 mg by mouth daily as needed.        . fexofenadine-pseudoephedrine (ALLEGRA-D) 60-120 MG per tablet Take 1 tablet by mouth 2 (two) times daily as needed.        Marland Kitchen glucose blood test strip 1 each by Other route 2 (two) times daily. Use as instructed       . ibuprofen (ADVIL,MOTRIN) 200  MG tablet Take 200 mg by mouth every 6 (six) hours as needed.        . Lancets 28G MISC by Does not apply route 2 (two) times daily.        . mometasone (NASONEX) 50 MCG/ACT nasal spray 2 sprays in each nostril per day  17 g  12  . Multiple Vitamin (MULTIVITAMIN) tablet Take 1 tablet by mouth daily.        Marland Kitchen. nystatin (MYCOSTATIN) cream Apply topically as directed. Nystatin cream 100,000 units per gm apply to affected area once daily as directed.  30 g  1   No current facility-administered medications on file prior to visit.    Review of Systems Review of Systems  Constitutional: Negative for fever, appetite change, fatigue and unexpected weight change.  Eyes: Negative for pain and visual disturbance.  Respiratory: Negative for cough and shortness of breath.   Cardiovascular: Negative for cp or palpitations     Gastrointestinal: Negative for nausea, diarrhea and constipation.  Genitourinary: Negative for urgency and frequency.  Skin: Negative for pallor or rash   Neurological: Negative for weakness, light-headedness, numbness and headaches.  Hematological: Negative for adenopathy. Does not bruise/bleed easily.  Psychiatric/Behavioral: Negative for dysphoric mood. The patient is not nervous/anxious.         Objective:   Physical Exam  Constitutional: She appears well-developed and well-nourished. No distress.  HENT:  Head: Normocephalic and atraumatic.  Eyes: Conjunctivae and EOM are normal. Pupils are equal, round, and reactive to light.  Neck: Normal range of motion. Neck supple.  Cardiovascular: Normal rate and regular rhythm.   Pulmonary/Chest: Effort normal and breath sounds normal.  Genitourinary: No breast swelling, discharge or bleeding.  Mildly tender over medial R breast without skin change or crepitus   Breast exam (bilateral)  : No mass, nodules, thickening, bulging, retraction, inflamation, nipple discharge or skin changes noted.  No axillary or clavicular LA.      Lymphadenopathy:    She has no cervical adenopathy.  Neurological: She is alert.  Skin: Skin is warm and dry. No rash noted. No erythema. No pallor.  Psychiatric: She has a normal mood and affect.          Assessment & Plan:

## 2013-07-03 NOTE — Assessment & Plan Note (Signed)
With nl exam  Fleeting - unsure of etiology - disc poss pinched nerve or MSK cause  Will watch closely Will call if any rash  Continue breast ca screening

## 2013-08-16 ENCOUNTER — Other Ambulatory Visit: Payer: Self-pay

## 2013-08-16 DIAGNOSIS — Z1231 Encounter for screening mammogram for malignant neoplasm of breast: Secondary | ICD-10-CM

## 2013-08-30 ENCOUNTER — Telehealth: Payer: Self-pay | Admitting: Family Medicine

## 2013-08-30 ENCOUNTER — Other Ambulatory Visit (INDEPENDENT_AMBULATORY_CARE_PROVIDER_SITE_OTHER): Payer: 59

## 2013-08-30 DIAGNOSIS — R7309 Other abnormal glucose: Secondary | ICD-10-CM

## 2013-08-30 DIAGNOSIS — Z0000 Encounter for general adult medical examination without abnormal findings: Secondary | ICD-10-CM

## 2013-08-30 DIAGNOSIS — E785 Hyperlipidemia, unspecified: Secondary | ICD-10-CM

## 2013-08-30 DIAGNOSIS — R739 Hyperglycemia, unspecified: Secondary | ICD-10-CM

## 2013-08-30 LAB — COMPREHENSIVE METABOLIC PANEL
ALBUMIN: 4.5 g/dL (ref 3.5–5.2)
ALK PHOS: 73 U/L (ref 39–117)
ALT: 12 U/L (ref 0–35)
AST: 17 U/L (ref 0–37)
BILIRUBIN TOTAL: 0.6 mg/dL (ref 0.2–1.2)
BUN: 11 mg/dL (ref 6–23)
CO2: 31 mEq/L (ref 19–32)
Calcium: 9.8 mg/dL (ref 8.4–10.5)
Chloride: 103 mEq/L (ref 96–112)
Creatinine, Ser: 0.8 mg/dL (ref 0.4–1.2)
GFR: 95.21 mL/min (ref >60.00)
Glucose, Bld: 107 mg/dL — ABNORMAL HIGH (ref 70–99)
POTASSIUM: 4.2 meq/L (ref 3.5–5.1)
SODIUM: 138 meq/L (ref 135–145)
Total Protein: 8.1 g/dL (ref 6.0–8.3)

## 2013-08-30 LAB — CBC WITH DIFFERENTIAL/PLATELET
BASOS PCT: 0.6 % (ref 0.0–3.0)
Basophils Absolute: 0.1 10*3/uL (ref 0.0–0.1)
EOS ABS: 0.1 10*3/uL (ref 0.0–0.7)
Eosinophils Relative: 1.8 % (ref 0.0–5.0)
HEMATOCRIT: 36.1 % (ref 36.0–46.0)
Hemoglobin: 12 g/dL (ref 12.0–15.0)
LYMPHS ABS: 2.4 10*3/uL (ref 0.7–4.0)
Lymphocytes Relative: 28.8 % (ref 12.0–46.0)
MCHC: 33.3 g/dL (ref 30.0–36.0)
MCV: 91.1 fl (ref 78.0–100.0)
MONO ABS: 0.4 10*3/uL (ref 0.1–1.0)
Monocytes Relative: 5.1 % (ref 3.0–12.0)
NEUTROS PCT: 63.7 % (ref 43.0–77.0)
Neutro Abs: 5.3 10*3/uL (ref 1.4–7.7)
PLATELETS: 362 10*3/uL (ref 150.0–400.0)
RBC: 3.96 Mil/uL (ref 3.87–5.11)
RDW: 13.4 % (ref 11.5–15.5)
WBC: 8.3 10*3/uL (ref 4.0–10.5)

## 2013-08-30 LAB — TSH: TSH: 1.43 u[IU]/mL (ref 0.35–4.50)

## 2013-08-30 LAB — LIPID PANEL
CHOLESTEROL: 174 mg/dL (ref 0–200)
HDL: 45.1 mg/dL (ref >39.00)
LDL CALC: 111 mg/dL — AB (ref 0–99)
NonHDL: 128.9
Total CHOL/HDL Ratio: 4
Triglycerides: 91 mg/dL (ref 0.0–149.0)
VLDL: 18.2 mg/dL (ref 0.0–40.0)

## 2013-08-30 LAB — HEMOGLOBIN A1C: HEMOGLOBIN A1C: 6.2 % (ref 4.6–6.5)

## 2013-08-30 NOTE — Telephone Encounter (Signed)
Message copied by Judy PimpleWER, Jaidy Cottam A on Tue Aug 30, 2013  1:15 PM ------      Message from: Alvina ChouWALSH, TERRI J      Created: Tue Aug 30, 2013 12:14 PM      Regarding: lab orders for now, pt came a day early       Patient is scheduled for CPX labs, please order future labs, Thanks , Terri            Pt wants a1c ------

## 2013-08-30 NOTE — Telephone Encounter (Signed)
Message copied by Judy PimpleWER, MARNE A on Tue Aug 30, 2013 10:44 PM ------      Message from: Baldomero LamyHAVERS, NATASHA C      Created: Wed Aug 24, 2013  3:52 PM      Regarding: Cpx labs 08/31/13 (Wed)       Please order  future cpx labs for pt's upcoming lab appt.      Thanks      Tasha       ------

## 2013-08-31 ENCOUNTER — Other Ambulatory Visit: Payer: BC Managed Care – PPO

## 2013-09-07 ENCOUNTER — Ambulatory Visit (INDEPENDENT_AMBULATORY_CARE_PROVIDER_SITE_OTHER): Payer: 59 | Admitting: Family Medicine

## 2013-09-07 ENCOUNTER — Encounter: Payer: Self-pay | Admitting: Family Medicine

## 2013-09-07 VITALS — BP 134/70 | HR 79 | Temp 98.7°F | Ht 63.5 in | Wt 166.5 lb

## 2013-09-07 DIAGNOSIS — E785 Hyperlipidemia, unspecified: Secondary | ICD-10-CM

## 2013-09-07 DIAGNOSIS — Z1211 Encounter for screening for malignant neoplasm of colon: Secondary | ICD-10-CM

## 2013-09-07 DIAGNOSIS — R739 Hyperglycemia, unspecified: Secondary | ICD-10-CM

## 2013-09-07 DIAGNOSIS — Z0000 Encounter for general adult medical examination without abnormal findings: Secondary | ICD-10-CM

## 2013-09-07 DIAGNOSIS — R7309 Other abnormal glucose: Secondary | ICD-10-CM

## 2013-09-07 MED ORDER — MOMETASONE FUROATE 50 MCG/ACT NA SUSP: 2.0000 | Freq: Every day | NASAL | Status: DC

## 2013-09-07 NOTE — Assessment & Plan Note (Signed)
Improved with diet and exercise/ wt loss Lab Results  Component Value Date   HGBA1C 6.2 08/30/2013    Will continue to follow Rev low glycemic diet

## 2013-09-07 NOTE — Assessment & Plan Note (Signed)
Reviewed health habits including diet and exercise and skin cancer prevention Reviewed appropriate screening tests for age  Also reviewed health mt list, fam hx and immunization status , as well as social and family history    Labs rev  See HPI Cannot get flu shot due to egg allergy

## 2013-09-07 NOTE — Progress Notes (Signed)
Subjective:    Patient ID: Carolyn Matthews, female    DOB: 15-Sep-1955, 58 y.o.   MRN: [id number]  HPI Here for health maintenance exam and to review chronic medical problems    Wt is down 5 lb with bmi of 29 She is working on weight - she cut back on sweets  Some exercise - not as much as she should- likes to walk but no time   Pap 05 - had a hysterectomy  No problems gyn   Colon screen - she had pos stool card 8/12 and declines colonsc- even after a visit with GI She has not changed her mind  If stool card came back pos she would consider a colonoscopy this time   Mammogram nl 8/14- has her mammogram scheduled for the end of the month  fam hx of breast ca No lumps on self exam and her breast pain stopped   Flu vaccine - does not get them due to egg allergy  Td 1/08  Results for orders placed in visit on 08/30/13  CBC WITH DIFFERENTIAL      Result Value Ref Range   WBC 8.3  4.0 - 10.5 K/uL   RBC 3.96  3.87 - 5.11 Mil/uL   Hemoglobin 12.0  12.0 - 15.0 g/dL   HCT 13.2  44.0 - 10.2 %   MCV 91.1  78.0 - 100.0 fl   MCHC 33.3  30.0 - 36.0 g/dL   RDW 72.5  36.6 - 44.0 %   Platelets 362.0  150.0 - 400.0 K/uL   Neutrophils Relative % 63.7  43.0 - 77.0 %   Lymphocytes Relative 28.8  12.0 - 46.0 %   Monocytes Relative 5.1  3.0 - 12.0 %   Eosinophils Relative 1.8  0.0 - 5.0 %   Basophils Relative 0.6  0.0 - 3.0 %   Neutro Abs 5.3  1.4 - 7.7 K/uL   Lymphs Abs 2.4  0.7 - 4.0 K/uL   Monocytes Absolute 0.4  0.1 - 1.0 K/uL   Eosinophils Absolute 0.1  0.0 - 0.7 K/uL   Basophils Absolute 0.1  0.0 - 0.1 K/uL  COMPREHENSIVE METABOLIC PANEL      Result Value Ref Range   Sodium 138  135 - 145 mEq/L   Potassium 4.2  3.5 - 5.1 mEq/L   Chloride 103  96 - 112 mEq/L   CO2 31  19 - 32 mEq/L   Glucose, Bld 107 (*) 70 - 99 mg/dL   BUN 11  6 - 23 mg/dL   Creatinine, Ser 0.8  0.4 - 1.2 mg/dL   Total Bilirubin 0.6  0.2 - 1.2 mg/dL   Alkaline Phosphatase 73  39 - 117 U/L   AST 17  0 - 37  U/L   ALT 12  0 - 35 U/L   Total Protein 8.1  6.0 - 8.3 g/dL   Albumin 4.5  3.5 - 5.2 g/dL   Calcium 9.8  8.4 - 34.7 mg/dL   GFR 42.59  >56.38 mL/min  HEMOGLOBIN A1C      Result Value Ref Range   Hemoglobin A1C 6.2  4.6 - 6.5 %  LIPID PANEL      Result Value Ref Range   Cholesterol 174  0 - 200 mg/dL   Triglycerides 75.6  0.0 - 149.0 mg/dL   HDL 43.32  >95.18 mg/dL   VLDL 84.1  0.0 - 66.0 mg/dL   LDL Cholesterol 630 (*) 0 - 99 mg/dL  Total CHOL/HDL Ratio 4     NonHDL 128.90    TSH      Result Value Ref Range   TSH 1.43  0.35 - 4.50 uIU/mL    A1C is down from 6.5 ! - doing better with that   Cholesterol is ok - could improve  She still eats high cholesterol foods   Patient Active Problem List   Diagnosis Date Noted  . Breast pain 07/01/2013  . Colon cancer screening 08/11/2012  . 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 Stress reaction 02/26/2011  . 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 Occult blood in stools 09/14/2010  . Other screening mammogram 08/28/2010  . Elevated blood protein 08/28/2010  . Dermatitis 06/24/2010  . Routine general medical examination at a health care facility 05/23/2010  . MVP (mitral valve prolapse)   . Chronic headaches   . Seasonal allergies   . Family history of breast cancer in female   . Hyperglycemia 04/04/2009  . HYPERLIPIDEMIA 11/22/2008  . Anemia, unspecified 02/12/2007  . OVERACTIVE BLADDER 01/11/2007  . FIBROCYSTIC BREAST DISEASE 01/11/2007   Past Medical History  Diagnosis Date  . MVP (mitral valve prolapse)   . Anemia   . Chronic headaches   . Seasonal allergies   . Fibrocystic breast disease   . Hyperglycemia     Diet controlled  . Family history of breast cancer in female     Daughter  . 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   Past Surgical History  Procedure Laterality Date  . Breast biopsy  03/2004    Negative  . Partial hysterectomy  09/1996    Fibroids  . Breast lumpectomy  12/1977  . Breast cyst  aspiration  05/2007    Bilateral-benign  . Ankle fracture surgery  05/2003   History  Substance Use Topics  . Smoking status: Never Smoker   . Smokeless tobacco: Never Used  . Alcohol Use: No   Family History  Problem Relation Age of Onset  . Diabetes Father   . Hypertension Mother   . Breast cancer Daughter     (inflammatory)-now has brain tumor  . Diabetes Paternal Grandmother   . Breast cancer      great aunt   Allergies  Allergen Reactions  . Eggs Or Egg-Derived Products     REACTION: SEVERE REACTION  . Hepatitis A Vaccine Other (See Comments)    ? Allergic reaction; itching,weakness almost immediate after taking 1st vaccine   Current Outpatient Prescriptions on File Prior to Visit  Medication Sig Dispense Refill  . acetaminophen (TYLENOL) 500 MG tablet Take 500 mg by mouth every 6 (six) hours as needed.        . cetirizine (ZYRTEC) 10 MG tablet Take 10 mg by mouth daily as needed.        . fexofenadine-pseudoephedrine (ALLEGRA-D) 60-120 MG per tablet Take 1 tablet by mouth 2 (two) times daily as needed.        Marland Kitchen glucose blood test strip 1 each by Other route 2 (two) times daily. Use as instructed       . ibuprofen (ADVIL,MOTRIN) 200 MG tablet Take 200 mg by mouth every 6 (six) hours as needed.        . Lancets 28G MISC by Does not apply route 2 (two) times daily.        . Multiple Vitamin (MULTIVITAMIN) tablet Take 1 tablet by mouth daily.        Marland Kitchen nystatin cream (MYCOSTATIN) Apply topically as directed.  Nystatin cream 100,000 units per gm apply to affected area once daily as directed.  30 g  1   No current facility-administered medications on file prior to visit.    Review of Systems Review of Systems  Constitutional: Negative for fever, appetite change, fatigue and unexpected weight change.  Eyes: Negative for pain and visual disturbance.  Respiratory: Negative for cough and shortness of breath.   Cardiovascular: Negative for cp or palpitations    Gastrointestinal:  Negative for nausea, diarrhea and constipation.  Genitourinary: Negative for urgency and frequency.  Skin: Negative for pallor or rash   Neurological: Negative for weakness, light-headedness, numbness and headaches.  Hematological: Negative for adenopathy. Does not bruise/bleed easily.  Psychiatric/Behavioral: Negative for dysphoric mood. The patient is not nervous/anxious.         Objective:   Physical Exam  Constitutional: She appears well-developed and well-nourished. No distress.  HENT:  Head: Normocephalic and atraumatic.  Right Ear: External ear normal.  Left Ear: External ear normal.  Nose: Nose normal.  Mouth/Throat: Oropharynx is clear and moist.  Eyes: Conjunctivae and EOM are normal. Pupils are equal, round, and reactive to light. Right eye exhibits no discharge. Left eye exhibits no discharge. No scleral icterus.  Neck: Normal range of motion. Neck supple. No JVD present. No thyromegaly present.  Cardiovascular: Normal rate, regular rhythm, normal heart sounds and intact distal pulses.  Exam reveals no gallop.   Pulmonary/Chest: Effort normal and breath sounds normal. No respiratory distress. She has no wheezes. She has no rales.  Abdominal: Soft. Bowel sounds are normal. She exhibits no distension and no mass. There is no tenderness.  Genitourinary: No breast swelling, tenderness, discharge or bleeding.  Breast exam: No mass, nodules, thickening, tenderness, bulging, retraction, inflamation, nipple discharge or skin changes noted.  No axillary or clavicular LA.      Musculoskeletal: She exhibits no edema and no tenderness.  Lymphadenopathy:    She has no cervical adenopathy.  Neurological: She is alert. She has normal reflexes. No cranial nerve deficit. She exhibits normal muscle tone. Coordination normal.  Skin: Skin is warm and dry. No rash noted. No erythema. No pallor.  Psychiatric: She has a normal mood and affect.          Assessment & Plan:   Problem List  Items Addressed This Visit     Other   HYPERLIPIDEMIA     Disc goals for lipids and reasons to control them Rev labs with pt Rev low sat fat diet in detail     Hyperglycemia      Improved with diet and exercise/ wt loss Lab Results  Component Value Date   HGBA1C 6.2 08/30/2013    Will continue to follow Rev low glycemic diet     Routine general medical examination at a health care facility - Primary     Reviewed health habits including diet and exercise and skin cancer prevention Reviewed appropriate screening tests for age  Also reviewed health mt list, fam hx and immunization status , as well as social and family history    Labs rev  See HPI Cannot get flu shot due to egg allergy    Colon cancer screening     Pt declines colonoscopy due to fear  She has had visit to Dr Juanda ChanceBrodie to discuss She agreed to do IFOB card and if it is pos this time would consider a colonosc    Relevant Orders      Fecal occult blood, imunochemical

## 2013-09-07 NOTE — Assessment & Plan Note (Signed)
Disc goals for lipids and reasons to control them Rev labs with pt Rev low sat fat diet in detail   

## 2013-09-07 NOTE — Patient Instructions (Signed)
Get your mammogram as planned  Do the IFOB card for colon cancer screening -let me know if you change your mind about a colonoscopy  For cholesterol  Avoid red meat/ fried foods/ egg yolks/ fatty breakfast meats/ butter, cheese and high fat dairy/ and shellfish   Keep working on healthy diet and exercise for weight loss

## 2013-09-07 NOTE — Progress Notes (Signed)
Pre visit review using our clinic review tool, if applicable. No additional management support is needed unless otherwise documented below in the visit note. 

## 2013-09-07 NOTE — Assessment & Plan Note (Addendum)
Pt declines colonoscopy due to fear  She has had visit to Dr Juanda ChanceBrodie to discuss She agreed to do IFOB card and if it is pos this time would consider a colonosc

## 2013-09-12 ENCOUNTER — Other Ambulatory Visit (INDEPENDENT_AMBULATORY_CARE_PROVIDER_SITE_OTHER): Payer: 59

## 2013-09-12 DIAGNOSIS — Z1211 Encounter for screening for malignant neoplasm of colon: Secondary | ICD-10-CM

## 2013-09-12 LAB — FECAL OCCULT BLOOD, IMMUNOCHEMICAL: Fecal Occult Bld: NEGATIVE

## 2013-09-30 ENCOUNTER — Ambulatory Visit
Admission: RE | Admit: 2013-09-30 | Discharge: 2013-09-30 | Disposition: A | Discharge status: Home / Self Care | Payer: 59 | Source: Ambulatory Visit

## 2013-09-30 DIAGNOSIS — Z1231 Encounter for screening mammogram for malignant neoplasm of breast: Secondary | ICD-10-CM

## 2013-11-30 ENCOUNTER — Encounter: Payer: Self-pay | Admitting: Family Medicine

## 2013-12-05 ENCOUNTER — Encounter: Payer: Self-pay | Admitting: Family Medicine

## 2014-01-13 ENCOUNTER — Encounter: Payer: Self-pay | Admitting: Family Medicine

## 2014-01-13 ENCOUNTER — Ambulatory Visit (INDEPENDENT_AMBULATORY_CARE_PROVIDER_SITE_OTHER): Payer: 59 | Admitting: Family Medicine

## 2014-01-13 ENCOUNTER — Other Ambulatory Visit: Payer: Self-pay | Admitting: Family Medicine

## 2014-01-13 VITALS — BP 130/70 | HR 64 | Temp 98.2°F | Ht 63.5 in | Wt 159.8 lb

## 2014-01-13 DIAGNOSIS — M546 Pain in thoracic spine: Secondary | ICD-10-CM | POA: Insufficient documentation

## 2014-01-13 MED ORDER — CYCLOBENZAPRINE HCL 10 MG PO TABS: 5.0000 mg | ORAL_TABLET | Freq: Three times a day (TID) | ORAL | Status: DC | PRN

## 2014-01-13 NOTE — Telephone Encounter (Signed)
Electronic refill request, not on med list, please advise  

## 2014-01-13 NOTE — Patient Instructions (Signed)
Use heat on the painful area 10 minutes at a time whenever you can  Massage the painful area when you can  Take aleve 2 pills twice daily with food as needed for pain (also helps inflammation)  Take flexeril up to three times daily (careful of sedation) - when not working or driving  Keep moving but do not heavy lift   Update if not starting to improve in a week or if worsening

## 2014-01-13 NOTE — Progress Notes (Signed)
Subjective:    Patient ID: Carolyn GableLinda Fay Matthews, female    DOB: 03-Jun-1955, 58 y.o.   MRN: [id number]  HPI Here with back pain /strain   Has had to pick up wet heavy clothes  Something in upper back "popped" (bottom of her neck)  Hurt a lot  Kept working and back continued to tighten up  Right in the middle  Even hurts to take a deep breath  No hand or arm symptoms  It hurts most to extend her neck/ look up  She lay flat in the bed last night -that helped temporarily   No meds otc  No ice or heat    Patient Active Problem List   Diagnosis Date Noted  . Breast pain 07/01/2013  . Colon cancer screening 08/11/2012  . 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 Stress reaction 02/26/2011  . 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 Occult blood in stools 09/14/2010  . Other screening mammogram 08/28/2010  . Elevated blood protein 08/28/2010  . Dermatitis 06/24/2010  . Routine general medical examination at a health care facility 05/23/2010  . MVP (mitral valve prolapse)   . Chronic headaches   . Seasonal allergies   . Family history of breast cancer in female   . Hyperglycemia 04/04/2009  . HYPERLIPIDEMIA 11/22/2008  . Anemia, unspecified 02/12/2007  . OVERACTIVE BLADDER 01/11/2007  . FIBROCYSTIC BREAST DISEASE 01/11/2007   Past Medical History  Diagnosis Date  . MVP (mitral valve prolapse)   . Anemia   . Chronic headaches   . Seasonal allergies   . Fibrocystic breast disease   . Hyperglycemia     Diet controlled  . Family history of breast cancer in female     Daughter  . 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   Past Surgical History  Procedure Laterality Date  . Breast biopsy  03/2004    Negative  . Partial hysterectomy  09/1996    Fibroids  . Breast lumpectomy  12/1977  . Breast cyst aspiration  05/2007    Bilateral-benign  . Ankle fracture surgery  05/2003   History  Substance Use Topics  . Smoking status: Never Smoker   . Smokeless tobacco: Never Used    . Alcohol Use: No   Family History  Problem Relation Age of Onset  . Diabetes Father   . Hypertension Mother   . Breast cancer Daughter     (inflammatory)-now has brain tumor  . Diabetes Paternal Grandmother   . Breast cancer      great aunt   Allergies  Allergen Reactions  . Eggs Or Egg-Derived Products     REACTION: SEVERE REACTION  . Hepatitis A Vaccine Other (See Comments)    ? Allergic reaction; itching,weakness almost immediate after taking 1st vaccine   Current Outpatient Prescriptions on File Prior to Visit  Medication Sig Dispense Refill  . cetirizine (ZYRTEC) 10 MG tablet Take 10 mg by mouth daily as needed.      . fexofenadine-pseudoephedrine (ALLEGRA-D) 60-120 MG per tablet Take 1 tablet by mouth 2 (two) times daily as needed.      Marland Kitchen. glucose blood test strip 1 each by Other route 2 (two) times daily. Use as instructed     . Lancets 28G MISC by Does not apply route 2 (two) times daily.      . mometasone (NASONEX) 50 MCG/ACT nasal spray Place 2 sprays into the nose daily. 2 sprays in each nostril per day as needed 17 g 3  .  Multiple Vitamin (MULTIVITAMIN) tablet Take 1 tablet by mouth daily.      Marland Kitchen. nystatin cream (MYCOSTATIN) Apply topically as directed. Nystatin cream 100,000 units per gm apply to affected area once daily as directed. 30 g 1   No current facility-administered medications on file prior to visit.      Review of Systems Review of Systems  Constitutional: Negative for fever, appetite change, fatigue and unexpected weight change.  Eyes: Negative for pain and visual disturbance.  Respiratory: Negative for cough and shortness of breath.   Cardiovascular: Negative for cp or palpitations    Gastrointestinal: Negative for nausea, diarrhea and constipation.  Genitourinary: Negative for urgency and frequency.  Skin: Negative for pallor or rash   MSK pos for neck and back pain  Neurological: Negative for weakness, light-headedness, numbness and headaches.   Hematological: Negative for adenopathy. Does not bruise/bleed easily.  Psychiatric/Behavioral: Negative for dysphoric mood. The patient is not nervous/anxious.         Objective:   Physical Exam  Constitutional: She appears well-developed and well-nourished. No distress.  HENT:  Head: Normocephalic and atraumatic.  Eyes: Conjunctivae and EOM are normal. Pupils are equal, round, and reactive to light. No scleral icterus.  Neck: Normal range of motion. Neck supple. No thyromegaly present.  Pain to fully extend neck and turn L   Cardiovascular: Normal rate and regular rhythm.   Pulmonary/Chest: Effort normal and breath sounds normal. No respiratory distress. She has no wheezes. She has no rales.  Musculoskeletal: She exhibits tenderness. She exhibits no edema.  Tenderness in R low cervical/ high thoracic musculature with spasm No bony tenderness Nl rom UEs No neuro s/s   Lymphadenopathy:    She has no cervical adenopathy.  Neurological: She is alert. She has normal reflexes. No cranial nerve deficit. She exhibits normal muscle tone. Coordination normal.  Skin: Skin is warm and dry. No rash noted. No erythema.  Psychiatric: She has a normal mood and affect.          Assessment & Plan:   Problem List Items Addressed This Visit      Other   Thoracic back pain - Primary    Suspect strain/ muscle  Flexeril as needed (warned of sed) nsaid with food prn  Massage/heat If no imp consider PT  Disc activity/lifting     Relevant Medications      cyclobenzaprine (FLEXERIL) tablet

## 2014-01-13 NOTE — Progress Notes (Signed)
Pre visit review using our clinic review tool, if applicable. No additional management support is needed unless otherwise documented below in the visit note. 

## 2014-01-13 NOTE — Telephone Encounter (Signed)
Please call and ask if she is taking it -thanks

## 2014-01-15 NOTE — Assessment & Plan Note (Signed)
Suspect strain/ muscle  Flexeril as needed (warned of sed) nsaid with food prn  Massage/heat If no imp consider PT  Disc activity/lifting

## 2014-01-16 NOTE — Telephone Encounter (Signed)
Sent pt a message asking if she is taking med or not

## 2014-01-17 NOTE — Telephone Encounter (Signed)
Pt only takes it as needed, per Bonita QuinLinda only refill it once. done

## 2014-02-23 ENCOUNTER — Other Ambulatory Visit (INDEPENDENT_AMBULATORY_CARE_PROVIDER_SITE_OTHER): Payer: 59 | Admitting: Internal Medicine

## 2014-02-23 DIAGNOSIS — J020 Streptococcal pharyngitis: Secondary | ICD-10-CM

## 2014-02-23 LAB — POCT RAPID STREP A (OFFICE): Rapid Strep A Screen: POSITIVE — AB

## 2014-02-23 MED ORDER — AMOXICILLIN-POT CLAVULANATE 875-125 MG PO TABS: 1.0000 | ORAL_TABLET | Freq: Two times a day (BID) | ORAL | Status: DC

## 2014-02-23 MED ORDER — ONDANSETRON HCL 8 MG PO TABS: 8.0000 mg | ORAL_TABLET | Freq: Three times a day (TID) | ORAL | Status: DC | PRN

## 2014-03-24 ENCOUNTER — Telehealth: Payer: Self-pay | Admitting: Family Medicine

## 2014-03-24 ENCOUNTER — Encounter: Payer: Self-pay | Admitting: Family Medicine

## 2014-03-24 MED ORDER — FLUCONAZOLE 150 MG PO TABS
150.0000 mg | ORAL_TABLET | Freq: Once | ORAL | Status: DC
Start: 2014-03-24 — End: 2014-04-18

## 2014-03-24 NOTE — Telephone Encounter (Signed)
Needs a yeast infx treated after taking abx and otc not working  See [employer] note

## 2014-04-11 ENCOUNTER — Encounter: Payer: Self-pay | Admitting: Internal Medicine

## 2014-04-18 ENCOUNTER — Encounter: Payer: Self-pay | Admitting: Family Medicine

## 2014-04-18 ENCOUNTER — Ambulatory Visit (INDEPENDENT_AMBULATORY_CARE_PROVIDER_SITE_OTHER): Payer: 59 | Admitting: Family Medicine

## 2014-04-18 VITALS — BP 112/64 | HR 84 | Temp 98.0°F | Wt 158.4 lb

## 2014-04-18 DIAGNOSIS — J302 Other seasonal allergic rhinitis: Secondary | ICD-10-CM

## 2014-04-18 NOTE — Progress Notes (Signed)
Pre visit review using our clinic review tool, if applicable. No additional management support is needed unless otherwise documented below in the visit note. 

## 2014-04-18 NOTE — Progress Notes (Signed)
BP 112/64 mmHg  Pulse 84  Temp(Src) 98 F (36.7 C)  Wt 158 lb 6.4 oz (71.85 kg)   CC: congestion  Subjective:    Patient ID: Carolyn Matthews, female    DOB: March 27, 1955, 59 y.o.   MRN: [id number]  HPI: Carolyn Matthews is a 59 y.o. female presenting on 04/18/2014 for Nasal Congestion   1d h/o ear pressure, rhinorrhea, congestion, mild cough and sneezing. Mildly water eyes, some malaise feeling. PNdrainage and sinus congestion.   No fevers/chills, tooth pain, headaches.   Tried zyrtec and pseudophed which has seemed to have helped. Also started nasonex pre emptively.  Husband with similar sxs.    Relevant past medical, surgical, family and social history reviewed and updated as indicated. Interim medical history since our last visit reviewed. Allergies and medications reviewed and updated. Current Outpatient Prescriptions on File Prior to Visit  Medication Sig  . busPIRone (BUSPAR) 15 MG tablet Take 0.5 tablets (7.5 mg total) by mouth 2 (two) times daily as needed.  . cetirizine (ZYRTEC) 10 MG tablet Take 10 mg by mouth daily as needed.    . fexofenadine-pseudoephedrine (ALLEGRA-D) 60-120 MG per tablet Take 1 tablet by mouth 2 (two) times daily as needed.    . mometasone (NASONEX) 50 MCG/ACT nasal spray Place 2 sprays into the nose daily. 2 sprays in each nostril per day as needed  . Multiple Vitamin (MULTIVITAMIN) tablet Take 1 tablet by mouth daily.    Marland Kitchen. nystatin cream (MYCOSTATIN) Apply topically as directed. Nystatin cream 100,000 units per gm apply to affected area once daily as directed.  Marland Kitchen. glucose blood test strip 1 each by Other route 2 (two) times daily. Use as instructed   . Lancets 28G MISC by Does not apply route 2 (two) times daily.    . ondansetron (ZOFRAN) 8 MG tablet Take 1 tablet (8 mg total) by mouth every 8 (eight) hours as needed for nausea or vomiting. (Patient not taking: Reported on 04/18/2014)   No current facility-administered medications on file prior to  visit.    Review of Systems Per HPI unless specifically indicated above     Objective:    BP 112/64 mmHg  Pulse 84  Temp(Src) 98 F (36.7 C)  Wt 158 lb 6.4 oz (71.85 kg)  Wt Readings from Last 3 Encounters:  04/18/14 158 lb 6.4 oz (71.85 kg)  01/13/14 159 lb 12 oz (72.462 kg)  09/07/13 166 lb 8 oz (75.524 kg)    Physical Exam  Constitutional: She appears well-developed and well-nourished. No distress.  HENT:  Head: Normocephalic and atraumatic.  Right Ear: Hearing, tympanic membrane, external ear and ear canal normal.  Left Ear: Hearing, tympanic membrane, external ear and ear canal normal.  Nose: Mucosal edema (pale mildly boggy turbinates) present. No rhinorrhea. Right sinus exhibits no maxillary sinus tenderness and no frontal sinus tenderness. Left sinus exhibits no maxillary sinus tenderness and no frontal sinus tenderness.  Mouth/Throat: Uvula is midline, oropharynx is clear and moist and mucous membranes are normal. No oropharyngeal exudate, posterior oropharyngeal edema, posterior oropharyngeal erythema or tonsillar abscesses.  Eyes: Conjunctivae and EOM are normal. Pupils are equal, round, and reactive to light. No scleral icterus.  Neck: Normal range of motion. Neck supple.  Cardiovascular: Normal rate, regular rhythm, normal heart sounds and intact distal pulses.   No murmur heard. Pulmonary/Chest: Effort normal and breath sounds normal. No respiratory distress. She has no wheezes. She has no rales.  Lymphadenopathy:    She has  no cervical adenopathy.  Skin: Skin is warm and dry. No rash noted.  Nursing note and vitals reviewed.     Assessment & Plan:   Problem List Items Addressed This Visit    Seasonal allergies - Primary    Anticipate flare of seasonal allergic rhinitis.  Treatment discussed. rec continue nasonex, zyrtec, with pseudophed prn. Discussed allergen avoidance and nasal saline irrigation when around pollen or other triggers. Update if fever >101  or worsening sxs despite above treatment. Pt agrees with plan.          Follow up plan: Return if symptoms worsen or fail to improve.

## 2014-04-18 NOTE — Patient Instructions (Signed)
I think allergies are flaring up. Continue zyrtec, pseudophed and nasonex. Nasal saline as needed especially if exposed to pollen. Let us know if fever >101 or worsening congestion headaches or other concerns.

## 2014-04-18 NOTE — Assessment & Plan Note (Signed)
Anticipate flare of seasonal allergic rhinitis.  Treatment discussed. rec continue nasonex, zyrtec, with pseudophed prn. Discussed allergen avoidance and nasal saline irrigation when around pollen or other triggers. Update if fever >101 or worsening sxs despite above treatment. Pt agrees with plan.

## 2014-04-20 ENCOUNTER — Telehealth: Payer: Self-pay | Admitting: Family Medicine

## 2014-04-20 MED ORDER — GUAIFENESIN-CODEINE 100-10 MG/5ML PO SYRP: 5.0000 mL | ORAL_SOLUTION | Freq: Two times a day (BID) | ORAL | Status: DC | PRN

## 2014-04-20 NOTE — Telephone Encounter (Signed)
Pt seen in office today - not doing any better, worsening tooth pain, drainage, some nausea/diarrhea. May be developing sinus infection , but discussed likely still viral as only 4-5d in duration. No fevers. cheratussin has helped int he past - prescribed today. Advised if fever or progressively worsening to update me for abx course, or if sxs ongoing into next week to update me for abx course to cover bacterial sinusitis.

## 2014-05-25 ENCOUNTER — Encounter: Payer: Self-pay | Admitting: Family Medicine

## 2014-05-25 ENCOUNTER — Ambulatory Visit (INDEPENDENT_AMBULATORY_CARE_PROVIDER_SITE_OTHER): Payer: 59 | Admitting: Family Medicine

## 2014-05-25 VITALS — BP 141/77 | HR 71 | Temp 97.9°F | Ht 63.5 in | Wt 157.0 lb

## 2014-05-25 DIAGNOSIS — I889 Nonspecific lymphadenitis, unspecified: Secondary | ICD-10-CM | POA: Diagnosis not present

## 2014-05-25 MED ORDER — FLUCONAZOLE 150 MG PO TABS: 150.0000 mg | ORAL_TABLET | Freq: Once | ORAL | Status: DC

## 2014-05-25 MED ORDER — AMOXICILLIN-POT CLAVULANATE 875-125 MG PO TABS: 1.0000 | ORAL_TABLET | Freq: Two times a day (BID) | ORAL | Status: DC

## 2014-05-25 NOTE — Progress Notes (Signed)
Subjective:    Patient ID: Carolyn Matthews, female    DOB: 07-Oct-1955, 59 y.o.   MRN: [id number]  HPI  59 year old female presents with new onset lesion on right neck in 24 hours. Last night noted swelling in right neck, sore , increase pain with eating , moving jaw.  She has been having allergies (nasal congestion, dry cough post nasal drip, occ ST but none now, some right ear pain.) Took allergy meds. Applied hot compresses this helped it decrease in size.  Has been applied antibiotics ointment to rash behind left ear and on chest wall, itchy x few weeks. Dry flaky skin, no pustules.   No fever.  No flu like. No night sweats, no weight loss.  No tooth issues.    Review of Systems  Constitutional: Negative for fever, chills, activity change, appetite change, fatigue and unexpected weight change.  HENT: Negative for ear pain.   Eyes: Negative for pain.  Respiratory: Negative for chest tightness and shortness of breath.   Cardiovascular: Negative for chest pain, palpitations and leg swelling.  Gastrointestinal: Negative for abdominal pain.  Genitourinary: Negative for dysuria.       Objective:   Physical Exam  Constitutional: Vital signs are normal. She appears well-developed and well-nourished. She is cooperative.  Non-toxic appearance. She does not appear ill. No distress.  HENT:  Head: Normocephalic.  Right Ear: Hearing, tympanic membrane, external ear and ear canal normal. Tympanic membrane is not erythematous, not retracted and not bulging.  Left Ear: Hearing, tympanic membrane, external ear and ear canal normal. Tympanic membrane is not erythematous, not retracted and not bulging.  Nose: Mucosal edema and rhinorrhea present. Right sinus exhibits no maxillary sinus tenderness and no frontal sinus tenderness. Left sinus exhibits no maxillary sinus tenderness and no frontal sinus tenderness.  Mouth/Throat: Uvula is midline, oropharynx is clear and moist and mucous  membranes are normal.  Pale mucosa suggestive of allergy cause  Eyes: Conjunctivae, EOM and lids are normal. Pupils are equal, round, and reactive to light. Lids are everted and swept, no foreign bodies found.  Neck: Trachea normal and normal range of motion. Neck supple. Carotid bruit is not present. No thyroid mass and no thyromegaly present.  Cardiovascular: Normal rate, regular rhythm, S1 normal, S2 normal, normal heart sounds, intact distal pulses and normal pulses.  Exam reveals no gallop and no friction rub.   No murmur heard. Pulmonary/Chest: Effort normal and breath sounds normal. No tachypnea. No respiratory distress. She has no decreased breath sounds. She has no wheezes. She has no rhonchi. She has no rales.  Abdominal: Soft. Normal appearance and bowel sounds are normal. There is no tenderness.  Lymphadenopathy:       Head (right side): No submental, no submandibular, no tonsillar, no preauricular, no posterior auricular and no occipital adenopathy present.       Head (left side): No submental, no submandibular, no tonsillar, no preauricular, no posterior auricular and no occipital adenopathy present.    She has cervical adenopathy.       Right cervical: Superficial cervical adenopathy present. No deep cervical and no posterior cervical adenopathy present.      Left cervical: No superficial cervical, no deep cervical and no posterior cervical adenopathy present.       Right: No supraclavicular and no epitrochlear adenopathy present.       Left: No supraclavicular and no epitrochlear adenopathy present.  Right lymph node 3 x 3 cm,  tender to palpation, no  redness.  Neurological: She is alert.  Skin: Skin is warm, dry and intact. No rash noted.  Dry flaky, thickened slightly erythematous region behind right ear with small amount of  serosanguinous drainage.  Psychiatric: Her speech is normal and behavior is normal. Judgment and thought content normal. Her mood appears not anxious.  Cognition and memory are normal. She does not exhibit a depressed mood.          Assessment & Plan:

## 2014-05-25 NOTE — Progress Notes (Signed)
Pre visit review using our clinic review tool, if applicable. No additional management support is needed unless otherwise documented below in the visit note. 

## 2014-05-25 NOTE — Patient Instructions (Addendum)
Complete antibiotics. Ibuprofen 800 mg every 8 hours for pain and swelling as needed. If yeast infection, use OTC cream while on antibiotics , then when completed take diflucan.  Allow a few weeks for lesion to resolve. If not gone, follow up with Dr. Leonard SchwartzB or PCP.

## 2014-05-26 ENCOUNTER — Ambulatory Visit (AMBULATORY_SURGERY_CENTER): Payer: Self-pay | Admitting: *Deleted

## 2014-05-26 ENCOUNTER — Telehealth: Payer: Self-pay | Admitting: *Deleted

## 2014-05-26 VITALS — Ht 64.0 in | Wt 159.0 lb

## 2014-05-26 DIAGNOSIS — Z1211 Encounter for screening for malignant neoplasm of colon: Secondary | ICD-10-CM

## 2014-05-26 DIAGNOSIS — I889 Nonspecific lymphadenitis, unspecified: Secondary | ICD-10-CM | POA: Insufficient documentation

## 2014-05-26 NOTE — Assessment & Plan Note (Signed)
No red flags suggesting malignancy.  Rash with breakdown of skin barrier nearby.. Possible local response to infection.  Will treat with Augmentin but if not improving consider further eval.

## 2014-05-26 NOTE — Progress Notes (Signed)
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 She states she gets flu like symptoms all except fever, no swelling or anaphylaxis  with eggs. No soy allergy. TE to [employer] [facility] crna about which sedation she should receive  Pt states she is hard to wake after any surgery, sedation No home 02 use Pt does take a diet pill. Unsure if it contains phentermine. She will check and call us back . It is an all natural diet supplement  emmi declined, daughter in PV with Pt. Questions answered and all instructions discussed with both pt and daughter

## 2014-05-26 NOTE — Telephone Encounter (Signed)
[facility] This pt has a colon scheduled for Friday 06-09-14.  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 She states she gets flu like symptoms all except fever, no swelling or anaphylaxis  with eggs. No soy allergy. She does not take the flu vaccine due to her egg allergy per her PCP.  Can you please review her chart and see which sedation best suits her.I  told her I will call her after you look at her chart and let me know what you think  Have a great weekend. Hilda LiasMarie PV

## 2014-05-31 NOTE — Telephone Encounter (Signed)
Carolyn Matthews,  I was finally able to speak with Ms. Carolyn Matthews this evening and allayed her fears.  We will go ahead with Diprivan.  Thanks,  Jonny RuizJohn

## 2014-06-02 NOTE — Telephone Encounter (Signed)
Proceed as scheduled. Thanks John.  Hilda LiasMarie PV

## 2014-06-09 ENCOUNTER — Ambulatory Visit (AMBULATORY_SURGERY_CENTER): Payer: 59 | Admitting: Internal Medicine

## 2014-06-09 ENCOUNTER — Encounter: Payer: Self-pay | Admitting: Internal Medicine

## 2014-06-09 VITALS — BP 176/90 | HR 77 | Temp 96.5°F | Resp 18 | Ht 63.5 in | Wt 153.0 lb

## 2014-06-09 DIAGNOSIS — Z1211 Encounter for screening for malignant neoplasm of colon: Secondary | ICD-10-CM

## 2014-06-09 MED ORDER — SODIUM CHLORIDE 0.9 % IV SOLN: 500.0000 mL | INTRAVENOUS | Status: DC

## 2014-06-09 NOTE — Progress Notes (Signed)
Report to PACU, RN, vss, BBS= Clear.  

## 2014-06-09 NOTE — Patient Instructions (Addendum)
No polyps or cancer seen!  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 This is not a problem.  The lower part of the colon near the rectum is tacked down - probably adhesions or scar tissue from hysterectomy.  Next routine colonoscopy/screening test in 10 years - 2026  I appreciate the opportunity to care for you. Iva Booparl E. Gessner, MD, FACG   YOU HAD AN ENDOSCOPIC PROCEDURE TODAY AT THE  ENDOSCOPY CENTER:   Refer to the procedure report that was given to you for any specific questions about what was found during the examination.  If the procedure report does not answer your questions, please call your gastroenterologist to clarify.  If you requested that your care partner not be given the details of your procedure findings, then the procedure report has been included in a sealed envelope for you to review at your convenience later.  YOU SHOULD EXPECT: Some feelings of bloating in the abdomen. Passage of more gas than usual.  Walking can help get rid of the air that was put into your GI tract during the procedure and reduce the bloating. If you had a lower endoscopy (such as a colonoscopy or flexible sigmoidoscopy) you may notice spotting of blood in your stool or on the toilet paper. If you underwent a bowel prep for your procedure, you may not have a normal bowel movement for a few days.  Please Note:  You might notice some irritation and congestion in your nose or some drainage.  This is from the oxygen used during your procedure.  There is no need for concern and it should clear up in a day or so.  SYMPTOMS TO REPORT IMMEDIATELY:   Following lower endoscopy (colonoscopy or flexible sigmoidoscopy):  Excessive amounts of blood in the stool  Significant tenderness or worsening of abdominal pains  Swelling of the abdomen that is new, acute  Fever of 100F or higher   Following upper endoscopy (EGD)  Vomiting of blood or coffee ground material  New  chest pain or pain under the shoulder blades  Painful or persistently difficult swallowing  New shortness of breath  Fever of 100F or higher  Black, tarry-looking stools  For urgent or emergent issues, a gastroenterologist can be reached at any hour by calling [phone].   DIET: Your first meal following the procedure should be a small meal and then it is ok to progress to your normal diet. Heavy or fried foods are harder to digest and may make you feel nauseous or bloated.  Likewise, meals heavy in dairy and vegetables can increase bloating.  Drink plenty of fluids but you should avoid alcoholic beverages for 24 hours.  ACTIVITY:  You should plan to take it easy for the rest of today and you should NOT DRIVE or use heavy machinery until tomorrow (because of the sedation medicines used during the test).    FOLLOW UP: Our staff will call the number listed on your records the next business day following your procedure to check on you and address any questions or concerns that you may have regarding the information given to you following your procedure. If we do not reach you, we will leave a message.  However, if you are feeling well and you are not experiencing any problems, there is no need to return our call.  We will assume that you have returned to your regular daily activities without incident.  If any biopsies were taken you will  be contacted by phone or by letter within the next 1-3 weeks.  Please call us at [phone] if you have not heard about the biopsies in 3 weeks.    SIGNATURES/CONFIDENTIALITY: You and/or your care partner have signed paperwork which will be entered into your electronic medical record.  These signatures attest to the fact that that the information above on your After Visit Summary has been reviewed and is understood.  Full responsibility of the confidentiality of this discharge information lies with you and/or your care-partner.

## 2014-06-09 NOTE — Op Note (Signed)
[facility] [address] [place], [id number]   COLONOSCOPY PROCEDURE REPORT  PATIENT: Carolyn Matthews, Carolyn Matthews  MR#: [id number] BIRTHDATE: [birth year] , 58  yrs. old GENDER: female ENDOSCOPIST: Iva Booparl E Gessner, MD, [facility] PROCEDURE DATE:  06/09/2014 PROCEDURE:   Colonoscopy, screening First Screening Colonoscopy - Avg.  risk and is 50 yrs.  old or older Yes.  Prior Negative Screening - Now for repeat screening. N/A  History of Adenoma - Now for follow-up colonoscopy & has been > or = to 3 yrs.  N/A ASA CLASS:   Class II INDICATIONS:Screening for colonic neoplasia and Colorectal Neoplasm Risk Assessment for this procedure is average risk. MEDICATIONS: Propofol 500 mg IV and Monitored anesthesia care  DESCRIPTION OF PROCEDURE:   After the risks benefits and alternatives of the procedure were thoroughly explained, informed consent was obtained.  The digital rectal exam revealed no abnormalities of the rectum.   The LB [id number]  endoscope was introduced through the anus and advanced to the cecum, which was identified by both the appendix and ileocecal valve. No adverse events experienced.   Limited by a tortuous colon.   The quality of the prep was excellent.  (MiraLax was used)  The instrument was then slowly withdrawn as the colon was fully examined.      COLON FINDINGS: Melanosis coli was found throughout the entire examined colon. The sigmoid colon was angulated and tortuos and somewhat fixed. Pediatric scope required to pass.  The examination was otherwise normal.  Retroflexed views revealed no abnormalities. The time to cecum = 8.2 Withdrawal time = 19.9   The scope was withdrawn and the procedure completed. COMPLICATIONS: There were no immediate complications.  ENDOSCOPIC IMPRESSION: 1.   Melanosis coli was found throughout the entire examined colon and sigmoid is angulated and fixed requiring pediatric colonoscopy to pass 2.   The examination was  otherwise normal - excellent prep - first screening  RECOMMENDATIONS: Repeat Colonscopy in 10 years.  eSigned:  Iva Booparl E Gessner, MD, [facility] 06/09/2014 2:07 PM   cc: The Patient and Roxy MannsMarne Tower, MD

## 2014-06-12 ENCOUNTER — Telehealth: Payer: Self-pay | Admitting: *Deleted

## 2014-06-12 NOTE — Telephone Encounter (Signed)
  Follow up Call-  Call back number 06/09/2014  Post procedure Call Back phone  # [phone]  Permission to leave phone message Yes     Patient questions:  Message left to call us if necessary.

## 2014-07-26 ENCOUNTER — Other Ambulatory Visit: Payer: Self-pay

## 2014-07-26 DIAGNOSIS — Z1231 Encounter for screening mammogram for malignant neoplasm of breast: Secondary | ICD-10-CM

## 2014-08-18 ENCOUNTER — Ambulatory Visit (INDEPENDENT_AMBULATORY_CARE_PROVIDER_SITE_OTHER): Payer: 59 | Admitting: Obstetrics & Gynecology

## 2014-08-18 ENCOUNTER — Ambulatory Visit: Payer: 59 | Admitting: Obstetrics & Gynecology

## 2014-08-18 ENCOUNTER — Encounter: Payer: Self-pay | Admitting: Obstetrics & Gynecology

## 2014-08-18 VITALS — BP 148/80 | HR 80 | Ht 64.0 in | Wt 155.0 lb

## 2014-08-18 DIAGNOSIS — B9689 Other specified bacterial agents as the cause of diseases classified elsewhere: Secondary | ICD-10-CM

## 2014-08-18 DIAGNOSIS — N898 Other specified noninflammatory disorders of vagina: Secondary | ICD-10-CM

## 2014-08-18 DIAGNOSIS — N760 Acute vaginitis: Secondary | ICD-10-CM

## 2014-08-18 DIAGNOSIS — A499 Bacterial infection, unspecified: Secondary | ICD-10-CM | POA: Diagnosis not present

## 2014-08-18 MED ORDER — METRONIDAZOLE 500 MG PO TABS: 500.0000 mg | ORAL_TABLET | Freq: Two times a day (BID) | ORAL | Status: DC

## 2014-08-18 NOTE — Progress Notes (Signed)
Vaginal irritation, sometimes has discharge, sometimes with fishy odor.  This has been going on for about year. Painful intercourse.

## 2014-08-18 NOTE — Progress Notes (Signed)
   CLINIC ENCOUNTER NOTE  History:  59 y.o. G2P2 here today for evaluation of abnormal vaginal discharge x 2 weeks.  Reports having similar episodes of BV in past.  Has pruritus and malodorous yellow discharge.  No bleeding, pelvic pain or other concerns. She is s/p hysterectomy several years ago for fibroids.   Past Medical History  Diagnosis Date  . MVP (mitral valve prolapse)   . Anemia   . Chronic headaches   . Seasonal allergies   . Fibrocystic breast disease   . Hyperglycemia     Diet controlled  . Family history of breast cancer in female     Daughter  . 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 Allergy     seasonal  . GERD (gastroesophageal reflux disease)     Past Surgical History  Procedure Laterality Date  . Breast biopsy  03/2004    Negative  . Vaginal hysterectomy  09/1996    Fibroids  . Breast lumpectomy  12/1977  . Breast cyst aspiration  05/2007    Bilateral-benign  . Ankle fracture surgery  05/2003    The following portions of the patient's history were reviewed and updated as appropriate: allergies, current medications, past family history, past medical history, past social history, past surgical history and problem list.   Health Maintenance:  Normal mammogram in 2015.   Review of Systems:  Pertinent items are noted in HPI. Comprehensive review of systems was otherwise negative.  Objective:  Physical Exam BP 148/80 mmHg  Pulse 80  Ht 5\' 4"  (1.626 m)  Wt 155 lb (70.308 kg)  BMI 26.59 kg/m2 CONSTITUTIONAL: Well-developed, well-nourished female in no acute distress.  HENT:  Normocephalic, atraumatic. External right and left ear normal. Oropharynx is clear and moist EYES: Conjunctivae and EOM are normal. Pupils are equal, round, and reactive to light. No scleral icterus.  NECK: Normal range of motion, supple, no masses SKIN: Skin is warm and dry. No rash noted. Not diaphoretic. No erythema. No pallor. NEUROLGIC: Alert and oriented to person, place,  and time. Normal reflexes, muscle tone coordination. No cranial nerve deficit noted. PSYCHIATRIC: Normal mood and affect. Normal behavior. Normal judgment and thought content. CARDIOVASCULAR: Normal heart rate noted RESPIRATORY: Effort and breath sounds normal, no problems with respiration noted ABDOMEN: Soft, no distention noted.   PELVIC: Normal appearing external genitalia; normal appearing vaginal mucosa and cuff. Yellow malodorous discharge noted, wet prep obtained.   MUSCULOSKELETAL: Normal range of motion. No edema noted.  Assessment & Plan:  Metronidazole prescribed for presumptive BV, also given dose of one day Monistat if yeast infection occurs after antibiotics. Proper vulvar hygiene emphasized: discussed avoidance of perfumed soaps, detergents, lotions and any type of douches; in addition to wearing cotton underwear and no underwear at night.  Also recommended cleaning front to back, voiding and cleaning up after intercourse.   Recommended RepHresh OTC regimen to help reestablish vaginal pH balance. Routine preventative health maintenance measures emphasized. Please refer to After Visit Summary for other counseling recommendations.     Jaynie CollinsUGONNA  Whittaker Lenis, MD, FACOG Attending Obstetrician & Gynecologist Center for [facility], [facility]

## 2014-08-19 LAB — WET PREP, GENITAL
Trich, Wet Prep: NONE SEEN
WBC WET PREP: NONE SEEN
Yeast Wet Prep HPF POC: NONE SEEN

## 2014-09-04 ENCOUNTER — Telehealth: Payer: Self-pay | Admitting: Family Medicine

## 2014-09-04 DIAGNOSIS — R739 Hyperglycemia, unspecified: Secondary | ICD-10-CM

## 2014-09-04 DIAGNOSIS — Z0000 Encounter for general adult medical examination without abnormal findings: Secondary | ICD-10-CM

## 2014-09-04 NOTE — Telephone Encounter (Signed)
-----   Message from Alvina Chou sent at 08/30/2014  6:14 PM EDT ----- Regarding: Lab orders for Tuesday, 8.2.16 Patient is scheduled for CPX labs, please order future labs, Thanks , Camelia Eng

## 2014-09-05 ENCOUNTER — Other Ambulatory Visit (INDEPENDENT_AMBULATORY_CARE_PROVIDER_SITE_OTHER): Payer: 59

## 2014-09-05 DIAGNOSIS — Z0000 Encounter for general adult medical examination without abnormal findings: Secondary | ICD-10-CM | POA: Diagnosis not present

## 2014-09-05 DIAGNOSIS — R739 Hyperglycemia, unspecified: Secondary | ICD-10-CM

## 2014-09-05 LAB — CBC WITH DIFFERENTIAL/PLATELET
BASOS PCT: 0.4 % (ref 0.0–3.0)
Basophils Absolute: 0 10*3/uL (ref 0.0–0.1)
Eosinophils Absolute: 0.1 10*3/uL (ref 0.0–0.7)
Eosinophils Relative: 1.8 % (ref 0.0–5.0)
HCT: 35.8 % — ABNORMAL LOW (ref 36.0–46.0)
Hemoglobin: 11.7 g/dL — ABNORMAL LOW (ref 12.0–15.0)
LYMPHS ABS: 1.9 10*3/uL (ref 0.7–4.0)
Lymphocytes Relative: 28.6 % (ref 12.0–46.0)
MCHC: 32.8 g/dL (ref 30.0–36.0)
MCV: 92.7 fl (ref 78.0–100.0)
MONOS PCT: 5.1 % (ref 3.0–12.0)
Monocytes Absolute: 0.3 10*3/uL (ref 0.1–1.0)
Neutro Abs: 4.3 10*3/uL (ref 1.4–7.7)
Neutrophils Relative %: 64.1 % (ref 43.0–77.0)
Platelets: 336 10*3/uL (ref 150.0–400.0)
RBC: 3.86 Mil/uL — AB (ref 3.87–5.11)
RDW: 14.1 % (ref 11.5–15.5)
WBC: 6.8 10*3/uL (ref 4.0–10.5)

## 2014-09-05 LAB — COMPREHENSIVE METABOLIC PANEL
ALK PHOS: 62 U/L (ref 39–117)
ALT: 16 U/L (ref 0–35)
AST: 17 U/L (ref 0–37)
Albumin: 4.5 g/dL (ref 3.5–5.2)
BILIRUBIN TOTAL: 0.6 mg/dL (ref 0.2–1.2)
BUN: 12 mg/dL (ref 6–23)
CALCIUM: 9.8 mg/dL (ref 8.4–10.5)
CO2: 33 meq/L — AB (ref 19–32)
CREATININE: 0.75 mg/dL (ref 0.40–1.20)
Chloride: 104 mEq/L (ref 96–112)
GFR: 102.2 mL/min (ref >60.00)
GLUCOSE: 102 mg/dL — AB (ref 70–99)
Potassium: 4.6 mEq/L (ref 3.5–5.1)
Sodium: 141 mEq/L (ref 135–145)
Total Protein: 7.5 g/dL (ref 6.0–8.3)

## 2014-09-05 LAB — LIPID PANEL
CHOL/HDL RATIO: 4
Cholesterol: 195 mg/dL (ref 0–200)
HDL: 55 mg/dL (ref >39.00)
LDL CALC: 127 mg/dL — AB (ref 0–99)
NonHDL: 139.54
TRIGLYCERIDES: 65 mg/dL (ref 0.0–149.0)
VLDL: 13 mg/dL (ref 0.0–40.0)

## 2014-09-05 LAB — HEMOGLOBIN A1C: Hgb A1c MFr Bld: 5.9 % (ref 4.6–6.5)

## 2014-09-05 LAB — TSH: TSH: 1.83 u[IU]/mL (ref 0.35–4.50)

## 2014-09-08 ENCOUNTER — Ambulatory Visit (INDEPENDENT_AMBULATORY_CARE_PROVIDER_SITE_OTHER): Payer: 59 | Admitting: Family Medicine

## 2014-09-08 ENCOUNTER — Encounter: Payer: Self-pay | Admitting: Family Medicine

## 2014-09-08 VITALS — BP 130/68 | HR 72 | Temp 98.8°F | Ht 63.5 in | Wt 157.5 lb

## 2014-09-08 DIAGNOSIS — R739 Hyperglycemia, unspecified: Secondary | ICD-10-CM | POA: Diagnosis not present

## 2014-09-08 DIAGNOSIS — Z0000 Encounter for general adult medical examination without abnormal findings: Secondary | ICD-10-CM

## 2014-09-08 DIAGNOSIS — E785 Hyperlipidemia, unspecified: Secondary | ICD-10-CM

## 2014-09-08 MED ORDER — BUSPIRONE HCL 15 MG PO TABS: 7.5000 mg | ORAL_TABLET | Freq: Two times a day (BID) | ORAL | Status: DC | PRN

## 2014-09-08 NOTE — Progress Notes (Signed)
Pre visit review using our clinic review tool, if applicable. No additional management support is needed unless otherwise documented below in the visit note. 

## 2014-09-08 NOTE — Assessment & Plan Note (Addendum)
Reviewed health habits including diet and exercise and skin cancer prevention Reviewed appropriate screening tests for age  Also reviewed health mt list, fam hx and immunization status , as well as social and family history   See HPI Labs reviewed in detail  Pt has mammogram planned  Enc further exercise and healthy diet

## 2014-09-08 NOTE — Progress Notes (Signed)
Subjective:    Patient ID: Carolyn Matthews, female    DOB: Mar 13, 1955, 59 y.o.   MRN: [id number]  HPI Here for health maintenance exam and to review chronic medical problems    Doing pretty good overall  Taking care of herself  Moved into her mom's house to help her - has dementia    Wt is up 2 lb with bmi of 27  Hep C/HIV screen-declines/not high risk   Pap 11/05 Had a hysterectomy  Went to gyn for vaginitis-had and abx from her gyn - better now   [facility] 8/15 neg-has an appt for next months  Self exam- no lumps   Td 1/08  Flu shot - does not take   colonosc nl 5/16 - 10 year recall  Glad it is over with  Had a small colon - had to use a pediatric size scope   Results for orders placed or performed in visit on 09/05/14  Hemoglobin A1c  Result Value Ref Range   Hgb A1c MFr Bld 5.9 4.6 - 6.5 %  Lipid panel  Result Value Ref Range   Cholesterol 195 0 - 200 mg/dL   Triglycerides 82.9 0.0 - 149.0 mg/dL   HDL 56.21 >30.86 mg/dL   VLDL 57.8 0.0 - 46.9 mg/dL   LDL Cholesterol 629 (H) 0 - 99 mg/dL   Total CHOL/HDL Ratio 4    NonHDL 139.54   TSH  Result Value Ref Range   TSH 1.83 0.35 - 4.50 uIU/mL  CBC with Differential/Platelet  Result Value Ref Range   WBC 6.8 4.0 - 10.5 K/uL   RBC 3.86 (L) 3.87 - 5.11 Mil/uL   Hemoglobin 11.7 (L) 12.0 - 15.0 g/dL   HCT 52.8 (L) 41.3 - 24.4 %   MCV 92.7 78.0 - 100.0 fl   MCHC 32.8 30.0 - 36.0 g/dL   RDW 01.0 27.2 - 53.6 %   Platelets 336.0 150.0 - 400.0 K/uL   Neutrophils Relative % 64.1 43.0 - 77.0 %   Lymphocytes Relative 28.6 12.0 - 46.0 %   Monocytes Relative 5.1 3.0 - 12.0 %   Eosinophils Relative 1.8 0.0 - 5.0 %   Basophils Relative 0.4 0.0 - 3.0 %   Neutro Abs 4.3 1.4 - 7.7 K/uL   Lymphs Abs 1.9 0.7 - 4.0 K/uL   Monocytes Absolute 0.3 0.1 - 1.0 K/uL   Eosinophils Absolute 0.1 0.0 - 0.7 K/uL   Basophils Absolute 0.0 0.0 - 0.1 K/uL  Comprehensive metabolic panel  Result Value Ref Range   Sodium 141 135 - 145  mEq/L   Potassium 4.6 3.5 - 5.1 mEq/L   Chloride 104 96 - 112 mEq/L   CO2 33 (H) 19 - 32 mEq/L   Glucose, Bld 102 (H) 70 - 99 mg/dL   BUN 12 6 - 23 mg/dL   Creatinine, Ser 6.44 0.40 - 1.20 mg/dL   Total Bilirubin 0.6 0.2 - 1.2 mg/dL   Alkaline Phosphatase 62 39 - 117 U/L   AST 17 0 - 37 U/L   ALT 16 0 - 35 U/L   Total Protein 7.5 6.0 - 8.3 g/dL   Albumin 4.5 3.5 - 5.2 g/dL   Calcium 9.8 8.4 - 03.4 mg/dL   GFR 742.59 >56.38 mL/min     Is really working on sugar intake - and her A1C is down!   Cholesterol Lab Results  Component Value Date   CHOL 195 09/05/2014   CHOL 174 08/30/2013   CHOL 178 08/03/2012  Lab Results  Component Value Date   HDL 55.00 09/05/2014   HDL 45.10 08/30/2013   HDL 44.10 08/03/2012   Lab Results  Component Value Date   LDLCALC 127* 09/05/2014   LDLCALC 111* 08/30/2013   LDLCALC 118* 08/03/2012   Lab Results  Component Value Date   TRIG 65.0 09/05/2014   TRIG 91.0 08/30/2013   TRIG 80.0 08/03/2012   Lab Results  Component Value Date   CHOLHDL 4 09/05/2014   CHOLHDL 4 08/30/2013   CHOLHDL 4 08/03/2012   No results found for: LDLDIRECT  Risk ratio is the same  HDL is up !  She is exercising and doing well with that   Patient Active Problem List   Diagnosis Date Noted  . Cervical lymphadenitis 05/26/2014  . Thoracic back pain 01/13/2014  . Breast pain 07/01/2013  . Colon cancer screening 08/11/2012  . 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 Stress reaction 02/26/2011  . 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 Occult blood in stools 09/14/2010  . Other screening mammogram 08/28/2010  . Elevated blood protein 08/28/2010  . Dermatitis 06/24/2010  . Routine general medical examination at a health care facility 05/23/2010  . MVP (mitral valve prolapse)   . Chronic headaches   . Seasonal allergies   . Family history of breast cancer in female   . Hyperglycemia 04/04/2009  . Hyperlipidemia 11/22/2008  . Anemia, unspecified  02/12/2007  . OVERACTIVE BLADDER 01/11/2007  . FIBROCYSTIC BREAST DISEASE 01/11/2007   Past Medical History  Diagnosis Date  . MVP (mitral valve prolapse)   . Anemia   . Chronic headaches   . Seasonal allergies   . Fibrocystic breast disease   . Hyperglycemia     Diet controlled  . Family history of breast cancer in female     Daughter  . 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 Allergy     seasonal  . GERD (gastroesophageal reflux disease)    Past Surgical History  Procedure Laterality Date  . Breast biopsy  03/2004    Negative  . Total abdominal hysterectomy  09/1996    Fibroids  . Breast lumpectomy  12/1977  . Breast cyst aspiration  05/2007    Bilateral-benign  . Ankle fracture surgery  05/2003   History  Substance Use Topics  . Smoking status: Never Smoker   . Smokeless tobacco: Never Used  . Alcohol Use: 0.0 oz/week    0 Standard drinks or equivalent per week     Comment: occasional use   Family History  Problem Relation Age of Onset  . Diabetes Father   . Hypertension Mother   . Breast cancer Daughter     (inflammatory)-now has brain tumor  . Diabetes Paternal Grandmother   . Breast cancer      great aunt  . Colon cancer Neg Hx   . Rectal cancer Neg Hx   . Stomach cancer Neg Hx   . Esophageal cancer Neg Hx    Allergies  Allergen Reactions  . Eggs Or Egg-Derived Products Other (See Comments)    REACTION: SEVERE REACTION PV 4-22 pt states she gets flu like symptoms all but fever, no anaphylasis  . Hepatitis A Vaccine Other (See Comments)    ? Allergic reaction; itching,weakness almost immediate after taking 1st vaccine   Current Outpatient Prescriptions on File Prior to Visit  Medication Sig Dispense Refill  . busPIRone (BUSPAR) 15 MG tablet Take 0.5 tablets (7.5 mg total) by mouth 2 (two) times daily  as needed. 30 tablet 1  . cetirizine (ZYRTEC) 10 MG tablet Take 10 mg by mouth daily as needed.      . mometasone (NASONEX) 50 MCG/ACT nasal spray  Place 2 sprays into the nose daily. 2 sprays in each nostril per day as needed 17 g 3  . Multiple Vitamin (MULTIVITAMIN) tablet Take 1 tablet by mouth daily.      Marland Kitchen nystatin cream (MYCOSTATIN) Apply topically as directed. Nystatin cream 100,000 units per gm apply to affected area once daily as directed. 30 g 1   No current facility-administered medications on file prior to visit.      Review of Systems Review of Systems  Constitutional: Negative for fever, appetite change, fatigue and unexpected weight change.  Eyes: Negative for pain and visual disturbance.  Respiratory: Negative for cough and shortness of breath.   Cardiovascular: Negative for cp or palpitations    Gastrointestinal: Negative for nausea, diarrhea and constipation.  Genitourinary: Negative for urgency and frequency.  Skin: Negative for pallor or rash   Neurological: Negative for weakness, light-headedness, numbness and headaches.  Hematological: Negative for adenopathy. Does not bruise/bleed easily.  Psychiatric/Behavioral: Negative for dysphoric mood. The patient is not nervous/anxious.         Objective:   Physical Exam  Constitutional: She appears well-developed and well-nourished. No distress.  overwt and well appearing   HENT:  Head: Normocephalic and atraumatic.  Right Ear: External ear normal.  Left Ear: External ear normal.  Mouth/Throat: Oropharynx is clear and moist.  Eyes: Conjunctivae and EOM are normal. Pupils are equal, round, and reactive to light. No scleral icterus.  Neck: Normal range of motion. Neck supple. No JVD present. Carotid bruit is not present. No thyromegaly present.  Cardiovascular: Normal rate, regular rhythm, normal heart sounds and intact distal pulses.  Exam reveals no gallop.   Pulmonary/Chest: Effort normal and breath sounds normal. No respiratory distress. She has no wheezes. She exhibits no tenderness.  Abdominal: Soft. Bowel sounds are normal. She exhibits no distension, no  abdominal bruit and no mass. There is no tenderness.  Genitourinary: No breast swelling, tenderness, discharge or bleeding.  Breast exam: No mass, nodules, thickening, tenderness, bulging, retraction, inflamation, nipple discharge or skin changes noted.  No axillary or clavicular LA.      Musculoskeletal: Normal range of motion. She exhibits no edema or tenderness.  Lymphadenopathy:    She has no cervical adenopathy.  Neurological: She is alert. She has normal reflexes. No cranial nerve deficit. She exhibits normal muscle tone. Coordination normal.  Skin: Skin is warm and dry. No rash noted. No erythema. No pallor.  Hyperpigmented skin under R arm /slt scale  Psychiatric: She has a normal mood and affect.          Assessment & Plan:   Problem List Items Addressed This Visit    Hyperglycemia - Primary    Improved with better diet Lab Results  Component Value Date   HGBA1C 5.9 09/05/2014   Enc this and wt loss and to continue exercise       Hyperlipidemia    Disc goals for lipids and reasons to control them Rev labs with pt Rev low sat fat diet in detail HDL is up  Enc exercise and low fat diet       Routine general medical examination at a health care facility    Reviewed health habits including diet and exercise and skin cancer prevention Reviewed appropriate screening tests for age  Also reviewed  health mt list, fam hx and immunization status , as well as social and family history   See HPI Labs reviewed in detail  Pt has mammogram planned  Enc further exercise and healthy diet

## 2014-09-08 NOTE — Patient Instructions (Addendum)
Get a dermatology appointment for the area under your arm - let me know if you need a referral   Take care of yourself  Blood sugar is better ! Keep up the good work  Other labs are stable  Keep exercising  Get your mammogram as planned

## 2014-09-08 NOTE — Assessment & Plan Note (Signed)
Improved with better diet Lab Results  Component Value Date   HGBA1C 5.9 09/05/2014   Enc this and wt loss and to continue exercise

## 2014-09-08 NOTE — Assessment & Plan Note (Signed)
Disc goals for lipids and reasons to control them Rev labs with pt Rev low sat fat diet in detail HDL is up  Enc exercise and low fat diet

## 2014-09-11 ENCOUNTER — Other Ambulatory Visit: Payer: 59

## 2014-09-11 ENCOUNTER — Encounter: Payer: 59 | Admitting: Family Medicine

## 2014-09-12 ENCOUNTER — Other Ambulatory Visit: Payer: 59

## 2014-09-15 ENCOUNTER — Encounter: Payer: 59 | Admitting: Family Medicine

## 2014-09-29 ENCOUNTER — Other Ambulatory Visit: Payer: 59

## 2014-10-04 ENCOUNTER — Encounter: Payer: 59 | Admitting: Family Medicine

## 2014-10-16 ENCOUNTER — Ambulatory Visit: Payer: 59

## 2014-11-09 ENCOUNTER — Ambulatory Visit: Payer: 59

## 2014-11-14 ENCOUNTER — Telehealth: Payer: Self-pay | Admitting: Family Medicine

## 2014-11-14 DIAGNOSIS — Z1321 Encounter for screening for nutritional disorder: Secondary | ICD-10-CM | POA: Insufficient documentation

## 2014-11-14 NOTE — Telephone Encounter (Signed)
-----   Message from Trula Slade sent at 11/10/2014  4:01 PM EDT ----- Regarding: Lab Order Contact: [phone] Dr. Milinda Antis:  I would like to know the levels on my Vitamin D.  I was told it's not on labs for physicals.  So I will need an order to draw the blood.  Please send an order for this at your earliest convenience.  Thank you,  Carolyn Matthews

## 2014-11-14 NOTE — Telephone Encounter (Signed)
I ordered the lab. 

## 2014-11-15 ENCOUNTER — Other Ambulatory Visit (INDEPENDENT_AMBULATORY_CARE_PROVIDER_SITE_OTHER): Payer: 59

## 2014-11-15 DIAGNOSIS — Z1321 Encounter for screening for nutritional disorder: Secondary | ICD-10-CM

## 2014-11-15 LAB — VITAMIN D 25 HYDROXY (VIT D DEFICIENCY, FRACTURES): VITD: 25.28 ng/mL — ABNORMAL LOW (ref 30.00–100.00)

## 2014-11-15 NOTE — Telephone Encounter (Signed)
Sent pt a message letting her know lab is ordered

## 2014-11-16 ENCOUNTER — Encounter: Payer: Self-pay | Admitting: Family Medicine

## 2014-12-12 ENCOUNTER — Ambulatory Visit (INDEPENDENT_AMBULATORY_CARE_PROVIDER_SITE_OTHER): Payer: 59 | Admitting: Family Medicine

## 2014-12-12 ENCOUNTER — Encounter: Payer: Self-pay | Admitting: Family Medicine

## 2014-12-12 ENCOUNTER — Ambulatory Visit (INDEPENDENT_AMBULATORY_CARE_PROVIDER_SITE_OTHER)
Admission: RE | Admit: 2014-12-12 | Discharge: 2014-12-12 | Disposition: A | Discharge status: Home / Self Care | Payer: 59 | Source: Ambulatory Visit | Attending: Family Medicine | Admitting: Family Medicine

## 2014-12-12 VITALS — BP 112/68 | HR 65 | Temp 97.4°F | Ht 64.0 in | Wt 165.2 lb

## 2014-12-12 DIAGNOSIS — R079 Chest pain, unspecified: Secondary | ICD-10-CM | POA: Diagnosis not present

## 2014-12-12 DIAGNOSIS — F419 Anxiety disorder, unspecified: Secondary | ICD-10-CM

## 2014-12-12 DIAGNOSIS — R12 Heartburn: Secondary | ICD-10-CM

## 2014-12-12 DIAGNOSIS — R0782 Intercostal pain: Secondary | ICD-10-CM | POA: Diagnosis not present

## 2014-12-12 MED ORDER — BUSPIRONE HCL 15 MG PO TABS: 15.0000 mg | ORAL_TABLET | Freq: Two times a day (BID) | ORAL | Status: DC

## 2014-12-12 NOTE — Progress Notes (Signed)
Subjective:    Patient ID: Carolyn Matthews, female    DOB: [birth year], 59 y.o.   MRN: [id number]  HPI Here with chest discomfort   Wt is up 8 lb  BP Readings from Last 3 Encounters:  12/12/14 112/68  09/08/14 130/68  08/18/14 148/80   bmi is 28  EKG NSR with rate of 60 and no acute changes  Started abruptly yesterday around 5:30 (took 1/2 buspar and it helped a little-took another 1/2 and it did help)  She was on her way home and realized she left her phone at work - began to panic  Mid chest-sharp -made her grab it  Relief to press on it  Not sob but hurt to take a full breath No nausea or other symptoms  Not exertional  It lasted until pm- was able to go to sleep  Still has it -dull and less severe   Is having heartburn - went to eat something - felt like a wad of food that won't move No burping  No diarrhea/constipation or blood in stool   Stress level is high right now  buspar helps anxiety symptoms quite a bit    Patient Active Problem List   Diagnosis Date Noted  . Heartburn 12/14/2014  . Chest pain 12/12/2014  . Encounter for vitamin deficiency screening 11/14/2014  . Cervical lymphadenitis 05/26/2014  . Thoracic back pain 01/13/2014  . Breast pain 07/01/2013  . Colon cancer screening 08/11/2012  . 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 Anxiety disorder 02/26/2011  . 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 Occult blood in stools 09/14/2010  . Other screening mammogram 08/28/2010  . Elevated blood protein 08/28/2010  . Dermatitis 06/24/2010  . Routine general medical examination at a health care facility 05/23/2010  . MVP (mitral valve prolapse)   . Chronic headaches   . Seasonal allergies   . Family history of breast cancer in female   . Hyperglycemia 04/04/2009  . Hyperlipidemia 11/22/2008  . Anemia, unspecified 02/12/2007  . OVERACTIVE BLADDER 01/11/2007  . FIBROCYSTIC BREAST DISEASE 01/11/2007   Past Medical History  Diagnosis Date  .  MVP (mitral valve prolapse)   . Anemia   . Chronic headaches   . Seasonal allergies   . Fibrocystic breast disease   . Hyperglycemia     Diet controlled  . Family history of breast cancer in female     Daughter  . 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 Allergy     seasonal  . GERD (gastroesophageal reflux disease)    Past Surgical History  Procedure Laterality Date  . Breast biopsy  03/2004    Negative  . Total abdominal hysterectomy  09/1996    Fibroids  . Breast lumpectomy  12/1977  . Breast cyst aspiration  05/2007    Bilateral-benign  . Ankle fracture surgery  05/2003   Social History  Substance Use Topics  . Smoking status: Never Smoker   . Smokeless tobacco: Never Used  . Alcohol Use: 0.0 oz/week    0 Standard drinks or equivalent per week     Comment: occasional use   Family History  Problem Relation Age of Onset  . Diabetes Father   . Hypertension Mother   . Breast cancer Daughter     (inflammatory)-now has brain tumor  . Diabetes Paternal Grandmother   . Breast cancer      great aunt  . Colon cancer Neg Hx   . Rectal cancer Neg Hx   .  Stomach cancer Neg Hx   . Esophageal cancer Neg Hx   . Breast cancer Daughter     2nd daughter with breast cancer    Allergies  Allergen Reactions  . Eggs Or Egg-Derived Products Other (See Comments)    REACTION: SEVERE REACTION PV 4-22 pt states she gets flu like symptoms all but fever, no anaphylasis  . Hepatitis A Vaccine Other (See Comments)    ? Allergic reaction; itching,weakness almost immediate after taking 1st vaccine   Current Outpatient Prescriptions on File Prior to Visit  Medication Sig Dispense Refill  . cetirizine (ZYRTEC) 10 MG tablet Take 10 mg by mouth daily as needed.      . mometasone (NASONEX) 50 MCG/ACT nasal spray Place 2 sprays into the nose daily. 2 sprays in each nostril per day as needed 17 g 3  . Multiple Vitamin (MULTIVITAMIN) tablet Take 1 tablet by mouth daily.      Marland Kitchen nystatin cream  (MYCOSTATIN) Apply topically as directed. Nystatin cream 100,000 units per gm apply to affected area once daily as directed. 30 g 1   No current facility-administered medications on file prior to visit.    Review of Systems Review of Systems  Constitutional: Negative for fever, appetite change,  and unexpected weight change.  Eyes: Negative for pain and visual disturbance.  Respiratory: Negative for cough and shortness of breath.   Cardiovascular: Negative for  palpitations   pos for cp that is resolved  Gastrointestinal: Negative for nausea, diarrhea and constipation. pos for frequent heartburn and burping  Genitourinary: Negative for urgency and frequency.  Skin: Negative for pallor or rash   Neurological: Negative for weakness, light-headedness, numbness and headaches.  Hematological: Negative for adenopathy. Does not bruise/bleed easily.  Psychiatric/Behavioral: Negative for dysphoric mood. The patient is nervous/anxious.  pos for stressors       Objective:   Physical Exam  Constitutional: She appears well-developed and well-nourished. No distress.  overwt and well appearing   HENT:  Head: Normocephalic and atraumatic.  Mouth/Throat: Oropharynx is clear and moist.  Eyes: Conjunctivae and EOM are normal. Pupils are equal, round, and reactive to light.  Neck: Normal range of motion. Neck supple. No JVD present. Carotid bruit is not present. No thyromegaly present.  Cardiovascular: Normal rate, regular rhythm, normal heart sounds and intact distal pulses.  Exam reveals no gallop.   Pulmonary/Chest: Effort normal and breath sounds normal. No respiratory distress. She has no wheezes. She has no rales. She exhibits tenderness.  No crackles  Some anterior chest wall tenderness without crepitus or skin change   Abdominal: Soft. Bowel sounds are normal. She exhibits no distension, no abdominal bruit and no mass. There is no tenderness.  Musculoskeletal: She exhibits no edema.    Lymphadenopathy:    She has no cervical adenopathy.  Neurological: She is alert. She has normal reflexes.  Skin: Skin is warm and dry. No rash noted.  Psychiatric: She has a normal mood and affect.  Pt is not overly anxious today Talks candidly about stressors           Assessment & Plan:   Problem List Items Addressed This Visit      Other   Anxiety disorder    With some episodes of panic symptoms and chest pain  Inc buspar to 15 mg bid - (Discussed expectations of SSRI medication including time to effectiveness and mechanism of action, also poss of side effects (early and late)- including mental fuzziness, weight or appetite change, nausea  and poss of worse dep or anxiety (even suicidal thoughts)  Pt voiced understanding and will stop med and update if this occurs ) Reviewed stressors/ coping techniques/symptoms/ support sources/ tx options and side effects in detail today  Overall doing well given enormous stressors   Close f/u  Offered counseling       Chest pain - Primary    Atypical-nl EKG and cxr ordered  Also suspect multifactorial with GERD symptoms as well as anxiety and panic attack Also some CW tenderness -poss costochondritis Will tx with zantac prn  Also inc buspar to 15 mg bid  Reviewed stressors/ coping techniques/symptoms/ support sources/ tx options and side effects in detail today   Update if not starting to improve in a week or if worsening   Disc s/s of angina to watch for       Relevant Orders   DG Chest 2 View (Completed)   EKG 12-Lead (Completed)   DG Chest 2 View (Completed)   Heartburn    In conj with anx and chest pain  Some dyspepsia  Will tx with ranitidine 150 bid  Disc diet and foods to avoid  Close f/u

## 2014-12-12 NOTE — Patient Instructions (Signed)
Chest xray now  EKG is reassuring Try heat on your chest  Also zantac otc 75 mg twice daily for heartburn Increase buspar to 1 pill twice daily  (if you want counseling in the future let me know )  Update if not starting to improve in a week or if worsening

## 2014-12-12 NOTE — Progress Notes (Signed)
Pre visit review using our clinic review tool, if applicable. No additional management support is needed unless otherwise documented below in the visit note. 

## 2014-12-13 ENCOUNTER — Ambulatory Visit: Payer: 59

## 2014-12-13 ENCOUNTER — Ambulatory Visit
Admission: RE | Admit: 2014-12-13 | Discharge: 2014-12-13 | Disposition: A | Discharge status: Home / Self Care | Payer: 59 | Source: Ambulatory Visit

## 2014-12-13 DIAGNOSIS — Z1231 Encounter for screening mammogram for malignant neoplasm of breast: Secondary | ICD-10-CM

## 2014-12-14 DIAGNOSIS — R12 Heartburn: Secondary | ICD-10-CM | POA: Insufficient documentation

## 2014-12-14 NOTE — Assessment & Plan Note (Signed)
With some episodes of panic symptoms and chest pain  Inc buspar to 15 mg bid - (Discussed expectations of SSRI medication including time to effectiveness and mechanism of action, also poss of side effects (early and late)- including mental fuzziness, weight or appetite change, nausea and poss of worse dep or anxiety (even suicidal thoughts)  Pt voiced understanding and will stop med and update if this occurs ) Reviewed stressors/ coping techniques/symptoms/ support sources/ tx options and side effects in detail today  Overall doing well given enormous stressors   Close f/u  Offered counseling

## 2014-12-14 NOTE — Assessment & Plan Note (Signed)
In conj with anx and chest pain  Some dyspepsia  Will tx with ranitidine 150 bid  Disc diet and foods to avoid  Close f/u

## 2014-12-14 NOTE — Assessment & Plan Note (Addendum)
Atypical-nl EKG and cxr ordered  Also suspect multifactorial with GERD symptoms as well as anxiety and panic attack Also some CW tenderness -poss costochondritis Will tx with zantac prn  Also inc buspar to 15 mg bid  Reviewed stressors/ coping techniques/symptoms/ support sources/ tx options and side effects in detail today   Update if not starting to improve in a week or if worsening   Disc s/s of angina to watch for

## 2014-12-15 ENCOUNTER — Other Ambulatory Visit: Payer: Self-pay | Admitting: Family Medicine

## 2014-12-15 DIAGNOSIS — R928 Other abnormal and inconclusive findings on diagnostic imaging of breast: Secondary | ICD-10-CM

## 2014-12-25 ENCOUNTER — Telehealth: Payer: Self-pay | Admitting: Family Medicine

## 2014-12-25 ENCOUNTER — Ambulatory Visit
Admission: RE | Admit: 2014-12-25 | Discharge: 2014-12-25 | Disposition: A | Discharge status: Home / Self Care | Payer: 59 | Source: Ambulatory Visit | Attending: Family Medicine | Admitting: Family Medicine

## 2014-12-25 ENCOUNTER — Other Ambulatory Visit: Payer: Self-pay | Admitting: Family Medicine

## 2014-12-25 DIAGNOSIS — R928 Other abnormal and inconclusive findings on diagnostic imaging of breast: Secondary | ICD-10-CM

## 2014-12-25 NOTE — Telephone Encounter (Signed)
Done!  Good luck with everything

## 2014-12-25 NOTE — Telephone Encounter (Signed)
[facility] and said patient will needs a Biopsy done on 12/27/14 at 8 am.  They are asking for Dr.Tower to sign for the biopsy in Epic.

## 2014-12-26 ENCOUNTER — Other Ambulatory Visit: Payer: Self-pay | Admitting: Family Medicine

## 2014-12-26 ENCOUNTER — Telehealth: Payer: Self-pay

## 2014-12-26 DIAGNOSIS — R928 Other abnormal and inconclusive findings on diagnostic imaging of breast: Secondary | ICD-10-CM

## 2014-12-26 MED ORDER — ALPRAZOLAM 0.5 MG PO TABS: ORAL_TABLET | ORAL | Status: DC

## 2014-12-26 NOTE — Telephone Encounter (Signed)
Pt left v/m; pt having breast biopsy on 12/27/14 and request med to [facility] to help calm pts nerves before the procedure on 12/27/14.Please advise.

## 2014-12-26 NOTE — Telephone Encounter (Signed)
I sent Bonita QuinLinda the a message letting her know Rx sent and advise her of Dr. Royden Purlower's comments. Rx called in as prescribed

## 2014-12-26 NOTE — Telephone Encounter (Signed)
As long as she is not driving herself - xanax is fine  (careful of sedation) Please call it in  I hope all goes well !

## 2014-12-27 ENCOUNTER — Ambulatory Visit
Admission: RE | Admit: 2014-12-27 | Discharge: 2014-12-27 | Disposition: A | Discharge status: Home / Self Care | Payer: 59 | Source: Ambulatory Visit | Attending: Family Medicine | Admitting: Family Medicine

## 2014-12-27 DIAGNOSIS — R928 Other abnormal and inconclusive findings on diagnostic imaging of breast: Secondary | ICD-10-CM

## 2014-12-27 HISTORY — PX: BREAST BIOPSY: SHX20

## 2015-05-01 ENCOUNTER — Ambulatory Visit (INDEPENDENT_AMBULATORY_CARE_PROVIDER_SITE_OTHER): Payer: 59 | Admitting: Internal Medicine

## 2015-05-01 ENCOUNTER — Encounter: Payer: Self-pay | Admitting: Internal Medicine

## 2015-05-01 VITALS — BP 140/90 | HR 90 | Temp 97.4°F | Resp 20

## 2015-05-01 DIAGNOSIS — H6503 Acute serous otitis media, bilateral: Secondary | ICD-10-CM | POA: Diagnosis not present

## 2015-05-01 MED ORDER — HYDROCODONE-HOMATROPINE 5-1.5 MG/5ML PO SYRP: 5.0000 mL | ORAL_SOLUTION | Freq: Three times a day (TID) | ORAL | Status: DC | PRN

## 2015-05-01 MED ORDER — AMOXICILLIN-POT CLAVULANATE 875-125 MG PO TABS: 1.0000 | ORAL_TABLET | Freq: Two times a day (BID) | ORAL | Status: DC

## 2015-05-01 MED ORDER — FLUCONAZOLE 150 MG PO TABS: 150.0000 mg | ORAL_TABLET | Freq: Once | ORAL | Status: DC

## 2015-05-01 MED FILL — HYDROCODONE-HOMATROPINE SYR: 5-1.5 | 8 days supply | Qty: 120 | Fill #0

## 2015-05-01 MED FILL — AMOX TR-K CLV 875-125 MG TA: 875-125 | 10 days supply | Qty: 20 | Fill #0

## 2015-05-01 MED FILL — FLUCONAZOLE 150 MG TABLET: 150 | 1 days supply | Qty: 1 | Fill #0 | Status: TO

## 2015-05-01 NOTE — Progress Notes (Signed)
   Subjective:    Patient ID: Carolyn GableLinda Fay Matthews, female    DOB: [birth year], 60 y.o.   MRN: [id number]  HPI 60 year old Black Female with onset March 26 of sore throat and URI symptoms. Has had cough. No fever or shaking chills. Unable to take flu vaccine due to egg allergy. No myalgias. Some ear discomfort.    Review of Systems     Objective:   Physical Exam  TMs are slightly full bilaterally but not red. Pharynx noninjected. Neck is supple. Chest clear to auscultation without rales or wheezing. Skin warm and dry.      Assessment & Plan:  Acute bilateral serous otitis media  Pharyngitis  Elevated blood pressure  Plan: Augmentin 875 mg twice daily for 10 days. Diflucan 150 mg tablet with one refill to take should Candida vaginitis symptoms appear while on antibiotics. Hycodan 1 teaspoon by mouth every 8 hours when necessary cough and sore throat pain.  She should have her blood pressure checked 2-3 times weekly over the next 3 weeks at work.

## 2015-05-01 NOTE — Patient Instructions (Signed)
Take medications as prescribed. Check blood pressure 2-3 times weekly over the next 3 weeks.

## 2015-08-14 ENCOUNTER — Telehealth: Payer: Self-pay

## 2015-08-14 ENCOUNTER — Telehealth: Payer: Self-pay | Admitting: Family Medicine

## 2015-08-14 MED ORDER — KETOCONAZOLE 2 % EX CREA: 1.0000 "application " | TOPICAL_CREAM | Freq: Every day | CUTANEOUS | Status: DC | PRN

## 2015-08-14 NOTE — Telephone Encounter (Signed)
In regards to rash-will try ketoconazole cream inst her to keep derm appt Pt is aware - I spoke to her

## 2015-08-14 NOTE — Telephone Encounter (Signed)
Itchy or not itchy? Painful?

## 2015-08-14 NOTE — Telephone Encounter (Signed)
Rash under rt arm pit is spreading and itching; nystatin cream is not helping; could not get appt with dermatologist for 2 months. Pt request med to [facility]. Pt request cb/

## 2015-08-14 NOTE — Telephone Encounter (Signed)
Dr. Milinda Antisower spoke directly to Dr. Milinda Antisower about rash

## 2015-08-30 MED FILL — FLUTICASONE PROP 50 MCG SPR: 50 | 30 days supply | Qty: 16 | Fill #0 | Status: TO

## 2015-08-30 MED FILL — SUDOGEST 12 HOUR 120 MG CAP: 120 | 30 days supply | Qty: 60 | Fill #0

## 2015-09-11 ENCOUNTER — Other Ambulatory Visit: Payer: 59

## 2015-09-18 ENCOUNTER — Encounter: Payer: 59 | Admitting: Family Medicine

## 2015-09-19 DIAGNOSIS — L280 Lichen simplex chronicus: Secondary | ICD-10-CM | POA: Diagnosis not present

## 2015-09-19 MED FILL — FLUOCINONIDE 0.05% CREAM: 0.05 | 20 days supply | Qty: 60 | Fill #0

## 2015-09-29 ENCOUNTER — Telehealth: Payer: Self-pay | Admitting: Family Medicine

## 2015-09-29 DIAGNOSIS — R739 Hyperglycemia, unspecified: Secondary | ICD-10-CM

## 2015-09-29 DIAGNOSIS — Z0000 Encounter for general adult medical examination without abnormal findings: Secondary | ICD-10-CM

## 2015-09-29 NOTE — Telephone Encounter (Signed)
-----   Message from Baldomero LamyNatasha C Chavers sent at 09/26/2015 10:25 AM EDT ----- Regarding: Cpx labs Mon 8/28, need orders. Thanks! :-) Please order  future cpx labs for pt's upcoming lab appt. Thanks Rodney Boozeasha

## 2015-10-01 ENCOUNTER — Other Ambulatory Visit (INDEPENDENT_AMBULATORY_CARE_PROVIDER_SITE_OTHER): Payer: 59

## 2015-10-01 DIAGNOSIS — Z0000 Encounter for general adult medical examination without abnormal findings: Secondary | ICD-10-CM | POA: Diagnosis not present

## 2015-10-01 DIAGNOSIS — R739 Hyperglycemia, unspecified: Secondary | ICD-10-CM | POA: Diagnosis not present

## 2015-10-01 LAB — COMPREHENSIVE METABOLIC PANEL
ALK PHOS: 78 U/L (ref 39–117)
ALT: 13 U/L (ref 0–35)
AST: 18 U/L (ref 0–37)
Albumin: 4.7 g/dL (ref 3.5–5.2)
BILIRUBIN TOTAL: 0.6 mg/dL (ref 0.2–1.2)
BUN: 12 mg/dL (ref 6–23)
CALCIUM: 9.6 mg/dL (ref 8.4–10.5)
CO2: 30 meq/L (ref 19–32)
CREATININE: 0.78 mg/dL (ref 0.40–1.20)
Chloride: 101 mEq/L (ref 96–112)
GFR: 97.31 mL/min (ref >60.00)
GLUCOSE: 101 mg/dL — AB (ref 70–99)
Potassium: 4.1 mEq/L (ref 3.5–5.1)
Sodium: 139 mEq/L (ref 135–145)
TOTAL PROTEIN: 8.1 g/dL (ref 6.0–8.3)

## 2015-10-01 LAB — CBC WITH DIFFERENTIAL/PLATELET
BASOS ABS: 0 10*3/uL (ref 0.0–0.1)
Basophils Relative: 0.3 % (ref 0.0–3.0)
EOS ABS: 0.1 10*3/uL (ref 0.0–0.7)
Eosinophils Relative: 1.5 % (ref 0.0–5.0)
HEMATOCRIT: 35.7 % — AB (ref 36.0–46.0)
HEMOGLOBIN: 11.7 g/dL — AB (ref 12.0–15.0)
LYMPHS PCT: 21.5 % (ref 12.0–46.0)
Lymphs Abs: 2.1 10*3/uL (ref 0.7–4.0)
MCHC: 32.8 g/dL (ref 30.0–36.0)
MCV: 91.4 fl (ref 78.0–100.0)
MONOS PCT: 4.2 % (ref 3.0–12.0)
Monocytes Absolute: 0.4 10*3/uL (ref 0.1–1.0)
NEUTROS ABS: 7.1 10*3/uL (ref 1.4–7.7)
Neutrophils Relative %: 72.5 % (ref 43.0–77.0)
PLATELETS: 372 10*3/uL (ref 150.0–400.0)
RBC: 3.91 Mil/uL (ref 3.87–5.11)
RDW: 13.7 % (ref 11.5–15.5)
WBC: 9.8 10*3/uL (ref 4.0–10.5)

## 2015-10-01 LAB — LIPID PANEL
CHOL/HDL RATIO: 4
CHOLESTEROL: 195 mg/dL (ref 0–200)
HDL: 49.2 mg/dL (ref >39.00)
LDL CALC: 130 mg/dL — AB (ref 0–99)
NonHDL: 145.79
Triglycerides: 80 mg/dL (ref 0.0–149.0)
VLDL: 16 mg/dL (ref 0.0–40.0)

## 2015-10-01 LAB — TSH: TSH: 1.37 u[IU]/mL (ref 0.35–4.50)

## 2015-10-01 LAB — HEMOGLOBIN A1C: Hgb A1c MFr Bld: 6 % (ref 4.6–6.5)

## 2015-10-02 ENCOUNTER — Other Ambulatory Visit (INDEPENDENT_AMBULATORY_CARE_PROVIDER_SITE_OTHER): Payer: 59

## 2015-10-02 DIAGNOSIS — E559 Vitamin D deficiency, unspecified: Secondary | ICD-10-CM | POA: Diagnosis not present

## 2015-10-02 LAB — VITAMIN D 25 HYDROXY (VIT D DEFICIENCY, FRACTURES): VITD: 37.79 ng/mL (ref 30.00–100.00)

## 2015-10-03 ENCOUNTER — Ambulatory Visit (INDEPENDENT_AMBULATORY_CARE_PROVIDER_SITE_OTHER): Payer: 59 | Admitting: Family Medicine

## 2015-10-03 ENCOUNTER — Encounter: Payer: Self-pay | Admitting: Family Medicine

## 2015-10-03 VITALS — BP 130/66 | HR 93 | Temp 98.2°F | Ht 63.5 in | Wt 164.5 lb

## 2015-10-03 DIAGNOSIS — Z0000 Encounter for general adult medical examination without abnormal findings: Secondary | ICD-10-CM | POA: Diagnosis not present

## 2015-10-03 DIAGNOSIS — E785 Hyperlipidemia, unspecified: Secondary | ICD-10-CM | POA: Diagnosis not present

## 2015-10-03 DIAGNOSIS — E559 Vitamin D deficiency, unspecified: Secondary | ICD-10-CM | POA: Diagnosis not present

## 2015-10-03 DIAGNOSIS — R739 Hyperglycemia, unspecified: Secondary | ICD-10-CM

## 2015-10-03 NOTE — Patient Instructions (Addendum)
You are due for a tetanus shot in Jan  You will be due for a mammogram in Nov - when you get the reminder if it is a diagnostic mammogram just let us know and I will do the referral  Keep watching carbs  For cholesterol    Avoid red meat/ fried foods/ egg yolks/ fatty breakfast meats/ butter, cheese and high fat dairy/ and shellfish   Take care of yourself

## 2015-10-03 NOTE — Progress Notes (Signed)
Subjective:    Patient ID: Carolyn Matthews, female    DOB: 07-Jun-1955, 60 y.o.   MRN: [id number]  HPI  Here for health maintenance exam and to review chronic medical problems    Is feeling ok overall   Rash is better - lidex cream is really helping - turned out it was not yeast   Wt Readings from Last 3 Encounters:  10/03/15 164 lb 8 oz (74.6 kg)  12/12/14 165 lb 4 oz (75 kg)  09/08/14 157 lb 8 oz (71.4 kg)  getting some exercise - walking 2 miles three times per week - although a busy few months  bmi is 26.6  Pap 11/05 No hx of abn paps No gyn symptoms  Has had a hysterectomy   Tetanus shot 1/08- she will wait until Jan   Is exempt from flu shot - due to a severe reaction to the hep A vaccine   Mammogram 11/16 Self breast exam- no lumps  Strong family hx of breast cancer   Colonoscopy 5/16-10 year recall   Vit D level is 37.7 improved  She is taking vitamin D - and also was outdoors more this summer   Hx of hyperglycemia Lab Results  Component Value Date   HGBA1C 6.0 10/01/2015   This is stable and well controlled with diet and exercise    Hx of hyperlipidemia Lab Results  Component Value Date   CHOL 195 10/01/2015   CHOL 195 09/05/2014   CHOL 174 08/30/2013   Lab Results  Component Value Date   HDL 49.20 10/01/2015   HDL 55.00 09/05/2014   HDL 45.10 08/30/2013   Lab Results  Component Value Date   LDLCALC 130 (H) 10/01/2015   LDLCALC 127 (H) 09/05/2014   LDLCALC 111 (H) 08/30/2013   Lab Results  Component Value Date   TRIG 80.0 10/01/2015   TRIG 65.0 09/05/2014   TRIG 91.0 08/30/2013   Lab Results  Component Value Date   CHOLHDL 4 10/01/2015   CHOLHDL 4 09/05/2014   CHOLHDL 4 08/30/2013   No results found for: LDLDIRECT  Diet- eating more fatty foods like red meat and sausage  Some fried foods   Lab Results  Component Value Date   WBC 9.8 10/01/2015   HGB 11.7 (L) 10/01/2015   HCT 35.7 (L) 10/01/2015   MCV 91.4 10/01/2015   PLT 372.0 10/01/2015   stable with no change     Chemistry      Component Value Date/Time   NA 139 10/01/2015 1528   K 4.1 10/01/2015 1528   CL 101 10/01/2015 1528   CO2 30 10/01/2015 1528   BUN 12 10/01/2015 1528   CREATININE 0.78 10/01/2015 1528      Component Value Date/Time   CALCIUM 9.6 10/01/2015 1528   ALKPHOS 78 10/01/2015 1528   AST 18 10/01/2015 1528   ALT 13 10/01/2015 1528   BILITOT 0.6 10/01/2015 1528      Lab Results  Component Value Date   TSH 1.37 10/01/2015    Patient Active Problem List   Diagnosis Date Noted  . Vitamin D deficiency 10/03/2015  . Heartburn 12/14/2014  . Chest pain 12/12/2014  . Encounter for vitamin deficiency screening 11/14/2014  . Cervical lymphadenitis 05/26/2014  . Thoracic back pain 01/13/2014  . Colon cancer screening 08/11/2012  . 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 Anxiety disorder 02/26/2011  . 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 Other screening mammogram 08/28/2010  . Routine general medical  examination at a health care facility 05/23/2010  . MVP (mitral valve prolapse)   . Chronic headaches   . Seasonal allergies   . Family history of breast cancer in female   . Hyperglycemia 04/04/2009  . Hyperlipidemia 11/22/2008  . Anemia, unspecified 02/12/2007  . OVERACTIVE BLADDER 01/11/2007  . FIBROCYSTIC BREAST DISEASE 01/11/2007   Past Medical History:  Diagnosis Date  . Allergy    seasonal  . Anemia   . Chronic headaches   . Family history of breast cancer in female    Daughter  . Fibrocystic breast disease   . GERD (gastroesophageal reflux disease)   . Hyperglycemia    Diet controlled  . MVP (mitral valve prolapse)   . Seasonal allergies   . 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 ANKLE FRACTURE SURGERY  05/2003  . BREAST BIOPSY  03/2004   Negative  . BREAST CYST ASPIRATION  05/2007   Bilateral-benign  . BREAST LUMPECTOMY  12/1977  . TOTAL  ABDOMINAL HYSTERECTOMY  09/1996   Fibroids   Social History  Substance Use Topics  . Smoking status: Never Smoker  . Smokeless tobacco: Never Used  . Alcohol use 0.0 oz/week     Comment: occasional use   Family History  Problem Relation Age of Onset  . Diabetes Father   . Hypertension Mother   . Breast cancer Daughter     (inflammatory)-now has brain tumor  . Diabetes Paternal Grandmother   . Breast cancer      great aunt  . Colon cancer Neg Hx   . Rectal cancer Neg Hx   . Stomach cancer Neg Hx   . Esophageal cancer Neg Hx   . Breast cancer Daughter     2nd daughter with breast cancer    Allergies  Allergen Reactions  . Eggs Or Egg-Derived Products Other (See Comments)    REACTION: SEVERE REACTION PV 4-22 pt states she gets flu like symptoms all but fever, no anaphylasis  . Hepatitis A Vaccine Other (See Comments)    ? Allergic reaction; itching,weakness almost immediate after taking 1st vaccine   Current Outpatient Prescriptions on File Prior to Visit  Medication Sig Dispense Refill  . busPIRone (BUSPAR) 15 MG tablet Take 1 tablet (15 mg total) by mouth 2 (two) times daily. (Patient taking differently: Take 15 mg by mouth 2 (two) times daily as needed. ) 60 tablet 11  . cetirizine (ZYRTEC) 10 MG tablet Take 10 mg by mouth daily as needed.      Marland Kitchen HYDROcodone-homatropine (HYCODAN) 5-1.5 MG/5ML syrup Take 5 mLs by mouth every 8 (eight) hours as needed for cough. 120 mL 0  . mometasone (NASONEX) 50 MCG/ACT nasal spray Place 2 sprays into the nose daily. 2 sprays in each nostril per day as needed 17 g 3  . Multiple Vitamin (MULTIVITAMIN) tablet Take 1 tablet by mouth daily.       No current facility-administered medications on file prior to visit.     Review of Systems Review of Systems  Constitutional: Negative for fever, appetite change, fatigue and unexpected weight change.  Eyes: Negative for pain and visual disturbance.  Respiratory: Negative for cough and shortness of  breath.   Cardiovascular: Negative for cp or palpitations    Gastrointestinal: Negative for nausea, diarrhea and constipation.  Genitourinary: Negative for urgency and frequency.  Skin: Negative for pallor or rash   Neurological: Negative for weakness, light-headedness, numbness and headaches.  Hematological: Negative for adenopathy. Does not bruise/bleed easily.  Psychiatric/Behavioral: Negative for dysphoric mood. The patient is not nervous/anxious.         Objective:   Physical Exam  Constitutional: She appears well-developed and well-nourished. No distress.  overwt and well appearing   HENT:  Head: Normocephalic and atraumatic.  Right Ear: External ear normal.  Left Ear: External ear normal.  Mouth/Throat: Oropharynx is clear and moist.  Eyes: Conjunctivae and EOM are normal. Pupils are equal, round, and reactive to light. No scleral icterus.  Neck: Normal range of motion. Neck supple. No JVD present. Carotid bruit is not present. No thyromegaly present.  Cardiovascular: Normal rate, regular rhythm, normal heart sounds and intact distal pulses.  Exam reveals no gallop.   No murmur heard today  Pulmonary/Chest: Effort normal and breath sounds normal. No respiratory distress. She has no wheezes. She exhibits no tenderness.  Abdominal: Soft. Bowel sounds are normal. She exhibits no distension, no abdominal bruit and no mass. There is no tenderness.  Genitourinary: No breast swelling, tenderness, discharge or bleeding.  Genitourinary Comments: Breast exam: No mass, nodules, thickening, tenderness, bulging, retraction, inflamation, nipple discharge or skin changes noted.  No axillary or clavicular LA.      Musculoskeletal: Normal range of motion. She exhibits no edema or tenderness.  Lymphadenopathy:    She has no cervical adenopathy.  Neurological: She is alert. She has normal reflexes. No cranial nerve deficit. She exhibits normal muscle tone. Coordination normal.  Skin: Skin is warm  and dry. No rash noted. No erythema. No pallor.  Prior rash near axillae are much improved   Psychiatric: She has a normal mood and affect.          Assessment & Plan:   Problem List Items Addressed This Visit      Other   Vitamin D deficiency    Improved this time with level of 37.7 Enc current supplementation for bone and overall health      Routine general medical examination at a health care facility    Reviewed health habits including diet and exercise and skin cancer prevention Reviewed appropriate screening tests for age  Also reviewed health mt list, fam hx and immunization status , as well as social and family history   Labs reviewed Last mammogram and breast bx reviewed-will be due for it again in NOV-enc self breast exams and nl exam today  Tetanus shot due in January  Exempt from flu vaccine to to severe rxn to hep A vaccine in the past  Enc her to get back to an exercise program      Hyperlipidemia    Disc goals for lipids and reasons to control them Rev labs with pt Rev low sat fat diet in detail To reduce LDL cholesterol-handout given/ will try to cut back on red meat and fried foods       Hyperglycemia - Primary    Lab Results  Component Value Date   HGBA1C 6.0 10/01/2015   Stable with diet/exercise Disc imp of wt control and also low glycemic diet to prevent DM2 Doing well overall        Other Visit Diagnoses   None.

## 2015-10-03 NOTE — Progress Notes (Signed)
Pre visit review using our clinic review tool, if applicable. No additional management support is needed unless otherwise documented below in the visit note. 

## 2015-10-03 NOTE — Assessment & Plan Note (Signed)
Disc goals for lipids and reasons to control them Rev labs with pt Rev low sat fat diet in detail To reduce LDL cholesterol-handout given/ will try to cut back on red meat and fried foods

## 2015-10-03 NOTE — Assessment & Plan Note (Signed)
Lab Results  Component Value Date   HGBA1C 6.0 10/01/2015   Stable with diet/exercise Disc imp of wt control and also low glycemic diet to prevent DM2 Doing well overall

## 2015-10-03 NOTE — Assessment & Plan Note (Signed)
Improved this time with level of 37.7 Enc current supplementation for bone and overall health

## 2015-10-03 NOTE — Assessment & Plan Note (Signed)
Reviewed health habits including diet and exercise and skin cancer prevention Reviewed appropriate screening tests for age  Also reviewed health mt list, fam hx and immunization status , as well as social and family history   Labs reviewed Last mammogram and breast bx reviewed-will be due for it again in NOV-enc self breast exams and nl exam today  Tetanus shot due in January  Exempt from flu vaccine to to severe rxn to hep A vaccine in the past  Enc her to get back to an exercise program

## 2015-11-26 ENCOUNTER — Other Ambulatory Visit: Payer: Self-pay | Admitting: Family Medicine

## 2015-11-26 DIAGNOSIS — Z1231 Encounter for screening mammogram for malignant neoplasm of breast: Secondary | ICD-10-CM

## 2015-12-03 ENCOUNTER — Telehealth: Payer: Self-pay | Admitting: Family Medicine

## 2015-12-03 MED ORDER — PSEUDOEPHEDRINE HCL ER 120 MG PO TB12: 120.0000 mg | ORAL_TABLET | Freq: Two times a day (BID) | ORAL | 1 refills | Status: DC | PRN

## 2015-12-03 NOTE — Telephone Encounter (Signed)
Pt needs refill of pseudogest (sudafed) sent to [facility]  She takes it prn for congestion/allergies It is otc but she thinks she can get it by px   Will refill electronically

## 2015-12-20 ENCOUNTER — Ambulatory Visit
Admission: RE | Admit: 2015-12-20 | Discharge: 2015-12-20 | Disposition: A | Discharge status: Home / Self Care | Payer: 59 | Source: Ambulatory Visit | Attending: Family Medicine | Admitting: Family Medicine

## 2015-12-20 DIAGNOSIS — Z1231 Encounter for screening mammogram for malignant neoplasm of breast: Secondary | ICD-10-CM | POA: Diagnosis not present

## 2016-01-07 ENCOUNTER — Encounter: Payer: Self-pay | Admitting: Family Medicine

## 2016-01-07 ENCOUNTER — Ambulatory Visit (INDEPENDENT_AMBULATORY_CARE_PROVIDER_SITE_OTHER): Payer: 59 | Admitting: Family Medicine

## 2016-01-07 VITALS — BP 144/76 | HR 68 | Temp 97.9°F | Ht 63.5 in | Wt 166.8 lb

## 2016-01-07 DIAGNOSIS — M545 Low back pain, unspecified: Secondary | ICD-10-CM | POA: Insufficient documentation

## 2016-01-07 LAB — POC URINALSYSI DIPSTICK (AUTOMATED)
BILIRUBIN UA: NEGATIVE
Blood, UA: 25
GLUCOSE UA: NEGATIVE
Ketones, UA: NEGATIVE
LEUKOCYTES UA: NEGATIVE
NITRITE UA: NEGATIVE
Spec Grav, UA: >=1.03
UROBILINOGEN UA: 0.2
pH, UA: 6

## 2016-01-07 MED ORDER — CYCLOBENZAPRINE HCL 10 MG PO TABS: 10.0000 mg | ORAL_TABLET | Freq: Every evening | ORAL | 0 refills | Status: DC | PRN

## 2016-01-07 NOTE — Progress Notes (Signed)
Pre visit review using our clinic review tool, if applicable. No additional management support is needed unless otherwise documented below in the visit note. 

## 2016-01-07 NOTE — Patient Instructions (Addendum)
Motrin (ibuprofen) 200 mg tablets- you can take 4 pills with a meal up to every 8 hours for pain and inflammation  Flexeril (muscle relaxer) at night as needed  Heat - 10 minutes at a time when you can  Start stretching also   Update if not starting to improve in a week or if worsening

## 2016-01-07 NOTE — Progress Notes (Signed)
Subjective:    Patient ID: Carolyn Matthews, female    DOB: 11-Jun-1955, 60 y.o.   MRN: [id number]  HPI Here for back pain   Started sat night - after being on her feet a lot  Low back and buttock area and it radiates forward to her abdomen  Constant - dull ache  Hurts to move and change position Years ago did something similar   No radiation to legs No numbness or weakness   No urinary symptoms or incontinence    Sat she took 3 motrin otc and that helped  Yesterday took 2- helped just a bit  Has not tried heat or ice   Results for orders placed or performed in visit on 01/07/16  POCT Urinalysis Dipstick (Automated)  Result Value Ref Range   Color, UA Yellow    Clarity, UA Clear    Glucose, UA Negative    Bilirubin, UA Negative    Ketones, UA Negative    Spec Grav, UA >=1.030    Blood, UA 25 Ery/uL    pH, UA 6.0    Protein, UA Trace    Urobilinogen, UA 0.2    Nitrite, UA Negative    Leukocytes, UA Negative Negative    Patient Active Problem List   Diagnosis Date Noted  . Low back pain 01/07/2016  . Vitamin D deficiency 10/03/2015  . Heartburn 12/14/2014  . Chest pain 12/12/2014  . Encounter for vitamin deficiency screening 11/14/2014  . Cervical lymphadenitis 05/26/2014  . Thoracic back pain 01/13/2014  . Colon cancer screening 08/11/2012  . 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 Anxiety disorder 02/26/2011  . 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 Other screening mammogram 08/28/2010  . Routine general medical examination at a health care facility 05/23/2010  . MVP (mitral valve prolapse)   . Chronic headaches   . Seasonal allergies   . Family history of breast cancer in female   . Hyperglycemia 04/04/2009  . Hyperlipidemia 11/22/2008  . Anemia, unspecified 02/12/2007  . OVERACTIVE BLADDER 01/11/2007  . FIBROCYSTIC BREAST DISEASE 01/11/2007   Past Medical History:  Diagnosis Date  . Allergy    seasonal  . Anemia   . Chronic headaches   .  Family history of breast cancer in female    Daughter  . Fibrocystic breast disease   . GERD (gastroesophageal reflux disease)   . Hyperglycemia    Diet controlled  . MVP (mitral valve prolapse)   . Seasonal allergies   . 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 ANKLE FRACTURE SURGERY  05/2003  . BREAST BIOPSY  03/2004   Negative  . BREAST CYST ASPIRATION  05/2007   Bilateral-benign  . BREAST LUMPECTOMY  12/1977  . TOTAL ABDOMINAL HYSTERECTOMY  09/1996   Fibroids   Social History  Substance Use Topics  . Smoking status: Never Smoker  . Smokeless tobacco: Never Used  . Alcohol use 0.0 oz/week     Comment: occasional use   Family History  Problem Relation Age of Onset  . Diabetes Father   . Hypertension Mother   . Breast cancer Daughter     (inflammatory)-now has brain tumor  . Diabetes Paternal Grandmother   . Breast cancer      great aunt  . Colon cancer Neg Hx   . Rectal cancer Neg Hx   . Stomach cancer Neg Hx   . Esophageal cancer Neg Hx   . Breast cancer  Daughter     2nd daughter with breast cancer    Allergies  Allergen Reactions  . Eggs Or Egg-Derived Products Other (See Comments)    REACTION: SEVERE REACTION PV 4-22 pt states she gets flu like symptoms all but fever, no anaphylasis  . Hepatitis A Vaccine Other (See Comments)    ? Allergic reaction; itching,weakness almost immediate after taking 1st vaccine   Current Outpatient Prescriptions on File Prior to Visit  Medication Sig Dispense Refill  . busPIRone (BUSPAR) 15 MG tablet Take 1 tablet (15 mg total) by mouth 2 (two) times daily. (Patient taking differently: Take 15 mg by mouth 2 (two) times daily as needed. ) 60 tablet 11  . cetirizine (ZYRTEC) 10 MG tablet Take 10 mg by mouth daily as needed.      . mometasone (NASONEX) 50 MCG/ACT nasal spray Place 2 sprays into the nose daily. 2 sprays in each nostril per day as needed 17 g 3  . Multiple Vitamin  (MULTIVITAMIN) tablet Take 1 tablet by mouth daily.      . pseudoephedrine (SUDAFED) 120 MG 12 hr tablet Take 1 tablet (120 mg total) by mouth every 12 (twelve) hours as needed for congestion. 180 tablet 1   No current facility-administered medications on file prior to visit.     Review of Systems Review of Systems  Constitutional: Negative for fever, appetite change, fatigue and unexpected weight change.  Eyes: Negative for pain and visual disturbance.  Respiratory: Negative for cough and shortness of breath.   Cardiovascular: Negative for cp or palpitations    Gastrointestinal: Negative for nausea, diarrhea and constipation. neg for abdominal or pelvic pain Genitourinary: Negative for urgency and frequency. neg for dysuria or hematuria  Skin: Negative for pallor or rash   MSK pos for bilat lumbar back pain/aching and stiffness w/o radiation Neurological: Negative for weakness, light-headedness, numbness and headaches.  Hematological: Negative for adenopathy. Does not bruise/bleed easily.  Psychiatric/Behavioral: Negative for dysphoric mood. The patient is not nervous/anxious.         Objective:   Physical Exam  Constitutional: She appears well-developed and well-nourished. No distress.  Well appearing    HENT:  Head: Normocephalic and atraumatic.  Eyes: Conjunctivae and EOM are normal. Pupils are equal, round, and reactive to light. No scleral icterus.  Neck: Normal range of motion. Neck supple.  Cardiovascular: Normal rate and regular rhythm.   Pulmonary/Chest: Effort normal and breath sounds normal. She has no wheezes. She has no rales.  Abdominal: Soft. Bowel sounds are normal. She exhibits no distension. There is no tenderness.  Musculoskeletal: She exhibits tenderness.       Right shoulder: She exhibits decreased range of motion, tenderness and spasm. She exhibits no bony tenderness, no swelling, no effusion, no crepitus and no deformity.       Lumbar back: She exhibits  decreased range of motion, tenderness and spasm. She exhibits no bony tenderness and no edema.  Tight and tender lumbar musculature bilaterally worse on the R  No spinous process tenderness No visible scoliosis or kyphosis  No skin changes  Neg SLR Nl rom hips and knees LS flex 40 deg with pain/ ext 10 deg and pain with L lateral bend  Nl twist Nl gait No neuro abn  Lymphadenopathy:    She has no cervical adenopathy.  Neurological: She is alert. She has normal strength and normal reflexes. She displays no atrophy. No cranial nerve deficit or sensory deficit. She exhibits normal muscle tone. Coordination normal.  Negative SLR  Skin: Skin is warm and dry. No rash noted. No erythema. No pallor.  Psychiatric: She has a normal mood and affect.          Assessment & Plan:   Problem List Items Addressed This Visit      Other   Low back pain - Primary    Bilateral low back - worse with movement and position change Re assuring exam  No neuro s/s Recommend nsaid prn  Flexeril sent in for spasm- pm use due to sedation Given handout with back stretches to start with -then exercises  Update if not starting to improve in a week or if worsening  (sooner if any neuro symptoms develop)        Relevant Medications   cyclobenzaprine (FLEXERIL) 10 MG tablet   Other Relevant Orders   POCT Urinalysis Dipstick (Automated) (Completed)

## 2016-01-07 NOTE — Assessment & Plan Note (Signed)
Bilateral low back - worse with movement and position change Re assuring exam  No neuro s/s Recommend nsaid prn  Flexeril sent in for spasm- pm use due to sedation Given handout with back stretches to start with -then exercises  Update if not starting to improve in a week or if worsening  (sooner if any neuro symptoms develop)

## 2016-03-03 MED FILL — FLUTICASONE PROP 50 MCG SPR: 50 | 30 days supply | Qty: 16 | Fill #0

## 2016-03-03 MED FILL — SM NASAL DECONGEST ER 120 M: 120 | 30 days supply | Qty: 60 | Fill #0

## 2016-03-17 ENCOUNTER — Telehealth: Payer: Self-pay | Admitting: Family Medicine

## 2016-03-17 MED ORDER — AMOXICILLIN-POT CLAVULANATE 875-125 MG PO TABS: 1.0000 | ORAL_TABLET | Freq: Two times a day (BID) | ORAL | 0 refills | Status: DC

## 2016-03-17 NOTE — Telephone Encounter (Signed)
Carolyn Matthews wanted Rx sent to pharmacy on file, done and Carolyn Matthews aware

## 2016-03-17 NOTE — Telephone Encounter (Signed)
Pt has symptoms of a sinus infection and ETD Please call in abx -not sure which pharmacy she wants

## 2016-03-25 DIAGNOSIS — J309 Allergic rhinitis, unspecified: Secondary | ICD-10-CM | POA: Diagnosis not present

## 2016-03-25 DIAGNOSIS — J019 Acute sinusitis, unspecified: Secondary | ICD-10-CM | POA: Diagnosis not present

## 2016-04-01 ENCOUNTER — Telehealth: Payer: Self-pay | Admitting: Family Medicine

## 2016-04-01 MED ORDER — FLUCONAZOLE 150 MG PO TABS: 150.0000 mg | ORAL_TABLET | Freq: Once | ORAL | 0 refills | Status: AC

## 2016-04-01 NOTE — Telephone Encounter (Signed)
Please let Carolyn Matthews know I sent in diflucan for her

## 2016-04-01 NOTE — Telephone Encounter (Signed)
Left voicemail letting pt know Rx was sent 

## 2016-04-09 ENCOUNTER — Other Ambulatory Visit: Payer: Self-pay | Admitting: *Deleted

## 2016-04-09 MED ORDER — FLUCONAZOLE 150 MG PO TABS: 150.0000 mg | ORAL_TABLET | Freq: Once | ORAL | 0 refills | Status: AC

## 2016-04-09 NOTE — Telephone Encounter (Signed)
Yeast inf sxs haven't resolved per Dr. Milinda Antisower okay to send in diflucan, [facility]

## 2016-05-06 MED FILL — SM NASAL DECONGEST ER 120 M: 120 | 30 days supply | Qty: 60 | Fill #1 | Status: TO

## 2016-06-20 NOTE — Progress Notes (Signed)
[facility] [address] [place], [place] [address] Phone: [phone] Subjective:    I'm seeing this patient by the request  of:  Tower, Audrie Gallus, MD  CC: Left knee pain  BJY:[id number]  Carolyn Matthews is a 61 y.o. female coming in with complaint of left knee pain. Patient's pain seems to be on the anterior aspect the knee. In 2 weeks ago patient went from a sitting to standing position with a severe amount of pain in the knee gave out on her. Since then has had swelling, throbbing aching sensation. Patient states that it feels like she has lost some movement. Having difficulty straightening the knee. Patient has tried some over-the-counter medications with very minimal improvement.     Past Medical History:  Diagnosis Date  . Allergy    seasonal  . Anemia   . Chronic headaches   . Family history of breast cancer in female    Daughter  . Fibrocystic breast disease   . GERD (gastroesophageal reflux disease)   . Hyperglycemia    Diet controlled  . MVP (mitral valve prolapse)   . Seasonal allergies   . 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 ANKLE FRACTURE SURGERY  05/2003  . BREAST BIOPSY  03/2004   Negative  . BREAST CYST ASPIRATION  05/2007   Bilateral-benign  . BREAST LUMPECTOMY  12/1977  . TOTAL ABDOMINAL HYSTERECTOMY  09/1996   Fibroids   Social History   Social History  . Marital status: Married    Spouse name: N/A  . Number of children: 2  . Years of education: N/A   Occupational History  . 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  [employer]   Social History Main Topics  . Smoking status: Never Smoker  . Smokeless tobacco: Never Used  . Alcohol use 0.0 oz/week     Comment: occasional use  . Drug use: No  . Sexual activity: Not Asked   Other Topics Concern  . None   Social History Narrative  . None   Allergies  Allergen Reactions  . Eggs Or  Egg-Derived Products Other (See Comments)    REACTION: SEVERE REACTION PV 4-22 pt states she gets flu like symptoms all but fever, no anaphylasis  . Hepatitis A Vaccine Other (See Comments)    ? Allergic reaction; itching,weakness almost immediate after taking 1st vaccine   Family History  Problem Relation Age of Onset  . Diabetes Father   . Hypertension Mother   . Breast cancer Daughter        (inflammatory)-now has brain tumor  . Diabetes Paternal Grandmother   . Breast cancer Unknown        great aunt  . Colon cancer Neg Hx   . Rectal cancer Neg Hx   . Stomach cancer Neg Hx   . Esophageal cancer Neg Hx   . Breast cancer Daughter        2nd daughter with breast cancer     Past medical history, social, surgical and family history all reviewed in electronic medical record.  No pertanent information unless stated regarding to the chief complaint.   Review of Systems:Review of systems updated and as accurate as of 06/23/16  No headache, visual changes, nausea, vomiting, diarrhea, constipation, dizziness, abdominal pain, skin rash, fevers, chills, night sweats, weight loss, swollen lymph nodes, body aches, joint swelling,chest pain, shortness of breath,  mood changes.  + muscle aches.,  Objective  Blood pressure 138/80, pulse 68, height 5\' 4"  (1.626 m), weight 161 lb (73 kg). Systems examined below as of 06/23/16   General: No apparent distress alert and oriented x3 mood and affect normal, dressed appropriately.  HEENT: Pupils equal, extraocular movements intact  Respiratory: Patient's speak in full sentences and does not appear short of breath  Cardiovascular: No lower extremity edema, non tender, no erythema  Skin: Warm dry intact with no signs of infection or rash on extremities or on axial skeleton.  Abdomen: Soft nontender  Neuro: Cranial nerves II through XII are intact, neurovascularly intact in all extremities with 2+ DTRs and 2+ pulses.  Lymph: No lymphadenopathy of  posterior or anterior cervical chain or axillae bilaterally.  Gait normal with good balance and coordination.  MSK:  Non tender with full range of motion and good stability and symmetric strength and tone of shoulders, elbows, wrist, hip and ankles bilaterally.  Knee:left  Normal to inspection with no erythema or effusion or obvious bony abnormalities. Tender over the patellofemoral joint Next last 5 of extension. Ligaments with solid consistent endpoints including ACL, PCL, LCL, MCL. Negative Mcmurray's, Apley's, and Thessalonian tests. painful patellar compression. Patellar glide without crepitus. Patellar and quadriceps tendons unremarkable. Hamstring and quadriceps strength is normal.  Contralateral knee unremarkable  MSK US performed of: Left knee This study was ordered, performed, and interpreted by Terrilee FilesZach Smith D.O.  Knee: Effusion noted. Very small overall. Moderate to severe narrowing of the patellofemoral joint. Mild to moderate narrowing of the medial joint space. No meniscal injury noted.Marland Kitchen.  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    Procedure note 97110; 15 minutes spent for Therapeutic exercises as stated in above notes.  This included exercises focusing on stretching, strengthening, with significant focus on eccentric aspects. Patellofemoral Syndrome Rehab  Isometric contractions of thigh - 10 x 10 secs  3 way straight leg raises - build to 3 sets of 30 and then add weights begin with no weight. When 3 x 30 reached, Add 2 lb. ankle weight. Increase to 3,4,5,6 when 3x30 achieved.  Drop squats - limit to 45 deg, 3x15  Modified lunge - running position, 3x15  Seated quad extensions, 3x15, add ankle weights  Step downs, 3x15 with body weight slowly on downward phase  Knee up and open hip: knee up and externally rotate hip to open position, hold 2 sec and repeat each leg, 30 reps  Cone Drills: Right Leg, Right Hand Right Leg, Left  Hand Left Leg, Right Hand Left Leg, Left Hand Start with 1 cone, progress to 3 20 each exercise  Lateral Leg Reach Balance knee, reach out laterally to cone and touch. Hold at Knee up position for 2 seconds 20 reps each leg  Rear Leg Reach Directly behind - place cone 20 reps   Proper technique shown and discussed handout in great detail with ATC.  All questions were discussed and answered.     Impression and Recommendations:     This case required medical decision making of moderate complexity.      Note: This dictation was prepared with Dragon dictation along with smaller phrase technology. Any transcriptional errors that result from this process are unintentional.

## 2016-06-23 ENCOUNTER — Encounter: Payer: Self-pay | Admitting: Family Medicine

## 2016-06-23 ENCOUNTER — Ambulatory Visit: Payer: Self-pay

## 2016-06-23 ENCOUNTER — Ambulatory Visit (INDEPENDENT_AMBULATORY_CARE_PROVIDER_SITE_OTHER): Payer: 59 | Admitting: Family Medicine

## 2016-06-23 VITALS — BP 138/80 | HR 68 | Ht 64.0 in | Wt 161.0 lb

## 2016-06-23 DIAGNOSIS — M25562 Pain in left knee: Secondary | ICD-10-CM | POA: Diagnosis not present

## 2016-06-23 DIAGNOSIS — S83002A Unspecified subluxation of left patella, initial encounter: Secondary | ICD-10-CM | POA: Diagnosis not present

## 2016-06-23 MED ORDER — DICLOFENAC SODIUM 2 % TD SOLN: 2.0000 "application " | Freq: Two times a day (BID) | TRANSDERMAL | 3 refills | Status: DC

## 2016-06-23 NOTE — Patient Instructions (Addendum)
Good to see you  Ice 20 minutes 2 times daily. Usually after activity and before bed. pennsaid pinkie amount topically 2 times daily as needed.  Exercises 3 times a week.  Wear brace with exercises and maybe daily for next 10 days Good shoes that are flat.  See me again in 3-4 weeks.

## 2016-06-23 NOTE — Assessment & Plan Note (Signed)
Patient did have what appears to be more like a patellar subluxation. Discussed with patient at great length. Patient will elected try conservative therapy. We discussed icing regimen, home exercise, which activities to do an which was to avoid. Patient will start increasing activity as tolerated. Patient work with Event organiserathletic trainer to learn exercises in greater detail. Topical anti-inflammatory's prescribed as well as given a brace. Follow-up again in 3-4 weeks.

## 2016-07-17 ENCOUNTER — Other Ambulatory Visit: Payer: Self-pay | Admitting: *Deleted

## 2016-07-17 MED ORDER — BUSPIRONE HCL 15 MG PO TABS: 15.0000 mg | ORAL_TABLET | Freq: Two times a day (BID) | ORAL | 11 refills | Status: DC | PRN

## 2016-07-17 NOTE — Telephone Encounter (Signed)
Will refill electronically  

## 2016-07-17 NOTE — Telephone Encounter (Signed)
Shajuan sent me a message saying:  [name], [name], CMA  Phone Number: [phone]        [name]:   Could you ask Dr. Milinda Antisower to renew my Buspirone HCL 15mg  prescription. The date has expired on the ones I have because I only take it when needed. [facility] can be sent to [facility].    Thanks,   Carolyn PrimroseLinda Kuenzel  [birth year]    **last filled on 12/12/14 #60 tabs with 11 additional refill, per message pt only takes prn, please advise

## 2016-07-21 ENCOUNTER — Other Ambulatory Visit: Payer: Self-pay

## 2016-07-21 ENCOUNTER — Ambulatory Visit (INDEPENDENT_AMBULATORY_CARE_PROVIDER_SITE_OTHER): Payer: 59 | Admitting: Family Medicine

## 2016-07-21 ENCOUNTER — Encounter: Payer: Self-pay | Admitting: Family Medicine

## 2016-07-21 DIAGNOSIS — S83002A Unspecified subluxation of left patella, initial encounter: Secondary | ICD-10-CM

## 2016-07-21 MED ORDER — VITAMIN D (ERGOCALCIFEROL) 1.25 MG (50000 UNIT) PO CAPS: 50000.0000 [IU] | ORAL_CAPSULE | ORAL | 0 refills | Status: DC

## 2016-07-21 NOTE — Progress Notes (Signed)
[facility] Torria Fromer D.O. [facility] [address] [place], [place] [address] Phone: [phone] Subjective:    I'm seeing this patient by the request  of:  Tower, Audrie GallusMarne A, MD  CC: Left knee pain f/u  BJY:[id number]:Subjective  Carolyn Matthews is a 61 y.o. female coming in with complaint of left knee pain. Patient sent have more of a patellar subluxation. Patient was to do conservative therapy including home exercises icing regimen. Patient states Improvement overall. Patient denies any worsening symptoms at the moment. Patient states that as long she wears the brace she has very minimal discomfort. States that the swelling has decreased significantly. Patient rates the severity pain is close to 4 out of 10. Patient states that no nighttime awakening and never given out on her. Has not had any other audible popping when she had previously.  Past Medical History:  Diagnosis Date  . Allergy    seasonal  . Anemia   . Chronic headaches   . Family history of breast cancer in female    Daughter  . Fibrocystic breast disease   . GERD (gastroesophageal reflux disease)   . Hyperglycemia    Diet controlled  . MVP (mitral valve prolapse)   . Seasonal allergies   . 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 ANKLE FRACTURE SURGERY  05/2003  . BREAST BIOPSY  03/2004   Negative  . BREAST CYST ASPIRATION  05/2007   Bilateral-benign  . BREAST LUMPECTOMY  12/1977  . TOTAL ABDOMINAL HYSTERECTOMY  09/1996   Fibroids   Social History   Social History  . Marital status: Married    Spouse name: N/A  . Number of children: 2  . Years of education: N/A   Occupational History  . 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  [employer]   Social History Main Topics  . Smoking status: Never Smoker  . Smokeless tobacco: Never Used  . Alcohol use 0.0 oz/week     Comment: occasional use  . Drug use: No  . Sexual activity: Not Asked    Other Topics Concern  . None   Social History Narrative  . None   Allergies  Allergen Reactions  . Eggs Or Egg-Derived Products Other (See Comments)    REACTION: SEVERE REACTION PV 4-22 pt states she gets flu like symptoms all but fever, no anaphylasis  . Hepatitis A Vaccine Other (See Comments)    ? Allergic reaction; itching,weakness almost immediate after taking 1st vaccine   Family History  Problem Relation Age of Onset  . Diabetes Father   . Hypertension Mother   . Breast cancer Daughter        (inflammatory)-now has brain tumor  . Diabetes Paternal Grandmother   . Breast cancer Unknown        great aunt  . Colon cancer Neg Hx   . Rectal cancer Neg Hx   . Stomach cancer Neg Hx   . Esophageal cancer Neg Hx   . Breast cancer Daughter        2nd daughter with breast cancer     Past medical history, social, surgical and family history all reviewed in electronic medical record.  No pertanent information unless stated regarding to the chief complaint.   Review of Systems: No headache, visual changes, nausea, vomiting, diarrhea, constipation, dizziness, abdominal pain, skin rash, fevers, chills, night sweats, weight loss, swollen lymph nodes, body aches, joint  swelling, chest pain, shortness of breath, mood changes.  Positive muscle aches Objective  Blood pressure 124/80, pulse 80, height 5\' 4"  (1.626 m), SpO2 98 %.   Systems examined below as of 07/21/16 General: NAD A&O x3 mood, affect normal  HEENT: Pupils equal, extraocular movements intact no nystagmus Respiratory: not short of breath at rest or with speaking Cardiovascular: No lower extremity edema, non tender Skin: Warm dry intact with no signs of infection or rash on extremities or on axial skeleton. Abdomen: Soft nontender, no masses Neuro: Cranial nerves  intact, neurovascularly intact in all extremities with 2+ DTRs and 2+ pulses. Lymph: No lymphadenopathy appreciated today  Gait normal with good balance  and coordination.  MSK: Non tender with full range of motion and good stability and symmetric strength and tone of shoulders, elbows, wrist,  hips and ankles bilaterally.    Knee:left  Normal to inspection with no erythema or effusion or obvious bony abnormalities. Still minimally tender over the patellofemoral joint Next last 5 of extension. Ligaments with solid consistent endpoints including ACL, PCL, LCL, MCL. Negative Mcmurray's, Apley's, and Thessalonian tests. painful patellar compression less than previous exam. Patellar glide without crepitus. Patellar and quadriceps tendons unremarkable. Hamstring and quadriceps strength is normal.  Contralateral knee unremarkable     Impression and Recommendations:     This case required medical decision making of moderate complexity.      Note: This dictation was prepared with Dragon dictation along with smaller phrase technology. Any transcriptional errors that result from this process are unintentional.

## 2016-07-21 NOTE — Patient Instructions (Addendum)
Good to see you  Carolyn Matthews is your friend.  Stay active.  New exercises 3 times a week  When sitting do not wear brace Once weekly vitamin D for 12 weeks.  See me again in 4 weeks to make sure you are doing well.

## 2016-07-21 NOTE — Assessment & Plan Note (Signed)
Encourage home exercises, and decreased bracing on a regular basis. We discussed icing regimen. Discussed which activities to do in which ones to avoid. Discussed with patient that if any worsening symptoms to come back sooner. Possible OCD is within the differential. Follow-up again in 4-8 weeks

## 2016-07-29 ENCOUNTER — Ambulatory Visit: Payer: 59 | Admitting: Family Medicine

## 2016-08-20 ENCOUNTER — Other Ambulatory Visit: Payer: Self-pay | Admitting: Family Medicine

## 2016-08-25 ENCOUNTER — Ambulatory Visit: Payer: 59 | Admitting: Family Medicine

## 2016-09-02 ENCOUNTER — Encounter: Payer: Self-pay | Admitting: Family Medicine

## 2016-09-02 ENCOUNTER — Ambulatory Visit (INDEPENDENT_AMBULATORY_CARE_PROVIDER_SITE_OTHER): Payer: 59 | Admitting: Family Medicine

## 2016-09-02 DIAGNOSIS — S83002A Unspecified subluxation of left patella, initial encounter: Secondary | ICD-10-CM | POA: Diagnosis not present

## 2016-09-02 NOTE — Progress Notes (Signed)
[facility] Scale[facility] Mischell Branford D.O. [facility] [address] [place], [place] [address] Phone: [phone] Subjective:    I'm seeing this patient by the request  of:  Tower, Audrie GallusMarne A, MD  CC: Left knee pain f/u  ION:[id number]:Subjective  Carolyn Matthews is a 61 y.o. female coming in with complaint of left knee pain. Patient sent have more of a patellar subluxation. Has been doing very well with conservative therapy. Patient has been doing relatively well overall. Patient states some very mild discomfort usually after sitting for long time. Patient rates the severity pain is now 2 out of 10. Any to 85% better. States that she is increasing her activity as well slowly. Feels that the exercises of much easier now. Not wearing the brace as much  Past Medical History:  Diagnosis Date  . Allergy    seasonal  . Anemia   . Chronic headaches   . Family history of breast cancer in female    Daughter  . Fibrocystic breast disease   . GERD (gastroesophageal reflux disease)   . Hyperglycemia    Diet controlled  . MVP (mitral valve prolapse)   . Seasonal allergies   . 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 ANKLE FRACTURE SURGERY  05/2003  . BREAST BIOPSY  03/2004   Negative  . BREAST CYST ASPIRATION  05/2007   Bilateral-benign  . BREAST LUMPECTOMY  12/1977  . TOTAL ABDOMINAL HYSTERECTOMY  09/1996   Fibroids   Social History   Social History  . Marital status: Married    Spouse name: N/A  . Number of children: 2  . Years of education: N/A   Occupational History  . 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  [employer]   Social History Main Topics  . Smoking status: Never Smoker  . Smokeless tobacco: Never Used  . Alcohol use 0.0 oz/week     Comment: occasional use  . Drug use: No  . Sexual activity: Not Asked   Other Topics Concern  . None   Social History Narrative  . None   Allergies  Allergen Reactions  . Eggs  Or Egg-Derived Products Other (See Comments)    REACTION: SEVERE REACTION PV 4-22 pt states she gets flu like symptoms all but fever, no anaphylasis  . Hepatitis A Vaccine Other (See Comments)    ? Allergic reaction; itching,weakness almost immediate after taking 1st vaccine   Family History  Problem Relation Age of Onset  . Diabetes Father   . Hypertension Mother   . Breast cancer Daughter        (inflammatory)-now has brain tumor  . Diabetes Paternal Grandmother   . Breast cancer Unknown        great aunt  . Colon cancer Neg Hx   . Rectal cancer Neg Hx   . Stomach cancer Neg Hx   . Esophageal cancer Neg Hx   . Breast cancer Daughter        2nd daughter with breast cancer     Past medical history, social, surgical and family history all reviewed in electronic medical record.  No pertanent information unless stated regarding to the chief complaint.   Review of Systems: No headache, visual changes, nausea, vomiting, diarrhea, constipation, dizziness, abdominal pain, skin rash, fevers, chills, night sweats, weight loss, swollen lymph nodes, body aches, joint swelling, chest pain, shortness of breath, mood changes.  Positive muscle aches Objective  Blood pressure 130/80, pulse 69, height 5\' 4"  (1.626 m), SpO2 97 %.   Systems examined below as of 09/02/16 General: NAD A&O x3 mood, affect normal  HEENT: Pupils equal, extraocular movements intact no nystagmus Respiratory: not short of breath at rest or with speaking Cardiovascular: No lower extremity edema, non tender Skin: Warm dry intact with no signs of infection or rash on extremities or on axial skeleton. Abdomen: Soft nontender, no masses Neuro: Cranial nerves  intact, neurovascularly intact in all extremities with 2+ DTRs and 2+ pulses. Lymph: No lymphadenopathy appreciated today  Gait normal with good balance and coordination.  MSK: Non tender with full range of motion and good stability and symmetric strength and tone of  shoulders, elbows, wrist,  hips and ankles bilaterally.    Knee:left  Normal to inspection with no erythema or effusion or obvious bony abnormalities. Nontender on exam Full range of motion Ligaments with solid consistent endpoints including ACL, PCL, LCL, MCL. Negative Mcmurray's, Apley's, and Thessalonian tests. painful patellar compression less than previous exam. Patellar glide with mild crepitus. Patellar and quadriceps tendons unremarkable. Hamstring and quadriceps strength is normal.  Contralateral knee unremarkable     Impression and Recommendations:     This case required medical decision making of moderate complexity.      Note: This dictation was prepared with Dragon dictation along with smaller phrase technology. Any transcriptional errors that result from this process are unintentional.

## 2016-09-02 NOTE — Patient Instructions (Signed)
You are doing great  Ice is your friend.  Stay active  Exercises 2 times a week  See me again when you need me

## 2016-09-02 NOTE — Assessment & Plan Note (Signed)
Doing much better overall. Continue conservative therapy. Can follow-up as needed.

## 2016-09-06 ENCOUNTER — Telehealth: Payer: 59 | Admitting: Nurse Practitioner

## 2016-09-06 DIAGNOSIS — M5441 Lumbago with sciatica, right side: Secondary | ICD-10-CM | POA: Diagnosis not present

## 2016-09-06 MED ORDER — NAPROXEN 500 MG PO TABS: 500.0000 mg | ORAL_TABLET | Freq: Two times a day (BID) | ORAL | 1 refills | Status: DC

## 2016-09-06 MED ORDER — CYCLOBENZAPRINE HCL 10 MG PO TABS: 10.0000 mg | ORAL_TABLET | Freq: Three times a day (TID) | ORAL | 1 refills | Status: DC | PRN

## 2016-09-06 NOTE — Progress Notes (Signed)

## 2016-09-17 ENCOUNTER — Encounter: Payer: 59 | Admitting: Family Medicine

## 2016-09-30 ENCOUNTER — Other Ambulatory Visit (INDEPENDENT_AMBULATORY_CARE_PROVIDER_SITE_OTHER): Payer: 59

## 2016-09-30 ENCOUNTER — Telehealth (INDEPENDENT_AMBULATORY_CARE_PROVIDER_SITE_OTHER): Payer: 59 | Admitting: Family Medicine

## 2016-09-30 DIAGNOSIS — D649 Anemia, unspecified: Secondary | ICD-10-CM | POA: Diagnosis not present

## 2016-09-30 DIAGNOSIS — E7800 Pure hypercholesterolemia, unspecified: Secondary | ICD-10-CM

## 2016-09-30 DIAGNOSIS — E559 Vitamin D deficiency, unspecified: Secondary | ICD-10-CM | POA: Diagnosis not present

## 2016-09-30 DIAGNOSIS — Z0000 Encounter for general adult medical examination without abnormal findings: Secondary | ICD-10-CM

## 2016-09-30 DIAGNOSIS — R739 Hyperglycemia, unspecified: Secondary | ICD-10-CM | POA: Diagnosis not present

## 2016-09-30 LAB — COMPREHENSIVE METABOLIC PANEL
ALBUMIN: 4.6 g/dL (ref 3.5–5.2)
ALK PHOS: 69 U/L (ref 39–117)
ALT: 11 U/L (ref 0–35)
AST: 15 U/L (ref 0–37)
BUN: 16 mg/dL (ref 6–23)
CALCIUM: 10 mg/dL (ref 8.4–10.5)
CHLORIDE: 104 meq/L (ref 96–112)
CO2: 33 mEq/L — ABNORMAL HIGH (ref 19–32)
Creatinine, Ser: 0.74 mg/dL (ref 0.40–1.20)
GFR: 103.05 mL/min (ref >60.00)
Glucose, Bld: 107 mg/dL — ABNORMAL HIGH (ref 70–99)
POTASSIUM: 4.3 meq/L (ref 3.5–5.1)
SODIUM: 142 meq/L (ref 135–145)
TOTAL PROTEIN: 7.4 g/dL (ref 6.0–8.3)
Total Bilirubin: 0.6 mg/dL (ref 0.2–1.2)

## 2016-09-30 LAB — FERRITIN: FERRITIN: 98.5 ng/mL (ref 10.0–291.0)

## 2016-09-30 LAB — CBC WITH DIFFERENTIAL/PLATELET
BASOS PCT: 0.2 % (ref 0.0–3.0)
Basophils Absolute: 0 10*3/uL (ref 0.0–0.1)
Eosinophils Absolute: 0.1 10*3/uL (ref 0.0–0.7)
Eosinophils Relative: 1.3 % (ref 0.0–5.0)
HEMATOCRIT: 35 % — AB (ref 36.0–46.0)
HEMOGLOBIN: 11.4 g/dL — AB (ref 12.0–15.0)
LYMPHS PCT: 27.5 % (ref 12.0–46.0)
Lymphs Abs: 2 10*3/uL (ref 0.7–4.0)
MCHC: 32.7 g/dL (ref 30.0–36.0)
MCV: 92.6 fl (ref 78.0–100.0)
MONO ABS: 0.4 10*3/uL (ref 0.1–1.0)
MONOS PCT: 5.1 % (ref 3.0–12.0)
Neutro Abs: 4.7 10*3/uL (ref 1.4–7.7)
Neutrophils Relative %: 65.9 % (ref 43.0–77.0)
Platelets: 363 10*3/uL (ref 150.0–400.0)
RBC: 3.78 Mil/uL — AB (ref 3.87–5.11)
RDW: 13.8 % (ref 11.5–15.5)
WBC: 7.2 10*3/uL (ref 4.0–10.5)

## 2016-09-30 LAB — LIPID PANEL
Cholesterol: 203 mg/dL — ABNORMAL HIGH (ref 0–200)
HDL: 52.6 mg/dL (ref >39.00)
LDL Cholesterol: 135 mg/dL — ABNORMAL HIGH (ref 0–99)
NONHDL: 150.44
Total CHOL/HDL Ratio: 4
Triglycerides: 75 mg/dL (ref 0.0–149.0)
VLDL: 15 mg/dL (ref 0.0–40.0)

## 2016-09-30 LAB — HEMOGLOBIN A1C: Hgb A1c MFr Bld: 6.1 % (ref 4.6–6.5)

## 2016-09-30 LAB — TSH: TSH: 1.52 u[IU]/mL (ref 0.35–4.50)

## 2016-09-30 LAB — VITAMIN D 25 HYDROXY (VIT D DEFICIENCY, FRACTURES): VITD: 39.27 ng/mL (ref 30.00–100.00)

## 2016-09-30 NOTE — Telephone Encounter (Signed)
-----   Message from Alvina Chou sent at 09/30/2016 10:46 AM EDT ----- Regarding: lab orders asap, walk in Patient is scheduled for CPX labs, please order future labs, Thanks , Camelia Eng

## 2016-10-08 ENCOUNTER — Ambulatory Visit (INDEPENDENT_AMBULATORY_CARE_PROVIDER_SITE_OTHER): Payer: 59 | Admitting: Family Medicine

## 2016-10-08 ENCOUNTER — Encounter: Payer: Self-pay | Admitting: Family Medicine

## 2016-10-08 VITALS — BP 134/72 | HR 94 | Temp 98.3°F | Ht 63.25 in | Wt 172.0 lb

## 2016-10-08 DIAGNOSIS — R739 Hyperglycemia, unspecified: Secondary | ICD-10-CM

## 2016-10-08 DIAGNOSIS — D649 Anemia, unspecified: Secondary | ICD-10-CM | POA: Diagnosis not present

## 2016-10-08 DIAGNOSIS — Z0000 Encounter for general adult medical examination without abnormal findings: Secondary | ICD-10-CM

## 2016-10-08 DIAGNOSIS — E559 Vitamin D deficiency, unspecified: Secondary | ICD-10-CM | POA: Diagnosis not present

## 2016-10-08 DIAGNOSIS — E7800 Pure hypercholesterolemia, unspecified: Secondary | ICD-10-CM | POA: Diagnosis not present

## 2016-10-08 NOTE — Progress Notes (Signed)
Subjective:    Patient ID: Carolyn Matthews, female    DOB: [birth year], 61 y.o.   MRN: [id number]  HPI Here for health maintenance exam and to review chronic medical problems    Very busy lately  Feeling ok   C/o some numbness and tingling in hands off an on  No hx of carpal tunnel    Wt Readings from Last 3 Encounters:  10/08/16 172 lb (78 kg)  06/23/16 161 lb (73 kg)  01/07/16 166 lb 12 oz (75.6 kg)  eating well overall  Needs to exercise more - but no time (likes to walk)  30.23 kg/m  Pap 11/05 (then had a hysterectomy)  No gyn symptoms   Mammogram 11/17 nl  2 daughters with breast cancer  Self breast exam -no lumps   Colonoscopy 5/16 Nl with 10 y recall / Dr Leone Payor   Td 1/ 08  Due for this   Exempt from flu shot due to severe rxn to hep A vaccine in the past  Also egg allergic  Does not want to risk it  Vit D level 39.2 Finishing high dose tx from Dr Katrinka Blazing  Hyperlipidemia Lab Results  Component Value Date   CHOL 203 (H) 09/30/2016   CHOL 195 10/01/2015   CHOL 195 09/05/2014   Lab Results  Component Value Date   HDL 52.60 09/30/2016   HDL 49.20 10/01/2015   HDL 55.00 09/05/2014   Lab Results  Component Value Date   LDLCALC 135 (H) 09/30/2016   LDLCALC 130 (H) 10/01/2015   LDLCALC 127 (H) 09/05/2014   Lab Results  Component Value Date   TRIG 75.0 09/30/2016   TRIG 80.0 10/01/2015   TRIG 65.0 09/05/2014   Lab Results  Component Value Date   CHOLHDL 4 09/30/2016   CHOLHDL 4 10/01/2015   CHOLHDL 4 09/05/2014   No results found for: LDLDIRECT   Eating some ice cream  Some red meat and fried foods   Hx of asymptomatic anemia  Lab Results  Component Value Date   WBC 7.2 09/30/2016   HGB 11.4 (L) 09/30/2016   HCT 35.0 (L) 09/30/2016   MCV 92.6 09/30/2016   PLT 363.0 09/30/2016  stable   Lab Results  Component Value Date   CREATININE 0.74 09/30/2016   BUN 16 09/30/2016   NA 142 09/30/2016   K 4.3 09/30/2016   CL 104  09/30/2016   CO2 33 (H) 09/30/2016    Lab Results  Component Value Date   ALT 11 09/30/2016   AST 15 09/30/2016   ALKPHOS 69 09/30/2016   BILITOT 0.6 09/30/2016    Lab Results  Component Value Date   TSH 1.52 09/30/2016    Glucose 107  Hyperglycemia Lab Results  Component Value Date   HGBA1C 6.1 09/30/2016   Fairly stable  Eating ice cream  Craving sugar a lot   Patient Active Problem List   Diagnosis Date Noted  . Patellar subluxation, left, initial encounter 06/23/2016  . Low back pain 01/07/2016  . Vitamin D deficiency 10/03/2015  . Heartburn 12/14/2014  . Chest pain 12/12/2014  . Encounter for vitamin deficiency screening 11/14/2014  . Cervical lymphadenitis 05/26/2014  . Thoracic back pain 01/13/2014  . Colon cancer screening 08/11/2012  . 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 Anxiety disorder 02/26/2011  . 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 Other screening mammogram 08/28/2010  . Routine general medical examination at a health care facility 05/23/2010  . MVP (mitral valve  prolapse)   . Chronic headaches   . Seasonal allergies   . Family history of breast cancer in female   . Hyperglycemia 04/04/2009  . Hyperlipidemia 11/22/2008  . Anemia, unspecified 02/12/2007  . OVERACTIVE BLADDER 01/11/2007  . FIBROCYSTIC BREAST DISEASE 01/11/2007   Past Medical History:  Diagnosis Date  . Allergy    seasonal  . Anemia   . Chronic headaches   . Family history of breast cancer in female    Daughter  . Fibrocystic breast disease   . GERD (gastroesophageal reflux disease)   . Hyperglycemia    Diet controlled  . MVP (mitral valve prolapse)   . Seasonal allergies   . 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 ANKLE FRACTURE SURGERY  05/2003  . BREAST BIOPSY  03/2004   Negative  . BREAST CYST ASPIRATION  05/2007   Bilateral-benign  . BREAST LUMPECTOMY  12/1977  . TOTAL ABDOMINAL HYSTERECTOMY  09/1996    Fibroids   Social History  Substance Use Topics  . Smoking status: Never Smoker  . Smokeless tobacco: Never Used  . Alcohol use 0.0 oz/week     Comment: occasional use   Family History  Problem Relation Age of Onset  . Diabetes Father   . Hypertension Mother   . Breast cancer Daughter        (inflammatory)-now has brain tumor  . Diabetes Paternal Grandmother   . Breast cancer Unknown        great aunt  . Colon cancer Neg Hx   . Rectal cancer Neg Hx   . Stomach cancer Neg Hx   . Esophageal cancer Neg Hx   . Breast cancer Daughter        2nd daughter with breast cancer    Allergies  Allergen Reactions  . Eggs Or Egg-Derived Products Other (See Comments)    REACTION: SEVERE REACTION PV 4-22 pt states she gets flu like symptoms all but fever, no anaphylasis  . Hepatitis A Vaccine Other (See Comments)    ? Allergic reaction; itching,weakness almost immediate after taking 1st vaccine   Current Outpatient Prescriptions on File Prior to Visit  Medication Sig Dispense Refill  . busPIRone (BUSPAR) 15 MG tablet Take 1 tablet (15 mg total) by mouth 2 (two) times daily as needed. 60 tablet 11  . cetirizine (ZYRTEC) 10 MG tablet Take 10 mg by mouth daily as needed.      . mometasone (NASONEX) 50 MCG/ACT nasal spray Place 2 sprays into the nose daily. 2 sprays in each nostril per day as needed 17 g 3  . Multiple Vitamin (MULTIVITAMIN) tablet Take 1 tablet by mouth daily.      . pseudoephedrine (SUDAFED) 120 MG 12 hr tablet Take 1 tablet (120 mg total) by mouth every 12 (twelve) hours as needed for congestion. 180 tablet 1  . Vitamin D, Ergocalciferol, (DRISDOL) 50000 units CAPS capsule Take 1 capsule (50,000 Units total) by mouth every 7 (seven) days. 12 capsule 0   No current facility-administered medications on file prior to visit.      Review of Systems  Constitutional: Negative for activity change, appetite change, fatigue, fever and unexpected weight change.  HENT: Negative  for congestion, ear pain, rhinorrhea, sinus pressure and sore throat.   Eyes: Negative for pain, redness and visual disturbance.  Respiratory: Negative for cough, shortness of breath and wheezing.   Cardiovascular: Negative for chest pain and palpitations.  Gastrointestinal: Negative for  abdominal pain, blood in stool, constipation and diarrhea.  Endocrine: Negative for polydipsia and polyuria.  Genitourinary: Negative for dysuria, frequency and urgency.  Musculoskeletal: Negative for arthralgias, back pain and myalgias.  Skin: Negative for pallor and rash.  Allergic/Immunologic: Negative for environmental allergies.  Neurological: Positive for numbness. Negative for dizziness, syncope and headaches.  Hematological: Negative for adenopathy. Does not bruise/bleed easily.  Psychiatric/Behavioral: Negative for decreased concentration and dysphoric mood. The patient is not nervous/anxious.        Objective:   Physical Exam  Constitutional: She appears well-developed and well-nourished. No distress.  overwt and well appearing   HENT:  Head: Normocephalic and atraumatic.  Right Ear: External ear normal.  Left Ear: External ear normal.  Mouth/Throat: Oropharynx is clear and moist.  Eyes: Pupils are equal, round, and reactive to light. Conjunctivae and EOM are normal. No scleral icterus.  Neck: Normal range of motion. Neck supple. No JVD present. Carotid bruit is not present. No thyromegaly present.  Cardiovascular: Normal rate, regular rhythm, normal heart sounds and intact distal pulses.  Exam reveals no gallop.   Pulmonary/Chest: Effort normal and breath sounds normal. No respiratory distress. She has no wheezes. She exhibits no tenderness.  Abdominal: Soft. Bowel sounds are normal. She exhibits no distension, no abdominal bruit and no mass. There is no tenderness.  Genitourinary: No breast swelling, tenderness, discharge or bleeding.  Genitourinary Comments: Breast exam: No mass, nodules,  thickening, tenderness, bulging, retraction, inflamation, nipple discharge or skin changes noted.  No axillary or clavicular LA.      Musculoskeletal: Normal range of motion. She exhibits no edema or tenderness.  No kyphosis  Lymphadenopathy:    She has no cervical adenopathy.  Neurological: She is alert. She has normal reflexes. No cranial nerve deficit. She exhibits normal muscle tone. Coordination normal.  Skin: Skin is warm and dry. No rash noted. No erythema. No pallor.  Skin tags diffusely  Psychiatric: She has a normal mood and affect.          Assessment & Plan:   Problem List Items Addressed This Visit      Other   Anemia, unspecified    Stable Asymptomatic       Hyperglycemia    Lab Results  Component Value Date   HGBA1C 6.1 09/30/2016   disc imp of low glycemic diet and wt loss to prevent DM2       Hyperlipidemia    Disc goals for lipids and reasons to control them Rev labs with pt Rev low sat fat diet in detail  She plans to cut back on ice cream/red meat and fried foods       Routine general medical examination at a health care facility    Reviewed health habits including diet and exercise and skin cancer prevention Reviewed appropriate screening tests for age  Also reviewed health mt list, fam hx and immunization status , as well as social and family history   See HPI Labs rev  Your mammogram is due in November- don't forget to schedule that   You are due for a tetanus shot when you get back on your trip  Disc low cholesterol and sugar diet  Working on exercise plan      Vitamin D deficiency - Primary    Finishing high dose tx and level in the 30s Will inc daily dose to 4000 iu to keep it up

## 2016-10-08 NOTE — Patient Instructions (Addendum)
Try to find time to exercise - try to schedule it in  Think about buying a used treadmill   Your mammogram is due in November- don't forget to schedule that   You are due for a tetanus shot when you get back on your trip   For cholesterol  Avoid red meat/ fried foods/ egg yolks/ fatty breakfast meats/ butter, cheese and high fat dairy/ and shellfish    For blood sugar/ diabetes prevention and weight loss Try to get most of your carbohydrates from produce (with the exception of white potatoes)  Eat less bread/pasta/rice/snack foods/cereals/sweets and other items from the middle of the grocery store (processed carbs)   Finish your high dose D  Then take 4000 iu every day

## 2016-10-09 NOTE — Assessment & Plan Note (Signed)
Stable. Asymptomatic.

## 2016-10-09 NOTE — Assessment & Plan Note (Signed)
Lab Results  Component Value Date   HGBA1C 6.1 09/30/2016   disc imp of low glycemic diet and wt loss to prevent DM2

## 2016-10-09 NOTE — Assessment & Plan Note (Signed)
Reviewed health habits including diet and exercise and skin cancer prevention Reviewed appropriate screening tests for age  Also reviewed health mt list, fam hx and immunization status , as well as social and family history   See HPI Labs rev  Your mammogram is due in November- don't forget to schedule that   You are due for a tetanus shot when you get back on your trip  Disc low cholesterol and sugar diet  Working on exercise plan

## 2016-10-09 NOTE — Assessment & Plan Note (Signed)
Disc goals for lipids and reasons to control them Rev labs with pt Rev low sat fat diet in detail  She plans to cut back on ice cream/red meat and fried foods

## 2016-10-09 NOTE — Assessment & Plan Note (Signed)
Finishing high dose tx and level in the 30s Will inc daily dose to 4000 iu to keep it up

## 2016-11-05 ENCOUNTER — Other Ambulatory Visit: Payer: Self-pay | Admitting: Family Medicine

## 2016-11-05 DIAGNOSIS — Z1231 Encounter for screening mammogram for malignant neoplasm of breast: Secondary | ICD-10-CM

## 2016-11-23 ENCOUNTER — Telehealth: Payer: 59 | Admitting: Physician Assistant

## 2016-11-23 DIAGNOSIS — M549 Dorsalgia, unspecified: Secondary | ICD-10-CM

## 2016-11-23 NOTE — Progress Notes (Signed)
Based on what you shared with me it looks like you have a serious condition that should be evaluated in a face to face office visit. If the pain is so severe you cannot move, you need to call 911  NOTE: Even if you have entered your credit card information for this eVisit, you will not be charged.   If you are having a true medical emergency please call 911.  If you need an urgent face to face visit, [place] has four urgent care centers for your convenience.  If you need care fast and have a high deductible or no insurance consider:   [email or web address]  [phone]  [address], [address] [place], [place] [address] 8 am to 8 pm Monday-Friday 10 am to 4 pm Saturday-Sunday   The following sites will take your  insurance:    . [facility]  [phone] Get Driving Directions Find a Provider at [email or web address] Location  [address] [place], [place] [address] . 10 am to 8 pm Monday-Friday . 12 pm to 8 pm Saturday-Sunday   . [facility]  [phone] Get Driving Directions Find a Provider at this Location  [address], [address] [place], [place] [address] . 8 am to 8 pm Monday-Friday . 9 am to 6 pm Saturday . 11 am to 6 pm Sunday   . [facility] [place]  [phone] Get Driving Directions  [address] [place], [place] [address] . 8 am to 8 pm Monday-Friday . 8 am to 4 pm Saturday-Sunday   Your e-visit answers were reviewed by a board certified advanced clinical practitioner to complete your personal care plan.  Thank you for using e-Visits.

## 2016-11-24 ENCOUNTER — Telehealth: Payer: Self-pay | Admitting: Family Medicine

## 2016-11-24 DIAGNOSIS — R202 Paresthesia of skin: Secondary | ICD-10-CM | POA: Diagnosis not present

## 2016-11-24 DIAGNOSIS — M5430 Sciatica, unspecified side: Secondary | ICD-10-CM | POA: Diagnosis not present

## 2016-11-24 DIAGNOSIS — M545 Low back pain: Secondary | ICD-10-CM | POA: Diagnosis not present

## 2016-11-24 DIAGNOSIS — R110 Nausea: Secondary | ICD-10-CM | POA: Diagnosis not present

## 2016-11-24 DIAGNOSIS — M5442 Lumbago with sciatica, left side: Secondary | ICD-10-CM | POA: Diagnosis not present

## 2016-11-24 DIAGNOSIS — I10 Essential (primary) hypertension: Secondary | ICD-10-CM | POA: Diagnosis not present

## 2016-11-24 DIAGNOSIS — M79605 Pain in left leg: Secondary | ICD-10-CM | POA: Diagnosis not present

## 2016-11-24 NOTE — Telephone Encounter (Signed)
Left voicemail letting pt know she needs to schedule a f/u with 1st available provider or Dr. Milinda Antisower to re-eval back pain

## 2016-11-24 NOTE — Telephone Encounter (Signed)
Copied from CRM #[id number]. Topic: Quick Communication - See Telephone Encounter >> Nov 24, 2016  2:16 PM Landry MellowFoltz, Melissa J wrote: CRM for notification. See Telephone encounter for:  11/24/16  Pt is having back pain and hamstring pain, she has naproxen twice a day for 3 days, as well as cyclobenzaprine 3 times a day.  Her pain has radiated from her back to leg pain to the back of her knee and thigh.  cb number for pt is [phone].

## 2016-11-24 NOTE — Telephone Encounter (Signed)
Looks like she had an e visit for this- needs to be seen when she can get an appt/ needs to be examined  Please put her in with me or first available

## 2016-11-25 DIAGNOSIS — M79605 Pain in left leg: Secondary | ICD-10-CM | POA: Diagnosis not present

## 2016-11-25 DIAGNOSIS — R531 Weakness: Secondary | ICD-10-CM | POA: Diagnosis not present

## 2016-11-25 DIAGNOSIS — M5430 Sciatica, unspecified side: Secondary | ICD-10-CM | POA: Diagnosis not present

## 2016-11-25 DIAGNOSIS — M545 Low back pain: Secondary | ICD-10-CM | POA: Diagnosis not present

## 2016-11-25 DIAGNOSIS — R109 Unspecified abdominal pain: Secondary | ICD-10-CM | POA: Diagnosis not present

## 2016-11-25 DIAGNOSIS — E041 Nontoxic single thyroid nodule: Secondary | ICD-10-CM | POA: Diagnosis not present

## 2016-11-26 ENCOUNTER — Telehealth: Payer: Self-pay | Admitting: Family Medicine

## 2016-11-26 MED ORDER — TRAMADOL HCL 50 MG PO TABS: 50.0000 mg | ORAL_TABLET | Freq: Three times a day (TID) | ORAL | 0 refills | Status: DC | PRN

## 2016-11-26 NOTE — Telephone Encounter (Signed)
Rx called in as prescribed ([facility] [place]), I sent Bonita QuinLinda a message letting her know Dr. Royden Purlower's comments

## 2016-11-26 NOTE — Telephone Encounter (Signed)
Pt went to ER and scheduled a ER f/u with Dr. Milinda Antisower

## 2016-11-26 NOTE — Telephone Encounter (Signed)
Melynda flagged me -she needs pain medication for her back to get her by until I see her on the 30th  She got oxycodone from the ED Tell her I cannot give her that due to the narcotic STOP act in [place]   I will write for tramadol-hopefully this will help  Please call it in

## 2016-12-02 ENCOUNTER — Ambulatory Visit (INDEPENDENT_AMBULATORY_CARE_PROVIDER_SITE_OTHER): Payer: 59 | Admitting: Family Medicine

## 2016-12-02 ENCOUNTER — Encounter: Payer: Self-pay | Admitting: Family Medicine

## 2016-12-02 ENCOUNTER — Telehealth: Payer: Self-pay | Admitting: Family Medicine

## 2016-12-02 ENCOUNTER — Ambulatory Visit (INDEPENDENT_AMBULATORY_CARE_PROVIDER_SITE_OTHER)
Admission: RE | Admit: 2016-12-02 | Discharge: 2016-12-02 | Disposition: A | Discharge status: Home / Self Care | Payer: 59 | Source: Ambulatory Visit | Attending: Family Medicine | Admitting: Family Medicine

## 2016-12-02 VITALS — BP 126/72 | HR 86 | Temp 98.1°F | Wt 176.5 lb

## 2016-12-02 DIAGNOSIS — M5416 Radiculopathy, lumbar region: Secondary | ICD-10-CM

## 2016-12-02 DIAGNOSIS — M545 Low back pain: Secondary | ICD-10-CM | POA: Diagnosis not present

## 2016-12-02 MED ORDER — MELOXICAM 15 MG PO TABS: 15.0000 mg | ORAL_TABLET | Freq: Every day | ORAL | 1 refills | Status: DC

## 2016-12-02 MED ORDER — CYCLOBENZAPRINE HCL 10 MG PO TABS: 10.0000 mg | ORAL_TABLET | Freq: Three times a day (TID) | ORAL | 1 refills | Status: DC | PRN

## 2016-12-02 NOTE — Telephone Encounter (Signed)
I was about to order MRI of spine but could not remember if she still had screws in her ankle- ? Please ask her  If they are metal that may affect ability to do an mri

## 2016-12-02 NOTE — Progress Notes (Signed)
Subjective:    Patient ID: Carolyn Matthews, female    DOB: 1955/11/10, 61 y.o.   MRN: [id number]  HPI  Here for f/u of back pain  Was in the ED  [facility] on 10/22 for back pain and LLE pain   Px medrol dosepack Flexeril  Oxycodone (20 tabs)  5-325  CT angio of chest and abd aortogram done    Imaging: Ct Angio Chest Abd Aortogram  Result Date: 11/25/2016 INDICATION: abd pain, weakness in L leg, eval for dissection. COMPARISON: None. TECHNIQUE: CTA chest abdomen with contrast was performed using 100 mL mL of Isovue 370. Multiplanar 2D and angiographic 3D MIP images were constructed and reviewed.. Radiation dose reduction was utilized (automated exposure control, mA or kV adjustment based on patient size, or iterative image reconstruction). Contrast bolus quality: satisfactory FINDINGS: SUPPORT APPARATUS: N.A. LUNGS/PLEURA: - No pulmonary emboli are identified. - No focal airspace opacities/consolidations. - No pneumothorax. - No abnormal pulmonary masses. - No pleural effusions. HEART/MEDIASTINUM: - Cardiac size is normal. - No acute thoracic aortic abnormalities. No evidence of aortic dissection or aneurysm. Major branches including the celiac axis, SMA, and IMA are patent without evidence of high-grade stenosis or occlusions. Splenic and hepatic arteries are patent.. - No hilar, mediastinal, or axillary lymphadenopathy. Tiny left thyroid lobe nodule. SOLID VISCERA: - Liver: Intact. - Gallbladder: No cholecystitis or radiodense gallstones. - Pancreas: Intact. - Adrenal glands: Intact. - Spleen: Intact. - Kidneys: No acute findings. GI: - No bowel obstruction. - On axial image 161 series 10 the tip in the appendix measures up to 1.1 cm with wall hyperenhancement which may reflect early changes of tip appendicitis. However there is no periappendiceal stranding and finding may possibly physiologic in this patient. PERITONEAL CAVITY/RETROPERITONEUM: - No free fluid. - No pneumoperitoneum. -  No lymphadenopathy. PELVIS: - No acute abnormalities. MUSCULOSKELETAL: - No acute or destructive osseous processes. MISC: - N/A or as above.   IMPRESSION: 1. No pulmonary embolism. 2. The tip in the appendix measures up to 1.1 cm with wall hyperenhancement which may reflect early changes of tip appendicitis. However there is no periappendiceal stranding and finding may possibly physiologic in this patient. Clinically correlate with patient's symptoms and follow-up if patient's symptoms persist. 3. No evidence of acute thoracic dissection. ###CODE SIGNIFICANT REPORT### Electronically Signed by: Abner Greenspan   Electronically Signed: Raynaldo Opitz, MD 11/25/2016 2:30 AM  Of note- spine xrays were not done    Called for pain med- we subs tramadol   Some improvement-definitely all the way  Back is better  Now most of pain is in the back of L thigh radiating down all the way to foot  Pain is constant and moderate / not dull or burning  Half of foot is numb (ball of foot and heel) -tingly No loss of strength in foot /no foot drop  No loss of bowel or bladder control at all   Sitting makes it worse Standing is bad after a few minutes  Flexing and ext rotating L hip helps and leaning back helps  Lying on her back she cannot fully extend leg /puts a pillow under it  Bending forward not as bad   This started when carrying laundry upstairs-sudden sharp pain in back   A few mo ago had some back pain that resolved without leg pain    bp was elevated 159/77  BP Readings from Last 3 Encounters:  12/02/16 126/72  10/08/16 134/72  09/02/16 130/80  Finished steroid   Patient Active Problem List   Diagnosis Date Noted  . Lumbar back pain with radiculopathy affecting left lower extremity 12/02/2016  . Patellar subluxation, left, initial encounter 06/23/2016  . Low back pain 01/07/2016  . Vitamin D deficiency 10/03/2015  . Encounter for vitamin deficiency screening 11/14/2014  .  Cervical lymphadenitis 05/26/2014  . Thoracic back pain 01/13/2014  . Colon cancer screening 08/11/2012  . 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 Anxiety disorder 02/26/2011  . 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 Other screening mammogram 08/28/2010  . Routine general medical examination at a health care facility 05/23/2010  . MVP (mitral valve prolapse)   . Chronic headaches   . Seasonal allergies   . Family history of breast cancer in female   . Hyperglycemia 04/04/2009  . Hyperlipidemia 11/22/2008  . Anemia, unspecified 02/12/2007  . OVERACTIVE BLADDER 01/11/2007  . FIBROCYSTIC BREAST DISEASE 01/11/2007   Past Medical History:  Diagnosis Date  . Allergy    seasonal  . Anemia   . Chronic headaches   . Family history of breast cancer in female    Daughter  . Fibrocystic breast disease   . GERD (gastroesophageal reflux disease)   . Hyperglycemia    Diet controlled  . MVP (mitral valve prolapse)   . Seasonal allergies   . 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 ANKLE FRACTURE SURGERY  05/2003  . BREAST BIOPSY  03/2004   Negative  . BREAST CYST ASPIRATION  05/2007   Bilateral-benign  . BREAST LUMPECTOMY  12/1977  . TOTAL ABDOMINAL HYSTERECTOMY  09/1996   Fibroids   Social History  Substance Use Topics  . Smoking status: Never Smoker  . Smokeless tobacco: Never Used  . Alcohol use 0.0 oz/week     Comment: occasional use   Family History  Problem Relation Age of Onset  . Diabetes Father   . Hypertension Mother   . Breast cancer Daughter        (inflammatory)-now has brain tumor  . Diabetes Paternal Grandmother   . Breast cancer Unknown        great aunt  . Colon cancer Neg Hx   . Rectal cancer Neg Hx   . Stomach cancer Neg Hx   . Esophageal cancer Neg Hx   . Breast cancer Daughter        2nd daughter with breast cancer    Allergies  Allergen Reactions  . Eggs Or Egg-Derived Products Other (See  Comments)    REACTION: SEVERE REACTION PV 4-22 pt states she gets flu like symptoms all but fever, no anaphylasis  . Hepatitis A Vaccine Other (See Comments)    ? Allergic reaction; itching,weakness almost immediate after taking 1st vaccine   Current Outpatient Prescriptions on File Prior to Visit  Medication Sig Dispense Refill  . busPIRone (BUSPAR) 15 MG tablet Take 1 tablet (15 mg total) by mouth 2 (two) times daily as needed. 60 tablet 11  . cetirizine (ZYRTEC) 10 MG tablet Take 10 mg by mouth daily as needed.      . mometasone (NASONEX) 50 MCG/ACT nasal spray Place 2 sprays into the nose daily. 2 sprays in each nostril per day as needed 17 g 3  . Multiple Vitamin (MULTIVITAMIN) tablet Take 1 tablet by mouth daily.      . pseudoephedrine (SUDAFED) 120 MG 12 hr tablet Take 1 tablet (120 mg total) by mouth every 12 (twelve) hours  as needed for congestion. 180 tablet 1  . traMADol (ULTRAM) 50 MG tablet Take 1-2 tablets (50-100 mg total) by mouth every 8 (eight) hours as needed. 45 tablet 0  . Vitamin D, Ergocalciferol, (DRISDOL) 50000 units CAPS capsule Take 1 capsule (50,000 Units total) by mouth every 7 (seven) days. 12 capsule 0   No current facility-administered medications on file prior to visit.      Review of Systems  Constitutional: Negative for activity change, appetite change, fatigue, fever and unexpected weight change.  HENT: Negative for congestion, ear pain, rhinorrhea, sinus pressure and sore throat.   Eyes: Negative for pain, redness and visual disturbance.  Respiratory: Negative for cough, shortness of breath and wheezing.   Cardiovascular: Negative for chest pain and palpitations.  Gastrointestinal: Negative for abdominal pain, blood in stool, constipation and diarrhea.  Endocrine: Negative for polydipsia and polyuria.  Genitourinary: Negative for dysuria, frequency and urgency.  Musculoskeletal: Positive for back pain. Negative for arthralgias and myalgias.  Skin:  Negative for pallor and rash.  Allergic/Immunologic: Negative for environmental allergies.  Neurological: Positive for numbness. Negative for dizziness, syncope and headaches.  Hematological: Negative for adenopathy. Does not bruise/bleed easily.  Psychiatric/Behavioral: Negative for decreased concentration and dysphoric mood. The patient is not nervous/anxious.        Objective:   Physical Exam  Constitutional: She appears well-developed and well-nourished. No distress.  overwt and well appearing   HENT:  Head: Normocephalic and atraumatic.  Eyes: Pupils are equal, round, and reactive to light. Conjunctivae and EOM are normal. No scleral icterus.  Neck: Normal range of motion. Neck supple.  Cardiovascular: Normal rate and regular rhythm.   Pulmonary/Chest: Effort normal and breath sounds normal. She has no wheezes. She has no rales.  Abdominal: Soft. Bowel sounds are normal. She exhibits no distension. There is no tenderness.  Musculoskeletal: She exhibits tenderness. She exhibits no edema.       Right shoulder: She exhibits decreased range of motion, tenderness, bony tenderness, deformity and spasm. She exhibits no swelling, no effusion, no crepitus, normal pulse and normal strength.       Lumbar back: She exhibits decreased range of motion, tenderness and spasm. She exhibits no bony tenderness and no edema.  L lumbar spasm Tenderness over L3-S1 Pain with flex past 30 deg and ext of 10 deg  Also with R lat bend  Nl gait No foot drop  No neuro changes  No LLE tenderness or swelling    Lymphadenopathy:    She has no cervical adenopathy.  Neurological: She is alert. She has normal strength and normal reflexes. She displays no atrophy. No cranial nerve deficit or sensory deficit. She exhibits normal muscle tone. Coordination normal.  Negative SLR  Skin: Skin is warm and dry. No rash noted. No erythema. No pallor.  Psychiatric: She has a normal mood and affect.            Assessment & Plan:   Problem List Items Addressed This Visit      Nervous and Auditory   Lumbar back pain with radiculopathy affecting left lower extremity    Severe low back and L leg pain - currently disabling pt  Was given oxycodone in ED and tramadol from myself-not helping much  Also flexeril (refilled)  Steroid pack -may have helped mildly  Suspect spinal stenosis vs sciatica (pain worse with extension)  Neuro symptoms in L leg but nl exam Xray now -will likely need MRI  Update if symptoms worsen- given  red flags for cauda equina syndrome or disc herniation       Relevant Medications   meloxicam (MOBIC) 15 MG tablet   cyclobenzaprine (FLEXERIL) 10 MG tablet   Other Relevant Orders   DG Lumbar Spine Complete (Completed)

## 2016-12-02 NOTE — Telephone Encounter (Signed)
Thanks

## 2016-12-02 NOTE — Patient Instructions (Signed)
Take meloxicam daily with food  Flexeril as needed Tramadol as needed  Slow walking will help   Xray now  We may need MRI

## 2016-12-02 NOTE — Assessment & Plan Note (Addendum)
Severe low back and L leg pain - currently disabling pt  Was given oxycodone in ED and tramadol from myself-not helping much  Reviewed hospital records, lab results and studies in detail  (CT angiogram rev) Also flexeril (refilled)  Steroid pack -may have helped mildly -finished Px meloxicam  Suspect spinal stenosis vs sciatica (pain worse with extension)  Neuro symptoms in L leg but nl exam Xray now -will likely need MRI  Update if symptoms worsen- given red flags for cauda equina syndrome or disc herniation

## 2016-12-02 NOTE — Telephone Encounter (Signed)
Patient requested a Friday Appt. [facility] at [facility] for 12/05/16 and patient aware.

## 2016-12-02 NOTE — Telephone Encounter (Signed)
I think it will be ok even if she does but need to double check

## 2016-12-02 NOTE — Telephone Encounter (Signed)
Ordering MRI urgent for severe back pain with radiculopathy  [facility]

## 2016-12-02 NOTE — Telephone Encounter (Signed)
Carolyn Matthews said she does have screws in her ankle, but if you check into it and it's okay she does want the MRI

## 2016-12-03 ENCOUNTER — Telehealth: Payer: Self-pay | Admitting: Family Medicine

## 2016-12-03 NOTE — Telephone Encounter (Signed)
See attached

## 2016-12-03 NOTE — Telephone Encounter (Signed)
Copied from CRM #[id number]. Topic: Quick Communication - See Telephone Encounter >> Dec 03, 2016  2:24 PM Landry MellowFoltz, Melissa J wrote: CRM for notification. See Telephone encounter for:  12/03/16. Pt needs rx called in for medication to help her relax during MRI. She is claustrophobic. [facility] [place] [facility] [place].

## 2016-12-04 MED ORDER — ALPRAZOLAM 0.5 MG PO TABS: 0.5000 mg | ORAL_TABLET | Freq: Once | ORAL | 0 refills | Status: AC

## 2016-12-04 NOTE — Telephone Encounter (Signed)
Please call in for her - [facility] [place] [facility] [address]

## 2016-12-04 NOTE — Telephone Encounter (Signed)
[facility]ed [facility] [place] stating that her request has been completed/thx dmf

## 2016-12-05 ENCOUNTER — Ambulatory Visit
Admission: RE | Admit: 2016-12-05 | Discharge: 2016-12-05 | Disposition: A | Discharge status: Home / Self Care | Payer: 59 | Source: Ambulatory Visit | Attending: Family Medicine | Admitting: Family Medicine

## 2016-12-05 DIAGNOSIS — M545 Low back pain: Secondary | ICD-10-CM | POA: Diagnosis not present

## 2016-12-05 DIAGNOSIS — M47816 Spondylosis without myelopathy or radiculopathy, lumbar region: Secondary | ICD-10-CM | POA: Diagnosis not present

## 2016-12-05 DIAGNOSIS — M5416 Radiculopathy, lumbar region: Secondary | ICD-10-CM | POA: Insufficient documentation

## 2016-12-05 DIAGNOSIS — M5126 Other intervertebral disc displacement, lumbar region: Secondary | ICD-10-CM | POA: Insufficient documentation

## 2016-12-08 ENCOUNTER — Telehealth: Payer: Self-pay | Admitting: Family Medicine

## 2016-12-08 DIAGNOSIS — M5441 Lumbago with sciatica, right side: Secondary | ICD-10-CM

## 2016-12-08 DIAGNOSIS — M5136 Other intervertebral disc degeneration, lumbar region: Secondary | ICD-10-CM

## 2016-12-08 DIAGNOSIS — M5442 Lumbago with sciatica, left side: Principal | ICD-10-CM

## 2016-12-08 DIAGNOSIS — M51369 Other intervertebral disc degeneration, lumbar region without mention of lumbar back pain or lower extremity pain: Secondary | ICD-10-CM | POA: Insufficient documentation

## 2016-12-08 MED ORDER — TRAMADOL HCL 50 MG PO TABS: 50.0000 mg | ORAL_TABLET | Freq: Three times a day (TID) | ORAL | 0 refills | Status: DC | PRN

## 2016-12-08 NOTE — Telephone Encounter (Signed)
-----   Message from Shon MilletShapale M [place], [place] sent at 12/08/2016  5:11 PM EST ----- Pt advise of MRI results she agrees with referral to ortho, [place] if possible.  Pt did want me to ask Dr. Milinda Antisower to refill her tramadol, she is out of meds and she knows it will be a while before we can get her in to see an ortho doc, last refilled on 11/26/16 #45 tablets with 0 refill, [facility] [place] [facility] [place]

## 2016-12-08 NOTE — Telephone Encounter (Signed)
Medication phoned to pharmacy.  

## 2016-12-08 NOTE — Telephone Encounter (Signed)
Referral done  [facility]  Px written for call in  For tramadol

## 2016-12-09 NOTE — Telephone Encounter (Signed)
Medication called into wrong pharmacy, [facility] and [facility] [place] [facility] pt [facility]

## 2016-12-09 NOTE — Telephone Encounter (Signed)
Appt made with Dr Yevette Edwardsumonski on 12/19/16 anf patient aware.

## 2016-12-19 DIAGNOSIS — M5416 Radiculopathy, lumbar region: Secondary | ICD-10-CM | POA: Diagnosis not present

## 2016-12-19 DIAGNOSIS — M545 Low back pain: Secondary | ICD-10-CM | POA: Diagnosis not present

## 2016-12-19 MED FILL — METHOCARBAMOL 500 MG TABS: 500 | 15 days supply | Qty: 60 | Fill #0

## 2016-12-23 DIAGNOSIS — K219 Gastro-esophageal reflux disease without esophagitis: Secondary | ICD-10-CM | POA: Diagnosis not present

## 2016-12-23 DIAGNOSIS — H903 Sensorineural hearing loss, bilateral: Secondary | ICD-10-CM | POA: Diagnosis not present

## 2016-12-23 DIAGNOSIS — H9319 Tinnitus, unspecified ear: Secondary | ICD-10-CM | POA: Diagnosis not present

## 2016-12-23 MED FILL — OMEPRAZOLE DR 40 MG CAPSULE: 40 | 90 days supply | Qty: 90 | Fill #0

## 2016-12-24 ENCOUNTER — Ambulatory Visit
Admission: RE | Admit: 2016-12-24 | Discharge: 2016-12-24 | Disposition: A | Discharge status: Home / Self Care | Payer: 59 | Source: Ambulatory Visit | Attending: Family Medicine | Admitting: Family Medicine

## 2016-12-24 DIAGNOSIS — Z1231 Encounter for screening mammogram for malignant neoplasm of breast: Secondary | ICD-10-CM | POA: Diagnosis not present

## 2016-12-25 ENCOUNTER — Telehealth: Payer: 59 | Admitting: Physician Assistant

## 2016-12-25 DIAGNOSIS — R6883 Chills (without fever): Secondary | ICD-10-CM

## 2016-12-25 DIAGNOSIS — J069 Acute upper respiratory infection, unspecified: Secondary | ICD-10-CM

## 2016-12-25 NOTE — Progress Notes (Signed)
Based on what you shared with me it looks like you have a condition that should be evaluated in a face to face office visit.  Since you are having a hard time describing what is going on and note the issue with the swallowing, this needs a good physical examination to determine the issue and best treatment.   NOTE: Even if you have entered your credit card information for this eVisit, you will not be charged.   If you are having a true medical emergency please call 911.  If you need an urgent face to face visit, [facility] has four urgent care centers for your convenience.  If you need care fast and have a high deductible or no insurance consider:   [email or web address]  [phone]  [address], [address] [place], [place] [address] 8 am to 8 pm Monday-Friday 10 am to 4 pm Saturday-Sunday   The following sites will take your  insurance:    [facility]  [phone] Get Driving Directions Find a Provider at this Location  [address] [place], [place] [address] 10 am to 8 pm Monday-Friday 12 pm to 8 pm [facility] [place]   [facility]  [phone] Get Driving Directions Find a Provider at this Location  [address] [place], [place] [address] 8 am to 8 pm Monday-Friday 9 am to 6 pm Saturday 11 am to 6 pm Sunday   [facility] [place]  [phone] Get Driving Directions  [address] [place], [place] [address] 8 am to 8 pm Monday-Friday 8 am to 4 pm Saturday-Sunday   Your e-visit answers were reviewed by a board certified advanced clinical practitioner to complete your personal care plan.  Thank you for using e-Visits.  ===View-only below this line===   ----- Message -----    From: Carolyn GableLinda Fay Matthews    Sent: 12/25/2016  3:28 PM EST      To: E-Visit Mailing List Subject: E-Visit Submission: Sinus Problems  E-Visit Submission: Sinus  Problems --------------------------------  Question: Which of the following have you been experiencing? Answer:   Congested nose            Throat pain            Cough  Question: Have these symptoms significantly worsened over the last two to three days? Answer:   Yes  Question: Have you had any of the following? Answer:   Difficulty swallowing  Question: Please enter a few details about your swallowing, breathing, or visual problems. Answer:   When I swallow it feels like there's something in my throat that needs to go down, but after I swallow it's still there.  Question: How long have you been having these symptoms? Answer:   4 days  Question: Do you have a fever? Answer:   No, I do not have a fever  Question: Do you smoke? Answer:   No  Question: Have you ever smoked? Answer:   I have never smoked  Question: Do you have any chronic illnesses, such as diabetes, heart disease, or lung disease, or any illness that would weaken your body's ability to fight infection? Answer:   No  Question: When you blow your nose, what color is the mucus? Answer:   Mostly clear  Question: Have you experienced similar problems in the past? Answer:   Yes  Question: What treatments have worked in the past?  Answer:   Antibiotics,  allergy meds  Question: What treatment(s) in the past have been unsuccessful? Answer:   Thinks to bring up the mucus.  I don't care what I take, nothing will bring up the mucus.  So there's not really anything to blow out.  I drains mostly.  Question: Is this illness similar to previous illnesses you have had?  How is it the same?  How is it different? Answer:   It's different in the way that at the beginning my body temperature would change from hot to cold every minutes.  Now it's changes about every couple of hours.  Question: Have you recently been hospitalized? Answer:   No  Question: What medications are you currently taking for these symptoms? Answer:    Decongestants            Pain medicine            Nose spray            Antibiotics  Question: Please enter the names of any medications you are taking, or any other treatments you are trying. Answer:   Allergy nose spray, generic Zyrtac, 12hr generic Sudafed,  I have taken 3 antibiotics that start with the letters All I believe that were left over from a previous sinus infection. (They are not with me right now. That's why I don't completely know the name of them) I know they are stronger than Amoxicillin.  The doctor told me that when he prescribed them for me before.  I started feeling a little better when I took the antibiotics, but I still feel bad.  Question: Are you pregnant? Answer:   I am confident that I am not pregnant  Question: Please list your medication allergies that you may have ? (If 'none' , please list as 'none') Answer:   NONE  Question: Please list any additional comments  Answer:   I feel weak and don't understand why I keep having hot and cold chills but have no fever.  I put that it's sinus problems but I honestly don't know what the problem is.

## 2017-01-22 ENCOUNTER — Telehealth: Payer: Self-pay

## 2017-01-22 NOTE — Telephone Encounter (Signed)
Copied from CRM [phone]. Topic: Appointment Scheduling - Scheduling Inquiry for Clinic >> Jan 22, 2017 10:10 AM [name], Carolyn J wrote: Reason for CRM: pt would like to have tetanus shot, it has been over 10 years since her last one.  It was mentioned at her last cpe (10-08-16) Pt would like to have Jan 18 between 1030 -1230, pt will be in office with her mom.  Cb [phone]

## 2017-01-22 NOTE — Telephone Encounter (Signed)
Left v/m requesting pt cb to schedule nurse visit.

## 2017-01-22 NOTE — Telephone Encounter (Signed)
appt scheduled

## 2017-01-23 DIAGNOSIS — H5213 Myopia, bilateral: Secondary | ICD-10-CM | POA: Diagnosis not present

## 2017-01-23 DIAGNOSIS — H524 Presbyopia: Secondary | ICD-10-CM | POA: Diagnosis not present

## 2017-02-20 ENCOUNTER — Ambulatory Visit (INDEPENDENT_AMBULATORY_CARE_PROVIDER_SITE_OTHER): Payer: 59

## 2017-02-20 DIAGNOSIS — Z23 Encounter for immunization: Secondary | ICD-10-CM

## 2017-02-24 MED FILL — SUDOGEST 12 HOUR 120 MG CAP: 120 | 50 days supply | Qty: 100 | Fill #0

## 2017-02-25 MED FILL — busPIRone HCL 15 MG TABS: 15 | 15 days supply | Qty: 30 | Fill #0

## 2017-03-04 NOTE — Progress Notes (Signed)
Tawana ScaleZach Rochell Mabie D.O. [facility] [address] [place], [place] [address] Phone: [phone] Subjective:      CC: Bilateral arm pain  BJY:[id number]:Subjective  Carolyn GableLinda Fay Matthews is a 62 y.o. female coming in with complaint of bilateral arm pain. She has weakness. Keeps her up at night. Pain radiates up arm. She states it started in the left. Says her shoulder subluxed a few weeks ago.   Onset- Chronic Location- Elbow Duration- Consistent Character- Achy Aggravating factors- Movement, taking jacket off Reliving factors- Oral Therapies tried-  Severity- 5/10 but has been moving boxes when asked about new activities.      Past Medical History:  Diagnosis Date  . Allergy    seasonal  . Anemia   . Chronic headaches   . Family history of breast cancer in female    Daughter  . Fibrocystic breast disease   . GERD (gastroesophageal reflux disease)   . Hyperglycemia    Diet controlled  . MVP (mitral valve prolapse)   . Seasonal allergies   . 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 ANKLE FRACTURE SURGERY  05/2003  . BREAST BIOPSY  03/2004   Negative  . BREAST CYST ASPIRATION  05/2007   Bilateral-benign  . BREAST LUMPECTOMY  12/1977  . TOTAL ABDOMINAL HYSTERECTOMY  09/1996   Fibroids   Social History   Socioeconomic History  . Marital status: Married    Spouse name: None  . Number of children: 2  . Years of education: None  . Highest education level: None  Social Needs  . Financial resource strain: None  . Food insecurity - worry: None  . Food insecurity - inability: None  . Transportation needs - medical: None  . Transportation needs - non-medical: None  Occupational History  . Occupation: Chartered loss adjusterMedical Front Office    Employer: [employer]    Comment: [employer]    Employer: [employer]  Tobacco Use  . Smoking status: Never Smoker  . Smokeless tobacco: Never Used  Substance and Sexual Activity  . Alcohol  use: Yes    Alcohol/week: 0.0 oz    Comment: occasional use  . Drug use: No  . Sexual activity: None  Other Topics Concern  . None  Social History Narrative  . None   Allergies  Allergen Reactions  . Eggs Or Egg-Derived Products Other (See Comments)    REACTION: SEVERE REACTION PV 4-22 pt states she gets flu like symptoms all but fever, no anaphylasis  . Hepatitis A Vaccine Other (See Comments)    ? Allergic reaction; itching,weakness almost immediate after taking 1st vaccine   Family History  Problem Relation Age of Onset  . Diabetes Father   . Hypertension Mother   . Breast cancer Daughter        (inflammatory)-now has brain tumor  . Diabetes Paternal Grandmother   . Breast cancer Unknown        great aunt  . Breast cancer Daughter        2nd daughter with breast cancer   . Colon cancer Neg Hx   . Rectal cancer Neg Hx   . Stomach cancer Neg Hx   . Esophageal cancer Neg Hx      Past medical history, social, surgical and family history all reviewed in electronic medical record.  No pertanent information unless stated regarding to the chief complaint.   Review of Systems:Review of systems updated and as accurate  No  headache, visual changes, nausea, vomiting, diarrhea, constipation, dizziness, abdominal pain, skin rash, fevers, chills, night sweats, weight loss, swollen lymph nodes, body aches, joint swelling, muscle aches, chest pain, shortness of breath, mood changes.   Objective  Blood pressure (!) 150/84, pulse 87, height 5\' 4"  (1.626 m), weight 171 lb (77.6 kg), SpO2 96 %.   General: No apparent distress alert and oriented x3 mood and affect normal, dressed appropriately.  HEENT: Pupils equal, extraocular movements intact  Respiratory: Patient's speak in full sentences and does not appear short of breath  Cardiovascular: No lower extremity edema, non tender, no erythema  Skin: Warm dry intact with no signs of infection or rash on extremities or on axial skeleton.    Abdomen: Soft nontender  Neuro: Cranial nerves II through XII are intact, neurovascularly intact in all extremities with 2+ DTRs and 2+ pulses.  Lymph: No lymphadenopathy of posterior or anterior cervical chain or axillae bilaterally.  Gait normal with good balance and coordination.  MSK:  Non tender with full range of motion and good stability and symmetric strength and tone of  elbows, wrist, hip, knee and ankles bilaterally.   Neck: Inspection loss of lordosis. No palpable stepoffs. Negative Spurling's maneuver. Significant decrease in range of motion in all multiple planes.  Crepitus noted Grip strength and sensation normal in bilateral hands Strength good C4 to T1 distribution No sensory change to C4 to T1 Negative Hoffman sign bilaterally Reflexes normal  Shoulder exam shows the patient does have some tightness with impingement.  Mild positive speeds test bilaterally.  Rotator cuff strength 4+ out of 5 compared to the contralateral side.   Impression and Recommendations:     This case required medical decision making of moderate complexity.      Note: This dictation was prepared with Dragon dictation along with smaller phrase technology. Any transcriptional errors that result from this process are unintentional.

## 2017-03-05 ENCOUNTER — Encounter: Payer: Self-pay | Admitting: Family Medicine

## 2017-03-05 ENCOUNTER — Ambulatory Visit (INDEPENDENT_AMBULATORY_CARE_PROVIDER_SITE_OTHER): Payer: 59 | Admitting: Family Medicine

## 2017-03-05 ENCOUNTER — Ambulatory Visit (INDEPENDENT_AMBULATORY_CARE_PROVIDER_SITE_OTHER)
Admission: RE | Admit: 2017-03-05 | Discharge: 2017-03-05 | Disposition: A | Discharge status: Home / Self Care | Payer: 59 | Source: Ambulatory Visit | Attending: Family Medicine | Admitting: Family Medicine

## 2017-03-05 VITALS — BP 150/84 | HR 87 | Ht 64.0 in | Wt 171.0 lb

## 2017-03-05 DIAGNOSIS — M79601 Pain in right arm: Secondary | ICD-10-CM | POA: Diagnosis not present

## 2017-03-05 DIAGNOSIS — M79602 Pain in left arm: Secondary | ICD-10-CM

## 2017-03-05 DIAGNOSIS — M47812 Spondylosis without myelopathy or radiculopathy, cervical region: Secondary | ICD-10-CM | POA: Diagnosis not present

## 2017-03-05 MED ORDER — GABAPENTIN 100 MG PO CAPS: 200.0000 mg | ORAL_CAPSULE | Freq: Every day | ORAL | 3 refills | Status: DC

## 2017-03-05 MED FILL — GABAPENTIN 100 MG CAPS: 100 | 30 days supply | Qty: 60 | Fill #0

## 2017-03-05 NOTE — Patient Instructions (Addendum)
Good to see you  Gabapentin 200mg  at night Xray downstairs Over the counter get CoQ10 100mg  daily  Stay hydrated Stretch after packing  See em again in 2-4 weeks to make sure making progress.

## 2017-03-06 ENCOUNTER — Encounter: Payer: Self-pay | Admitting: Family Medicine

## 2017-03-06 DIAGNOSIS — M79601 Pain in right arm: Secondary | ICD-10-CM | POA: Insufficient documentation

## 2017-03-06 DIAGNOSIS — M79602 Pain in left arm: Secondary | ICD-10-CM

## 2017-03-06 NOTE — Assessment & Plan Note (Signed)
Patient does have more of a bilateral arm pain.  Could be secondary to cervical radiculopathy.  Started on gabapentin.  Discussed icing regimen and home exercises.  Discussed which activities of doing which wants to avoid.  Patient will try topical anti-inflammatories.  Follow-up again in 4 weeks.

## 2017-05-12 MED FILL — SUDOGEST 12 HOUR 120 MG CAP: 120 | 50 days supply | Qty: 100 | Fill #1

## 2017-05-27 MED FILL — FLUTICASONE PROP 50 MCG SPR: 50 | 90 days supply | Qty: 48 | Fill #0

## 2017-07-22 ENCOUNTER — Other Ambulatory Visit: Payer: Self-pay

## 2017-07-22 MED ORDER — PSEUDOEPHEDRINE HCL ER 120 MG PO TB12: 120.0000 mg | ORAL_TABLET | Freq: Two times a day (BID) | ORAL | 2 refills | Status: DC | PRN

## 2017-07-22 MED FILL — SM NASAL DECONGEST ER 120 M: 120 | 90 days supply | Qty: 180 | Fill #0

## 2017-09-24 MED FILL — FLUTICASONE PROP 50 MCG SPR: 50 | 90 days supply | Qty: 48 | Fill #1

## 2017-10-12 ENCOUNTER — Encounter: Payer: 59 | Admitting: Family Medicine

## 2017-11-02 MED FILL — SM NASAL DECONGEST ER 120 M: 120 | 90 days supply | Qty: 180 | Fill #1

## 2017-12-02 ENCOUNTER — Encounter: Payer: Self-pay | Admitting: Family Medicine

## 2017-12-02 ENCOUNTER — Ambulatory Visit (INDEPENDENT_AMBULATORY_CARE_PROVIDER_SITE_OTHER): Payer: 59 | Admitting: Family Medicine

## 2017-12-02 VITALS — BP 128/80 | HR 73 | Temp 97.5°F | Ht 63.25 in | Wt 170.5 lb

## 2017-12-02 DIAGNOSIS — E559 Vitamin D deficiency, unspecified: Secondary | ICD-10-CM

## 2017-12-02 DIAGNOSIS — Z0000 Encounter for general adult medical examination without abnormal findings: Secondary | ICD-10-CM

## 2017-12-02 DIAGNOSIS — D649 Anemia, unspecified: Secondary | ICD-10-CM | POA: Diagnosis not present

## 2017-12-02 DIAGNOSIS — R739 Hyperglycemia, unspecified: Secondary | ICD-10-CM | POA: Diagnosis not present

## 2017-12-02 DIAGNOSIS — E7800 Pure hypercholesterolemia, unspecified: Secondary | ICD-10-CM | POA: Diagnosis not present

## 2017-12-02 LAB — LIPID PANEL
CHOL/HDL RATIO: 4
CHOLESTEROL: 191 mg/dL (ref 0–200)
HDL: 53.2 mg/dL (ref >39.00)
LDL CALC: 124 mg/dL — AB (ref 0–99)
NonHDL: 137.35
TRIGLYCERIDES: 66 mg/dL (ref 0.0–149.0)
VLDL: 13.2 mg/dL (ref 0.0–40.0)

## 2017-12-02 LAB — COMPREHENSIVE METABOLIC PANEL
ALT: 12 U/L (ref 0–35)
AST: 17 U/L (ref 0–37)
Albumin: 4.6 g/dL (ref 3.5–5.2)
Alkaline Phosphatase: 75 U/L (ref 39–117)
BUN: 17 mg/dL (ref 6–23)
CALCIUM: 9.9 mg/dL (ref 8.4–10.5)
CHLORIDE: 101 meq/L (ref 96–112)
CO2: 30 mEq/L (ref 19–32)
CREATININE: 0.91 mg/dL (ref 0.40–1.20)
GFR: 80.86 mL/min (ref >60.00)
Glucose, Bld: 104 mg/dL — ABNORMAL HIGH (ref 70–99)
POTASSIUM: 4.2 meq/L (ref 3.5–5.1)
Sodium: 139 mEq/L (ref 135–145)
Total Bilirubin: 0.6 mg/dL (ref 0.2–1.2)
Total Protein: 7.9 g/dL (ref 6.0–8.3)

## 2017-12-02 LAB — CBC WITH DIFFERENTIAL/PLATELET
BASOS PCT: 0.6 % (ref 0.0–3.0)
Basophils Absolute: 0 10*3/uL (ref 0.0–0.1)
EOS ABS: 0.1 10*3/uL (ref 0.0–0.7)
Eosinophils Relative: 1.8 % (ref 0.0–5.0)
HEMATOCRIT: 36.6 % (ref 36.0–46.0)
Hemoglobin: 12.1 g/dL (ref 12.0–15.0)
LYMPHS PCT: 25.3 % (ref 12.0–46.0)
Lymphs Abs: 1.7 10*3/uL (ref 0.7–4.0)
MCHC: 33.1 g/dL (ref 30.0–36.0)
MCV: 91.6 fl (ref 78.0–100.0)
Monocytes Absolute: 0.4 10*3/uL (ref 0.1–1.0)
Monocytes Relative: 6.4 % (ref 3.0–12.0)
NEUTROS ABS: 4.5 10*3/uL (ref 1.4–7.7)
Neutrophils Relative %: 65.9 % (ref 43.0–77.0)
Platelets: 378 10*3/uL (ref 150.0–400.0)
RBC: 3.99 Mil/uL (ref 3.87–5.11)
RDW: 13.3 % (ref 11.5–15.5)
WBC: 6.8 10*3/uL (ref 4.0–10.5)

## 2017-12-02 LAB — VITAMIN D 25 HYDROXY (VIT D DEFICIENCY, FRACTURES): VITD: 32.56 ng/mL (ref 30.00–100.00)

## 2017-12-02 LAB — TSH: TSH: 2.35 u[IU]/mL (ref 0.35–4.50)

## 2017-12-02 LAB — HEMOGLOBIN A1C: HEMOGLOBIN A1C: 6.1 % (ref 4.6–6.5)

## 2017-12-02 MED ORDER — BUSPIRONE HCL 15 MG PO TABS: 15.0000 mg | ORAL_TABLET | Freq: Two times a day (BID) | ORAL | 3 refills | Status: DC | PRN

## 2017-12-02 MED FILL — busPIRone HCL 15 MG TABS: 15 | 90 days supply | Qty: 180 | Fill #0

## 2017-12-02 NOTE — Assessment & Plan Note (Signed)
Reviewed health habits including diet and exercise and skin cancer prevention Reviewed appropriate screening tests for age  Also reviewed health mt list, fam hx and immunization status , as well as social and family history   See HPI Labs ordered Disc health habits Enc exercise  No flu shot due to allergy

## 2017-12-02 NOTE — Patient Instructions (Addendum)
Try to get most of your carbohydrates from produce (with the exception of white potatoes)  Eat less bread/pasta/rice/snack foods/cereals/sweets and other items from the middle of the grocery store (processed carbs)  For cholesterol  Avoid red meat/ fried foods/ egg yolks/ fatty breakfast meats/ butter, cheese and high fat dairy/ and shellfish    Make an ice cream rule- to not buy it   Try to eat healthy   Take care of yourself   Try to get some exercise 5 days per week

## 2017-12-02 NOTE — Progress Notes (Signed)
Subjective:    Patient ID: Carolyn Matthews, female    DOB: 09/22/1955, 62 y.o.   MRN: [id number]  HPI Here for health maintenance exam and to review chronic medical problems    Stress-caring for mother with alz at home   Feeling pretty good overall   Legs get swollen from sitting all day    Wt Readings from Last 3 Encounters:  12/02/17 170 lb 8 oz (77.3 kg)  03/05/17 171 lb (77.6 kg)  12/02/16 176 lb 8 oz (80.1 kg)  diet is fair  She craves sugar - that is hard on her  Does not drink enough water  29.96 kg/m   Pap 11/05 Then had a hysterectomy No gyn problems   Mammogram 11/18 neg-has that scheduled already Self breast exam - no lumps or changes  2 daughters with breast cancer   Colonoscopy 5/16 -nl with 10 y recall   Tetanus shot 1/19   Past egg allergy and severe rxn to hep A vaccine in the past  Exempt from flu shot   BP BP Readings from Last 3 Encounters:  12/02/17 128/80  03/05/17 (!) 150/84  12/02/16 126/72   Pulse Readings from Last 3 Encounters:  12/02/17 73  03/05/17 87  12/02/16 86     H/o anemia  Due for labs  Asymptomatic Lab Results  Component Value Date   WBC 7.2 09/30/2016   HGB 11.4 (L) 09/30/2016   HCT 35.0 (L) 09/30/2016   MCV 92.6 09/30/2016   PLT 363.0 09/30/2016    Due for cholesterol check Lab Results  Component Value Date   CHOL 203 (H) 09/30/2016   HDL 52.60 09/30/2016   LDLCALC 135 (H) 09/30/2016   TRIG 75.0 09/30/2016   CHOLHDL 4 09/30/2016  eats too much ice cream  Avoids fried food  Beef-not a lot  A little sausage and bacon  Not open to cholesterol medicine at all     Lab Results  Component Value Date   HGBA1C 6.1 09/30/2016   Vit D def= 2000 iu daily and not on high dose tx  Due for level    Patient Active Problem List   Diagnosis Date Noted  . Bilateral arm pain 03/06/2017  . Lumbar degenerative disc disease 12/08/2016  . Lumbar back pain with radiculopathy affecting left lower extremity  12/02/2016  . Patellar subluxation, left, initial encounter 06/23/2016  . Low back pain 01/07/2016  . Vitamin D deficiency 10/03/2015  . Encounter for vitamin deficiency screening 11/14/2014  . Cervical lymphadenitis 05/26/2014  . Thoracic back pain 01/13/2014  . Colon cancer screening 08/11/2012  . 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 Anxiety disorder 02/26/2011  . 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 Other screening mammogram 08/28/2010  . Routine general medical examination at a health care facility 05/23/2010  . MVP (mitral valve prolapse)   . Chronic headaches   . Seasonal allergies   . Family history of breast cancer in female   . Hyperglycemia 04/04/2009  . Hyperlipidemia 11/22/2008  . Anemia, unspecified 02/12/2007  . OVERACTIVE BLADDER 01/11/2007  . FIBROCYSTIC BREAST DISEASE 01/11/2007   Past Medical History:  Diagnosis Date  . Allergy    seasonal  . Anemia   . Chronic headaches   . Family history of breast cancer in female    Daughter  . Fibrocystic breast disease   . GERD (gastroesophageal reflux disease)   . Hyperglycemia    Diet controlled  . MVP (mitral valve prolapse)   .  Seasonal allergies   . 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 ANKLE FRACTURE SURGERY  05/2003  . BREAST BIOPSY  03/2004   Negative  . BREAST CYST ASPIRATION  05/2007   Bilateral-benign  . BREAST LUMPECTOMY  12/1977  . TOTAL ABDOMINAL HYSTERECTOMY  09/1996   Fibroids   Social History   Tobacco Use  . Smoking status: Never Smoker  . Smokeless tobacco: Never Used  Substance Use Topics  . Alcohol use: Yes    Alcohol/week: 0.0 standard drinks    Comment: occasional use  . Drug use: No   Family History  Problem Relation Age of Onset  . Diabetes Father   . Hypertension Mother   . Breast cancer Daughter        (inflammatory)-now has brain tumor  . Diabetes Paternal Grandmother   . Breast cancer Unknown         great aunt  . Breast cancer Daughter        2nd daughter with breast cancer   . Colon cancer Neg Hx   . Rectal cancer Neg Hx   . Stomach cancer Neg Hx   . Esophageal cancer Neg Hx    Allergies  Allergen Reactions  . Eggs Or Egg-Derived Products Other (See Comments)    REACTION: SEVERE REACTION PV 4-22 pt states she gets flu like symptoms all but fever, no anaphylasis  . Hepatitis A Vaccine Other (See Comments)    ? Allergic reaction; itching,weakness almost immediate after taking 1st vaccine   Current Outpatient Medications on File Prior to Visit  Medication Sig Dispense Refill  . cetirizine (ZYRTEC) 10 MG tablet Take 10 mg by mouth daily as needed.      . mometasone (NASONEX) 50 MCG/ACT nasal spray Place 2 sprays into the nose daily. 2 sprays in each nostril per day as needed 17 g 3  . Multiple Vitamin (MULTIVITAMIN) tablet Take 1 tablet by mouth daily.      . pseudoephedrine (SUDAFED) 120 MG 12 hr tablet Take 1 tablet (120 mg total) by mouth every 12 (twelve) hours as needed for congestion. 180 tablet 2   No current facility-administered medications on file prior to visit.     Review of Systems  Constitutional: Positive for fatigue. Negative for activity change, appetite change, fever and unexpected weight change.  HENT: Negative for congestion, ear pain, rhinorrhea, sinus pressure and sore throat.   Eyes: Negative for pain, redness and visual disturbance.  Respiratory: Negative for cough, shortness of breath and wheezing.   Cardiovascular: Negative for chest pain and palpitations.  Gastrointestinal: Negative for abdominal pain, blood in stool, constipation and diarrhea.  Endocrine: Negative for polydipsia and polyuria.  Genitourinary: Negative for dysuria, frequency and urgency.  Musculoskeletal: Negative for arthralgias, back pain and myalgias.  Skin: Negative for pallor and rash.  Allergic/Immunologic: Negative for environmental allergies.  Neurological: Negative for  dizziness, syncope and headaches.  Hematological: Negative for adenopathy. Does not bruise/bleed easily.  Psychiatric/Behavioral: Negative for decreased concentration and dysphoric mood. The patient is not nervous/anxious.        Stressors        Objective:   Physical Exam  Constitutional: She appears well-developed and well-nourished. No distress.  overwt and well appearing   HENT:  Head: Normocephalic and atraumatic.  Right Ear: External ear normal.  Left Ear: External ear normal.  Mouth/Throat: Oropharynx is clear and moist.  Eyes: Pupils are equal, round, and reactive to light.  Conjunctivae and EOM are normal. No scleral icterus.  Neck: Normal range of motion. Neck supple. No JVD present. Carotid bruit is not present. No thyromegaly present.  Cardiovascular: Normal rate, regular rhythm, normal heart sounds and intact distal pulses. Exam reveals no gallop.  Pulmonary/Chest: Effort normal and breath sounds normal. No respiratory distress. She has no wheezes. She has no rales. She exhibits no tenderness. No breast tenderness, discharge or bleeding.  Abdominal: Soft. Bowel sounds are normal. She exhibits no distension, no abdominal bruit and no mass. There is no tenderness.  Genitourinary: No breast tenderness, discharge or bleeding.  Genitourinary Comments: Breast exam: No mass, nodules, thickening, tenderness, bulging, retraction, inflamation, nipple discharge or skin changes noted.  No axillary or clavicular LA.    Dense tissue   Musculoskeletal: Normal range of motion. She exhibits no edema or tenderness.  Lymphadenopathy:    She has no cervical adenopathy.  Neurological: She is alert. She has normal reflexes. She displays normal reflexes. No cranial nerve deficit. She exhibits normal muscle tone. Coordination normal.  Skin: Skin is warm and dry. No rash noted. No erythema. No pallor.  Skin tags scattered   Psychiatric: She has a normal mood and affect.  Pleasant             Assessment & Plan:   Problem List Items Addressed This Visit      Other   Anemia, unspecified    Chronic  Asymptomatic Cbc today      Relevant Orders   CBC with Differential/Platelet (Completed)   Hyperglycemia    A1C today  disc imp of low glycemic diet and wt loss to prevent DM2  Disc trying to keep ice cream out of the house      Relevant Orders   Hemoglobin A1c (Completed)   Hyperlipidemia    Disc goals for lipids and reasons to control them Rev last labs with pt Rev low sat fat diet in detail Panel today  Eats some high fat dairy  Not open to cholesterol medication       Relevant Orders   Comprehensive metabolic panel (Completed)   Lipid panel (Completed)   Routine general medical examination at a health care facility - Primary    Reviewed health habits including diet and exercise and skin cancer prevention Reviewed appropriate screening tests for age  Also reviewed health mt list, fam hx and immunization status , as well as social and family history   See HPI Labs ordered Disc health habits Enc exercise  No flu shot due to allergy       Relevant Orders   TSH (Completed)   Vitamin D deficiency   Relevant Orders   VITAMIN D 25 Hydroxy (Vit-D Deficiency, Fractures) (Completed)

## 2017-12-02 NOTE — Assessment & Plan Note (Signed)
Chronic  Asymptomatic Cbc today

## 2017-12-02 NOTE — Assessment & Plan Note (Signed)
Disc goals for lipids and reasons to control them Rev last labs with pt Rev low sat fat diet in detail Panel today  Eats some high fat dairy  Not open to cholesterol medication

## 2017-12-02 NOTE — Assessment & Plan Note (Signed)
A1C today  disc imp of low glycemic diet and wt loss to prevent DM2  Disc trying to keep ice cream out of the house

## 2017-12-17 ENCOUNTER — Other Ambulatory Visit: Payer: Self-pay | Admitting: Family Medicine

## 2017-12-17 DIAGNOSIS — Z1231 Encounter for screening mammogram for malignant neoplasm of breast: Secondary | ICD-10-CM

## 2018-01-04 ENCOUNTER — Ambulatory Visit: Payer: 59

## 2018-01-13 ENCOUNTER — Ambulatory Visit
Admission: RE | Admit: 2018-01-13 | Discharge: 2018-01-13 | Disposition: A | Discharge status: Home / Self Care | Payer: 59 | Source: Ambulatory Visit | Attending: Family Medicine | Admitting: Family Medicine

## 2018-01-13 DIAGNOSIS — Z1231 Encounter for screening mammogram for malignant neoplasm of breast: Secondary | ICD-10-CM | POA: Diagnosis not present

## 2018-02-23 ENCOUNTER — Encounter: Payer: Self-pay | Admitting: Family Medicine

## 2018-02-24 MED ORDER — OMEPRAZOLE 40 MG PO CPDR: 40.0000 mg | DELAYED_RELEASE_CAPSULE | Freq: Every day | ORAL | 3 refills | Status: DC | PRN

## 2018-02-25 MED FILL — OMEPRAZOLE 40 MG CPDR: 40 | 90 days supply | Qty: 90 | Fill #0

## 2018-03-15 MED FILL — SM NASAL DECONGEST ER 120 M: 120 | 90 days supply | Qty: 180 | Fill #2

## 2018-03-19 ENCOUNTER — Ambulatory Visit: Payer: 59 | Admitting: Family Medicine

## 2018-03-19 ENCOUNTER — Encounter: Payer: Self-pay | Admitting: Family Medicine

## 2018-03-19 VITALS — BP 146/78 | HR 82 | Temp 97.9°F | Ht 63.25 in | Wt 177.5 lb

## 2018-03-19 DIAGNOSIS — R0789 Other chest pain: Secondary | ICD-10-CM | POA: Diagnosis not present

## 2018-03-19 DIAGNOSIS — F419 Anxiety disorder, unspecified: Secondary | ICD-10-CM

## 2018-03-19 DIAGNOSIS — R739 Hyperglycemia, unspecified: Secondary | ICD-10-CM | POA: Diagnosis not present

## 2018-03-19 DIAGNOSIS — R252 Cramp and spasm: Secondary | ICD-10-CM | POA: Insufficient documentation

## 2018-03-19 DIAGNOSIS — K219 Gastro-esophageal reflux disease without esophagitis: Secondary | ICD-10-CM | POA: Insufficient documentation

## 2018-03-19 MED ORDER — BUSPIRONE HCL 5 MG PO TABS: 5.0000 mg | ORAL_TABLET | Freq: Two times a day (BID) | ORAL | 3 refills | Status: DC

## 2018-03-19 NOTE — Patient Instructions (Addendum)
To prevent diabetes Try to get most of your carbohydrates from produce (with the exception of white potatoes)  Eat less bread/pasta/rice/snack foods/cereals/sweets and other items from the middle of the grocery store (processed carbs)  It works for weight loss also   Sweets or sticky food may cause a reflux reaction / esophageal spasm - stop these ad let's keep an eye on it   You may need to stretch /consider yoga video  I want you to try magnesium 250 mg twice daily (if diarrhea cut it back)   Let's try buspar at 5 mg  Let me know how it goes

## 2018-03-19 NOTE — Progress Notes (Signed)
Subjective:    Patient ID: Carolyn Matthews, female    DOB: Jun 30, 1955, 63 y.o.   MRN: [id number]  HPI  Here for f/u of chronic medical problems   In general doing pretty good   Noticed when she ate sugar (raw candy)  Not exertional  Just over epigastric area  Lasted about an hour  No radiation No nausea  Made her anxious  ? gerd or esophageal spasm  Happened twice with sugar intake   Also more leg cramps lately (any time)  Takes vitamin D for her bones Also takes B12 -small dose  Does take magnesium - just a little    Wt Readings from Last 3 Encounters:  03/19/18 177 lb 8 oz (80.5 kg)  12/02/17 170 lb 8 oz (77.3 kg)  03/05/17 171 lb (77.6 kg)  not a lot of exercise due to time /caregiving 31.19 kg/m    BP Readings from Last 3 Encounters:  03/19/18 (!) 146/78  12/02/17 128/80  03/05/17 (!) 150/84  2nd check   Last visit started buspar 15 mg bid for her anxiety Offered counseling Stress-caring for mother with dementia   She used to take it at 7.5- was fine-now it makes her feel weird (hard to describe- a little loopy)  Then cut to 1/3 dose    Prediabetes Lab Results  Component Value Date   HGBA1C 6.1 12/02/2017  noted the reaction to sugar   Lab Results  Component Value Date   CHOL 191 12/02/2017   HDL 53.20 12/02/2017   LDLCALC 124 (H) 12/02/2017   TRIG 66.0 12/02/2017   CHOLHDL 4 12/02/2017   Not open to cholesterol medication   Patient Active Problem List   Diagnosis Date Noted  . Leg cramps 03/19/2018  . GERD (gastroesophageal reflux disease) 03/19/2018  . Lumbar degenerative disc disease 12/08/2016  . Lumbar back pain with radiculopathy affecting left lower extremity 12/02/2016  . Patellar subluxation, left, initial encounter 06/23/2016  . Vitamin D deficiency 10/03/2015  . Encounter for vitamin deficiency screening 11/14/2014  . Cervical lymphadenitis 05/26/2014  . Thoracic back pain 01/13/2014  . Colon cancer screening 08/11/2012  .  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 Anxiety disorder 02/26/2011  . 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 Other screening mammogram 08/28/2010  . Routine general medical examination at a health care facility 05/23/2010  . MVP (mitral valve prolapse)   . Chronic headaches   . Seasonal allergies   . Family history of breast cancer in female   . Hyperglycemia 04/04/2009  . Hyperlipidemia 11/22/2008  . Anemia, unspecified 02/12/2007  . OVERACTIVE BLADDER 01/11/2007  . FIBROCYSTIC BREAST DISEASE 01/11/2007   Past Medical History:  Diagnosis Date  . Allergy    seasonal  . Anemia   . Chronic headaches   . Family history of breast cancer in female    Daughter  . Fibrocystic breast disease   . GERD (gastroesophageal reflux disease)   . Hyperglycemia    Diet controlled  . MVP (mitral valve prolapse)   . Seasonal allergies   . 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 ANKLE FRACTURE SURGERY  05/2003  . BREAST BIOPSY  03/2004   Negative  . BREAST CYST ASPIRATION  05/2007   Bilateral-benign  . BREAST LUMPECTOMY  12/1977  . TOTAL ABDOMINAL HYSTERECTOMY  09/1996   Fibroids   Social History   Tobacco Use  . Smoking status: Never Smoker  . Smokeless  tobacco: Never Used  Substance Use Topics  . Alcohol use: Yes    Alcohol/week: 0.0 standard drinks    Comment: occasional use  . Drug use: No   Family History  Problem Relation Age of Onset  . Diabetes Father   . Hypertension Mother   . Breast cancer Daughter        (inflammatory)-now has brain tumor  . Diabetes Paternal Grandmother   . Breast cancer Unknown        great aunt  . Breast cancer Daughter        2nd daughter with breast cancer   . Colon cancer Neg Hx   . Rectal cancer Neg Hx   . Stomach cancer Neg Hx   . Esophageal cancer Neg Hx    Allergies  Allergen Reactions  . Eggs Or Egg-Derived Products Other (See Comments)    REACTION: SEVERE REACTION PV  4-22 pt states she gets flu like symptoms all but fever, no anaphylasis  . Hepatitis A Vaccine Other (See Comments)    ? Allergic reaction; itching,weakness almost immediate after taking 1st vaccine   Current Outpatient Medications on File Prior to Visit  Medication Sig Dispense Refill  . cetirizine (ZYRTEC) 10 MG tablet Take 10 mg by mouth daily as needed.      . Multiple Vitamin (MULTIVITAMIN) tablet Take 1 tablet by mouth daily.      Marland Kitchen. omeprazole (PRILOSEC) 40 MG capsule Take 1 capsule (40 mg total) by mouth daily as needed. 90 capsule 3  . pseudoephedrine (SUDAFED) 120 MG 12 hr tablet Take 1 tablet (120 mg total) by mouth every 12 (twelve) hours as needed for congestion. 180 tablet 2  . mometasone (NASONEX) 50 MCG/ACT nasal spray Place 2 sprays into the nose daily. 2 sprays in each nostril per day as needed 17 g 3   No current facility-administered medications on file prior to visit.      Review of Systems  Constitutional: Positive for fatigue. Negative for activity change, appetite change, fever and unexpected weight change.  HENT: Negative for congestion, ear pain, rhinorrhea, sinus pressure, sore throat and voice change.   Eyes: Negative for pain, redness and visual disturbance.  Respiratory: Negative for cough, chest tightness, shortness of breath, wheezing and stridor.   Cardiovascular: Positive for chest pain. Negative for palpitations and leg swelling.  Gastrointestinal: Negative for abdominal pain, blood in stool, constipation, diarrhea, nausea and vomiting.       ? Epigastric discomofort with cp  Endocrine: Negative for polydipsia and polyuria.  Genitourinary: Negative for dysuria, frequency and urgency.  Musculoskeletal: Negative for arthralgias, back pain and myalgias.       Leg cramps   Skin: Negative for pallor and rash.  Allergic/Immunologic: Negative for environmental allergies.  Neurological: Negative for dizziness, syncope and headaches.  Hematological: Negative for  adenopathy. Does not bruise/bleed easily.  Psychiatric/Behavioral: Negative for decreased concentration and dysphoric mood. The patient is nervous/anxious.        Stressors  Fatigued        Objective:   Physical Exam Constitutional:      General: She is not in acute distress.    Appearance: Normal appearance. She is obese. She is not ill-appearing.  HENT:     Head: Normocephalic and atraumatic.     Nose: Nose normal.     Mouth/Throat:     Mouth: Mucous membranes are moist.     Pharynx: No oropharyngeal exudate or posterior oropharyngeal erythema.  Eyes:  General: No scleral icterus.    Conjunctiva/sclera: Conjunctivae normal.     Pupils: Pupils are equal, round, and reactive to light.  Neck:     Musculoskeletal: Normal range of motion. No neck rigidity.     Vascular: No carotid bruit.  Cardiovascular:     Rate and Rhythm: Normal rate and regular rhythm.     Pulses: Normal pulses.     Heart sounds: Normal heart sounds.  Pulmonary:     Effort: Pulmonary effort is normal. No respiratory distress.     Breath sounds: Normal breath sounds. No stridor. No wheezing, rhonchi or rales.  Chest:     Chest wall: No tenderness.  Abdominal:     General: Abdomen is flat. Bowel sounds are normal. There is no distension.     Palpations: Abdomen is soft. There is no mass.     Tenderness: There is no abdominal tenderness. There is no right CVA tenderness or left CVA tenderness.     Hernia: No hernia is present.  Musculoskeletal:        General: No swelling, tenderness or deformity.     Right lower leg: No edema.     Left lower leg: No edema.  Lymphadenopathy:     Cervical: No cervical adenopathy.  Skin:    General: Skin is warm and dry.     Capillary Refill: Capillary refill takes less than 2 seconds.     Coloration: Skin is not pale.     Findings: No erythema or rash.  Neurological:     General: No focal deficit present.     Mental Status: She is alert.     Coordination:  Coordination normal.     Deep Tendon Reflexes: Reflexes normal.  Psychiatric:        Mood and Affect: Mood normal.     Comments: Seems generally fatigued  Pleasant  Mentally sharp  Not overly anxious            Assessment & Plan:   Problem List Items Addressed This Visit      Digestive   GERD (gastroesophageal reflux disease)    Suspect this is the cause of chest discomfort after eating different foods (sweets/sticky)  Continues omeprazole 40 mg  Disc diet She will try to eliminate the foods that cause it and alert [place] if symptoms worsen or fail to improve Watch for dysphagia  Disc poss of esoph spasm as well          Other   Hyperglycemia    Lab Results  Component Value Date   HGBA1C 6.1 12/02/2017   disc imp of low glycemic diet and wt loss to prevent DM2  Sweets are worsening GERD symptoms as well       Anxiety disorder - Primary    Pt is less tolerant of current dose of buspar (felt loopy) Will decrease to 5 mg bid Reviewed stressors/ coping techniques/symptoms/ support sources/ tx options and side effects in detail today Many stressors/caring for mother with dementia Offered counseling if she changes her mind Enc self care (not a lot of time for exercise unfortunately)      Relevant Medications   busPIRone (BUSPAR) 5 MG tablet   Leg cramps    Can happen am or pm (worse pm)  Disc imp of stretching (she does not have time for exercise) Taking vit D and B12 Suggested inc magnesium to 250 bid if tolerated (aware it may cause diarrhea)  Last chemistry labs reviewed  Update if not  starting to improve in a week or if worsening        Chest discomfort    Happens immediately when eating sweets (esp sticky) with sharp pain in mid to low chest  Suspect reflux or esoph spasm  Continues ppi Disc avoidance of foods that bother her No exertional symptoms inst to alert us if worse or no improvement

## 2018-03-21 DIAGNOSIS — R0789 Other chest pain: Secondary | ICD-10-CM | POA: Insufficient documentation

## 2018-03-21 NOTE — Assessment & Plan Note (Signed)
Happens immediately when eating sweets (esp sticky) with sharp pain in mid to low chest  Suspect reflux or esoph spasm  Continues ppi Disc avoidance of foods that bother her No exertional symptoms inst to alert [place] if worse or no improvement

## 2018-03-21 NOTE — Assessment & Plan Note (Signed)
Pt is less tolerant of current dose of buspar (felt loopy) Will decrease to 5 mg bid Reviewed stressors/ coping techniques/symptoms/ support sources/ tx options and side effects in detail today Many stressors/caring for mother with dementia Offered counseling if she changes her mind Enc self care (not a lot of time for exercise unfortunately)

## 2018-03-21 NOTE — Assessment & Plan Note (Signed)
Lab Results  Component Value Date   HGBA1C 6.1 12/02/2017   disc imp of low glycemic diet and wt loss to prevent DM2  Sweets are worsening GERD symptoms as well

## 2018-03-21 NOTE — Assessment & Plan Note (Signed)
Can happen am or pm (worse pm)  Disc imp of stretching (she does not have time for exercise) Taking vit D and B12 Suggested inc magnesium to 250 bid if tolerated (aware it may cause diarrhea)  Last chemistry labs reviewed  Update if not starting to improve in a week or if worsening

## 2018-03-21 NOTE — Assessment & Plan Note (Signed)
Suspect this is the cause of chest discomfort after eating different foods (sweets/sticky)  Continues omeprazole 40 mg  Disc diet She will try to eliminate the foods that cause it and alert [place] if symptoms worsen or fail to improve Watch for dysphagia  Disc poss of esoph spasm as well

## 2018-03-26 ENCOUNTER — Encounter: Payer: Self-pay | Admitting: Family Medicine

## 2018-03-29 MED ORDER — TRAMADOL HCL 50 MG PO TABS: 50.0000 mg | ORAL_TABLET | Freq: Three times a day (TID) | ORAL | 0 refills | Status: DC | PRN

## 2018-03-30 MED FILL — traMADol HCL 50 MG TABS: 50 | 3 days supply | Qty: 9 | Fill #0

## 2018-05-06 ENCOUNTER — Encounter: Payer: Self-pay | Admitting: Family Medicine

## 2018-05-09 MED ORDER — TRAMADOL HCL 50 MG PO TABS: 50.0000 mg | ORAL_TABLET | Freq: Three times a day (TID) | ORAL | 0 refills | Status: DC | PRN

## 2018-05-09 MED ORDER — SERTRALINE HCL 50 MG PO TABS: 25.0000 mg | ORAL_TABLET | Freq: Every day | ORAL | 5 refills | Status: DC

## 2018-06-13 IMAGING — MG 2D DIGITAL SCREENING BILATERAL MAMMOGRAM WITH CAD AND ADJUNCT TO
8 of 12 series · 8 of 28 positions shown · non-contrast
Comparison: Previous exam(s).

CLINICAL DATA: Screening.

EXAM:
2D DIGITAL SCREENING BILATERAL MAMMOGRAM WITH CAD AND ADJUNCT TOMO

[L MLO synth-2D]
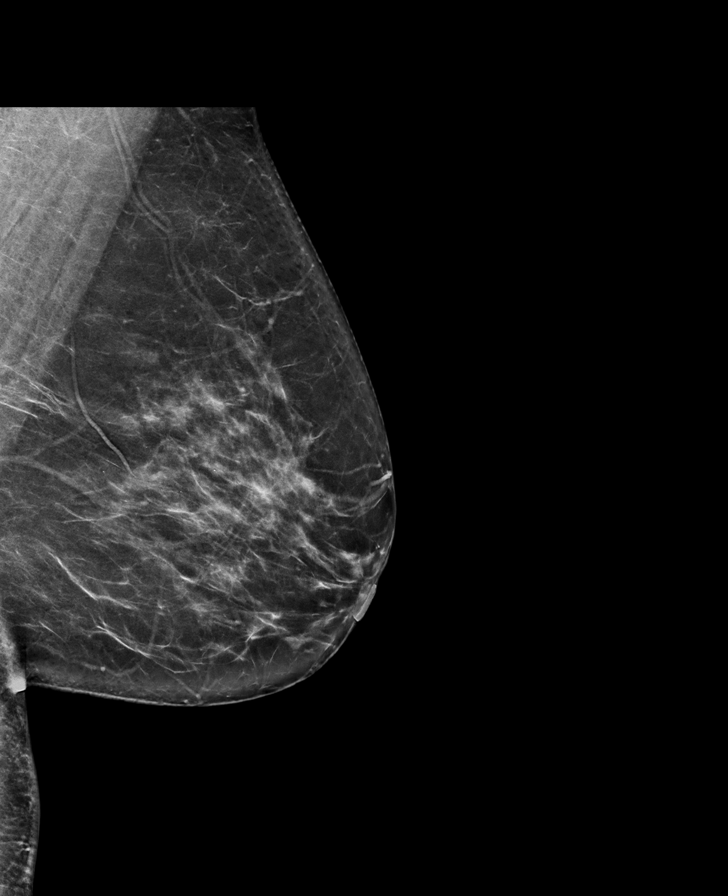

[L CC synth-2D]
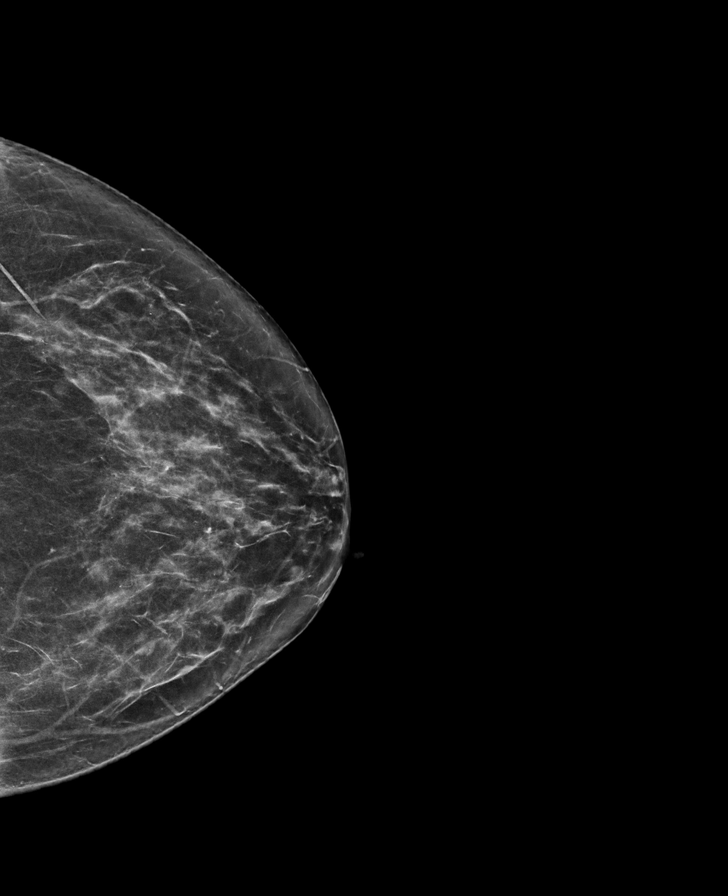

[R CC synth-2D]
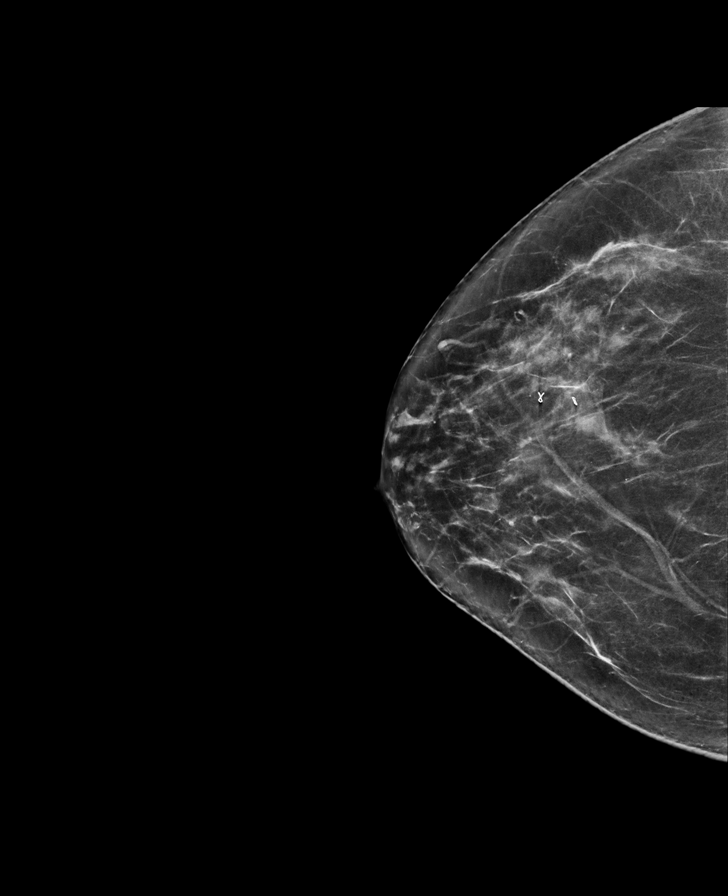

[R MLO synth-2D]
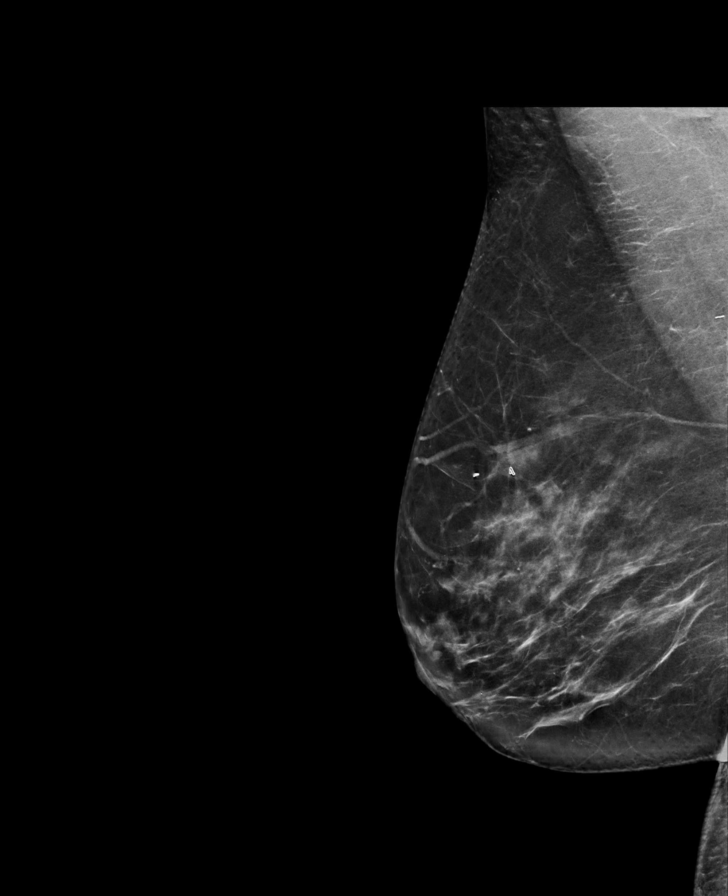

[R MLO]
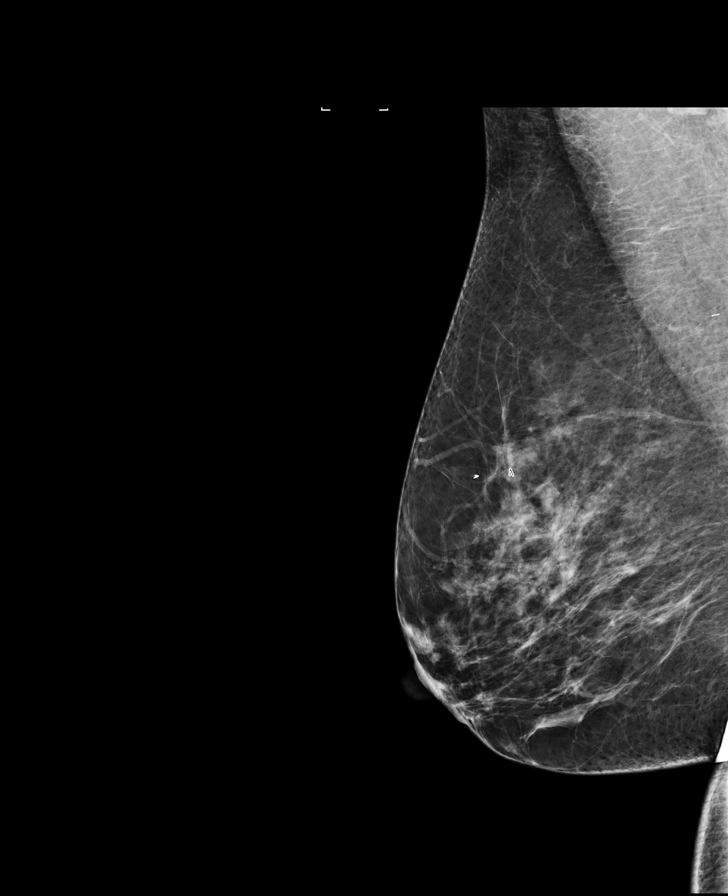

[R CC]
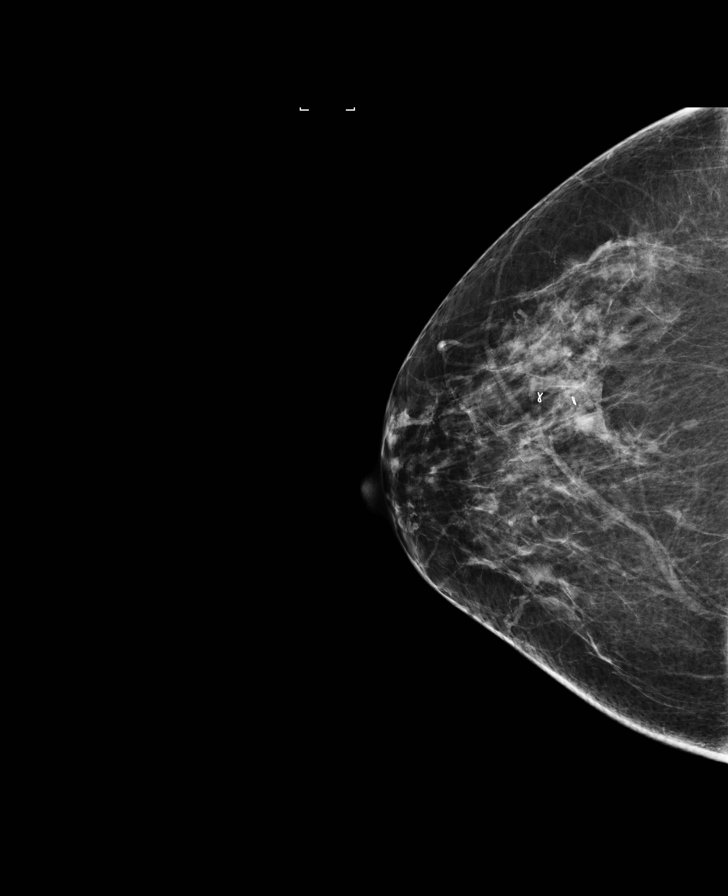

[L MLO]
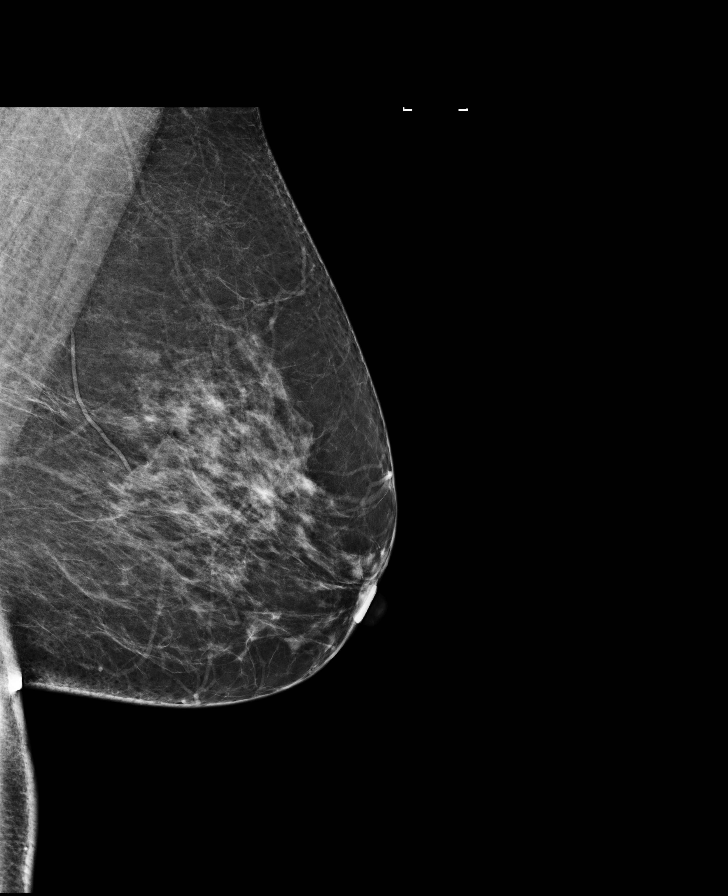

[L CC]
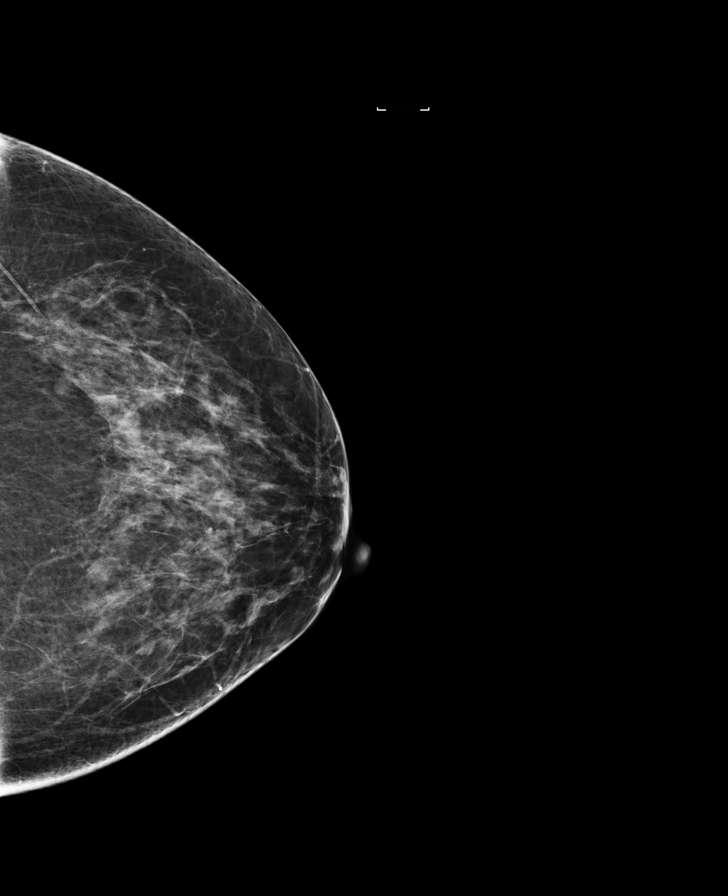

[8 of 28 positions shown; findings below may reference images not displayed]

ACR Breast Density Category c: The breast tissue is heterogeneously
dense, which may obscure small masses.
FINDINGS: There are no findings suspicious for malignancy. Images were
processed with CAD.
IMPRESSION: No mammographic evidence of malignancy. A result letter of this
screening mammogram will be mailed directly to the patient.

RECOMMENDATION:
Screening mammogram in one year. (Code:[id number])

BI-RADS CATEGORY  1: Negative.

## 2018-06-16 ENCOUNTER — Other Ambulatory Visit: Payer: Self-pay | Admitting: Family Medicine

## 2018-06-16 MED FILL — SM NASAL DECONGEST ER 120 M: 120 | 90 days supply | Qty: 180 | Fill #0

## 2018-07-06 ENCOUNTER — Other Ambulatory Visit: Payer: Self-pay | Admitting: Family Medicine

## 2018-07-06 MED ORDER — MOMETASONE FUROATE 50 MCG/ACT NA SUSP: 2.0000 | Freq: Every day | NASAL | 5 refills | Status: DC

## 2018-07-06 MED FILL — MOMETASONE FUROATE 50 MCG S: 50 | 30 days supply | Qty: 17 | Fill #0

## 2018-07-15 ENCOUNTER — Other Ambulatory Visit: Payer: Self-pay | Admitting: Family Medicine

## 2018-07-15 MED FILL — traMADol HCL 50 MG TABS: 50 | 4 days supply | Qty: 15 | Fill #0

## 2018-07-15 NOTE — Telephone Encounter (Signed)
Filled on 05/09/18 #10 tabs with 0 refills, CPE scheduled 12/10/18

## 2018-09-02 ENCOUNTER — Encounter: Payer: Self-pay | Admitting: Family Medicine

## 2018-09-02 MED ORDER — FLUTICASONE PROPIONATE 50 MCG/ACT NA SUSP: 2.0000 | Freq: Every day | NASAL | 3 refills | Status: DC

## 2018-09-02 MED FILL — OMEPRAZOLE 40 MG CPDR: 40 | 90 days supply | Qty: 90 | Fill #1

## 2018-09-03 MED FILL — FLUTICASONE PROP 50 MCG SPR: 50 | 90 days supply | Qty: 48 | Fill #0

## 2018-09-07 ENCOUNTER — Encounter: Payer: Self-pay | Admitting: Family Medicine

## 2018-09-08 MED ORDER — TRAMADOL HCL 50 MG PO TABS: ORAL_TABLET | ORAL | 0 refills | Status: DC

## 2018-09-08 MED FILL — traMADol HCL 50 MG TABS: 50 | 5 days supply | Qty: 15 | Fill #0

## 2018-09-08 NOTE — Telephone Encounter (Signed)
Last refilled on 07/15/2018 #15 with 0 refill. LOV 03/19/2018. Future appointment 12/10/2018

## 2018-09-24 MED FILL — SM NASAL DECONGEST ER 120 M: 120 | 90 days supply | Qty: 180 | Fill #1

## 2018-11-10 ENCOUNTER — Other Ambulatory Visit: Payer: Self-pay | Admitting: Family Medicine

## 2018-11-10 DIAGNOSIS — Z1231 Encounter for screening mammogram for malignant neoplasm of breast: Secondary | ICD-10-CM

## 2018-12-09 ENCOUNTER — Encounter: Payer: Self-pay | Admitting: Family Medicine

## 2018-12-10 ENCOUNTER — Encounter: Payer: 59 | Admitting: Family Medicine

## 2018-12-10 ENCOUNTER — Encounter: Payer: Self-pay | Admitting: Family Medicine

## 2018-12-10 ENCOUNTER — Ambulatory Visit (INDEPENDENT_AMBULATORY_CARE_PROVIDER_SITE_OTHER): Payer: 59 | Admitting: Family Medicine

## 2018-12-10 ENCOUNTER — Other Ambulatory Visit: Payer: Self-pay

## 2018-12-10 VITALS — BP 138/70 | HR 82 | Temp 96.8°F | Ht 63.5 in | Wt 177.2 lb

## 2018-12-10 DIAGNOSIS — M5416 Radiculopathy, lumbar region: Secondary | ICD-10-CM

## 2018-12-10 DIAGNOSIS — D649 Anemia, unspecified: Secondary | ICD-10-CM | POA: Diagnosis not present

## 2018-12-10 DIAGNOSIS — Z0000 Encounter for general adult medical examination without abnormal findings: Secondary | ICD-10-CM

## 2018-12-10 DIAGNOSIS — R7303 Prediabetes: Secondary | ICD-10-CM | POA: Diagnosis not present

## 2018-12-10 DIAGNOSIS — E559 Vitamin D deficiency, unspecified: Secondary | ICD-10-CM | POA: Diagnosis not present

## 2018-12-10 DIAGNOSIS — F419 Anxiety disorder, unspecified: Secondary | ICD-10-CM | POA: Diagnosis not present

## 2018-12-10 DIAGNOSIS — J302 Other seasonal allergic rhinitis: Secondary | ICD-10-CM

## 2018-12-10 DIAGNOSIS — R252 Cramp and spasm: Secondary | ICD-10-CM

## 2018-12-10 DIAGNOSIS — R0789 Other chest pain: Secondary | ICD-10-CM

## 2018-12-10 LAB — LIPID PANEL
Cholesterol: 203 mg/dL — ABNORMAL HIGH (ref 0–200)
HDL: 49.6 mg/dL (ref >39.00)
LDL Cholesterol: 138 mg/dL — ABNORMAL HIGH (ref 0–99)
NonHDL: 153.89
Total CHOL/HDL Ratio: 4
Triglycerides: 79 mg/dL (ref 0.0–149.0)
VLDL: 15.8 mg/dL (ref 0.0–40.0)

## 2018-12-10 LAB — COMPREHENSIVE METABOLIC PANEL
ALT: 11 U/L (ref 0–35)
AST: 14 U/L (ref 0–37)
Albumin: 4.5 g/dL (ref 3.5–5.2)
Alkaline Phosphatase: 88 U/L (ref 39–117)
BUN: 20 mg/dL (ref 6–23)
CO2: 29 mEq/L (ref 19–32)
Calcium: 9.4 mg/dL (ref 8.4–10.5)
Chloride: 104 mEq/L (ref 96–112)
Creatinine, Ser: 0.82 mg/dL (ref 0.40–1.20)
GFR: 85.5 mL/min (ref >60.00)
Glucose, Bld: 104 mg/dL — ABNORMAL HIGH (ref 70–99)
Potassium: 4.3 mEq/L (ref 3.5–5.1)
Sodium: 141 mEq/L (ref 135–145)
Total Bilirubin: 0.6 mg/dL (ref 0.2–1.2)
Total Protein: 7.3 g/dL (ref 6.0–8.3)

## 2018-12-10 LAB — CBC WITH DIFFERENTIAL/PLATELET
Basophils Absolute: 0 10*3/uL (ref 0.0–0.1)
Basophils Relative: 0.6 % (ref 0.0–3.0)
Eosinophils Absolute: 0.1 10*3/uL (ref 0.0–0.7)
Eosinophils Relative: 1.9 % (ref 0.0–5.0)
HCT: 35 % — ABNORMAL LOW (ref 36.0–46.0)
Hemoglobin: 11.5 g/dL — ABNORMAL LOW (ref 12.0–15.0)
Lymphocytes Relative: 23.7 % (ref 12.0–46.0)
Lymphs Abs: 1.8 10*3/uL (ref 0.7–4.0)
MCHC: 32.9 g/dL (ref 30.0–36.0)
MCV: 92.1 fl (ref 78.0–100.0)
Monocytes Absolute: 0.5 10*3/uL (ref 0.1–1.0)
Monocytes Relative: 6.8 % (ref 3.0–12.0)
Neutro Abs: 5.1 10*3/uL (ref 1.4–7.7)
Neutrophils Relative %: 67 % (ref 43.0–77.0)
Platelets: 379 10*3/uL (ref 150.0–400.0)
RBC: 3.8 Mil/uL — ABNORMAL LOW (ref 3.87–5.11)
RDW: 13.8 % (ref 11.5–15.5)
WBC: 7.6 10*3/uL (ref 4.0–10.5)

## 2018-12-10 LAB — VITAMIN D 25 HYDROXY (VIT D DEFICIENCY, FRACTURES): VITD: 32.67 ng/mL (ref 30.00–100.00)

## 2018-12-10 LAB — TSH: TSH: 2.67 u[IU]/mL (ref 0.35–4.50)

## 2018-12-10 LAB — HEMOGLOBIN A1C: Hgb A1c MFr Bld: 6.2 % (ref 4.6–6.5)

## 2018-12-10 LAB — MAGNESIUM: Magnesium: 2.6 mg/dL — ABNORMAL HIGH (ref 1.5–2.5)

## 2018-12-10 MED ORDER — SERTRALINE HCL 50 MG PO TABS: 25.0000 mg | ORAL_TABLET | Freq: Every day | ORAL | 3 refills | Status: DC

## 2018-12-10 MED FILL — SERTRALINE HCL 50 MG TABLET: 50 | 90 days supply | Qty: 45 | Fill #0

## 2018-12-10 NOTE — Progress Notes (Signed)
Subjective:    Patient ID: Carolyn Matthews, female    DOB: 1955-08-04, 63 y.o.   MRN: [id number]  HPI  Here for health maintenance exam and to review chronic medical problems   Has not been doing well in general-lots of problems lately   Wt Readings from Last 3 Encounters:  12/10/18 177 lb 3 oz (80.4 kg)  03/19/18 177 lb 8 oz (80.5 kg)  12/02/17 170 lb 8 oz (77.3 kg)  has been home during covid  Eating more- is more hungry - possibly from boredom and stress  30.90 kg/m  She has an ab lounger- does not like it  She can stream videos  Has a place to walk in neighborhood  She thinks she can set aside 30 min per day to exercise  Portions are too big and eating some junk food   Husband can benefit from eating better    Had pap 11/05 Has had a hysterectomy   Mammogram 12/19 =[facility] already for dec Strong family h/o breast cancer  Self breast exam - no lumps   Colonoscopy 5/16  Tdap 1/19   Leg cramps are still bad  She has tried magnesium and it helps some  Not stretching   Has a lot of reactions- afraid of a shingles vaccine  Afraid of vaccines in general    Anxiety  When she gets anxiety attacks her chest hurts Buspirone stopped working  Was taking sertraline as needed - did not know it was daily    Prediabetes Lab Results  Component Value Date   HGBA1C 6.1 12/02/2017   Sugar may be higher now  Labs planned today Does eat sweets   BP Readings from Last 3 Encounters:  12/10/18 138/70  03/19/18 (!) 146/78  12/02/17 128/80   Pulse Readings from Last 3 Encounters:  12/10/18 82  03/19/18 82  12/02/17 73    Taking zyrtec and flonase  Sudafed also   Patient Active Problem List   Diagnosis Date Noted  . Chest discomfort 03/21/2018  . Leg cramps 03/19/2018  . GERD (gastroesophageal reflux disease) 03/19/2018  . Lumbar degenerative disc disease 12/08/2016  . Lumbar back pain with radiculopathy affecting left lower extremity 12/02/2016  .  Patellar subluxation, left, initial encounter 06/23/2016  . Vitamin D deficiency 10/03/2015  . Encounter for vitamin deficiency screening 11/14/2014  . Cervical lymphadenitis 05/26/2014  . Thoracic back pain 01/13/2014  . Colon cancer screening 08/11/2012  . 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 Anxiety disorder 02/26/2011  . 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 Other screening mammogram 08/28/2010  . Routine general medical examination at a health care facility 05/23/2010  . MVP (mitral valve prolapse)   . Chronic headaches   . Seasonal allergies   . Family history of breast cancer in female   . Prediabetes 04/04/2009  . Hyperlipidemia 11/22/2008  . Anemia, unspecified 02/12/2007  . OVERACTIVE BLADDER 01/11/2007  . FIBROCYSTIC BREAST DISEASE 01/11/2007   Past Medical History:  Diagnosis Date  . Allergy    seasonal  . Anemia   . Chronic headaches   . Family history of breast cancer in female    Daughter  . Fibrocystic breast disease   . GERD (gastroesophageal reflux disease)   . Hyperglycemia    Diet controlled  . MVP (mitral valve prolapse)   . Seasonal allergies   . 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 ANKLE FRACTURE SURGERY  05/2003  .  BREAST BIOPSY  03/2004   Negative  . BREAST CYST ASPIRATION  05/2007   Bilateral-benign  . BREAST LUMPECTOMY  12/1977  . TOTAL ABDOMINAL HYSTERECTOMY  09/1996   Fibroids   Social History   Tobacco Use  . Smoking status: Never Smoker  . Smokeless tobacco: Never Used  Substance Use Topics  . Alcohol use: Yes    Alcohol/week: 0.0 standard drinks    Comment: occasional use  . Drug use: No   Family History  Problem Relation Age of Onset  . Diabetes Father   . Hypertension Mother   . Breast cancer Daughter        (inflammatory)-now has brain tumor  . Diabetes Paternal Grandmother   . Breast cancer Unknown        great aunt  . Breast cancer Daughter        2nd  daughter with breast cancer   . Colon cancer Neg Hx   . Rectal cancer Neg Hx   . Stomach cancer Neg Hx   . Esophageal cancer Neg Hx    Allergies  Allergen Reactions  . Eggs Or Egg-Derived Products Other (See Comments)    REACTION: SEVERE REACTION PV 4-22 pt states she gets flu like symptoms all but fever, no anaphylasis  . Hepatitis A Vaccine Other (See Comments)    ? Allergic reaction; itching,weakness almost immediate after taking 1st vaccine   Current Outpatient Medications on File Prior to Visit  Medication Sig Dispense Refill  . cetirizine (ZYRTEC) 10 MG tablet Take 10 mg by mouth daily as needed.      . fluticasone (FLONASE) 50 MCG/ACT nasal spray Place 2 sprays into both nostrils daily. 48 g 3  . Multiple Vitamin (MULTIVITAMIN) tablet Take 1 tablet by mouth daily.      Marland Kitchen. omeprazole (PRILOSEC) 40 MG capsule Take 1 capsule (40 mg total) by mouth daily as needed. 90 capsule 3  . traMADol (ULTRAM) 50 MG tablet TAKE 1 TABLET BY MOUTH EVERY 8 HOURS AS NEEDED FOR MODERATE OR SEVERE PAIN 15 tablet 0   No current facility-administered medications on file prior to visit.       Review of Systems  Constitutional: Positive for fatigue. Negative for activity change, appetite change, fever and unexpected weight change.  HENT: Positive for congestion, postnasal drip, rhinorrhea and sneezing. Negative for ear pain, sinus pressure and sore throat.        Seasonal allergies  Eyes: Negative for pain, redness and visual disturbance.  Respiratory: Negative for cough, shortness of breath and wheezing.   Cardiovascular: Negative for chest pain and palpitations.  Gastrointestinal: Negative for abdominal pain, blood in stool, constipation and diarrhea.  Endocrine: Negative for polydipsia, polyphagia and polyuria.  Genitourinary: Negative for dysuria, frequency and urgency.  Musculoskeletal: Positive for back pain. Negative for arthralgias and myalgias.       Muscle cramping   Skin: Negative for  pallor and rash.  Allergic/Immunologic: Negative for environmental allergies.  Neurological: Negative for dizziness, syncope and headaches.  Hematological: Negative for adenopathy. Does not bruise/bleed easily.  Psychiatric/Behavioral: Negative for confusion, decreased concentration and dysphoric mood. The patient is nervous/anxious.        Objective:   Physical Exam Constitutional:      General: She is not in acute distress.    Appearance: Normal appearance. She is well-developed. She is obese. She is not ill-appearing or diaphoretic.  HENT:     Head: Normocephalic and atraumatic.     Right Ear: Tympanic membrane, ear  canal and external ear normal.     Left Ear: Tympanic membrane, ear canal and external ear normal.     Nose: Nose normal. No congestion.     Mouth/Throat:     Mouth: Mucous membranes are moist.     Pharynx: Oropharynx is clear. No posterior oropharyngeal erythema.  Eyes:     General: No scleral icterus.    Extraocular Movements: Extraocular movements intact.     Conjunctiva/sclera: Conjunctivae normal.     Pupils: Pupils are equal, round, and reactive to light.  Neck:     Musculoskeletal: Normal range of motion and neck supple. No neck rigidity or muscular tenderness.     Thyroid: No thyromegaly.     Vascular: No carotid bruit or JVD.  Cardiovascular:     Rate and Rhythm: Normal rate and regular rhythm.     Pulses: Normal pulses.     Heart sounds: Normal heart sounds. No gallop.   Pulmonary:     Effort: Pulmonary effort is normal. No respiratory distress.     Breath sounds: Normal breath sounds. No wheezing.     Comments: Good air exch Chest:     Chest wall: No tenderness.  Abdominal:     General: Bowel sounds are normal. There is no distension or abdominal bruit.     Palpations: Abdomen is soft. There is no mass.     Tenderness: There is no abdominal tenderness.     Hernia: No hernia is present.  Genitourinary:    Comments: Breast exam: No mass, nodules,  thickening, tenderness, bulging, retraction, inflamation, nipple discharge or skin changes noted.  No axillary or clavicular LA.     Musculoskeletal: Normal range of motion.        General: No tenderness.     Right lower leg: No edema.     Left lower leg: No edema.  Lymphadenopathy:     Cervical: No cervical adenopathy.  Skin:    General: Skin is warm and dry.     Coloration: Skin is not pale.     Findings: No erythema or rash.  Neurological:     Mental Status: She is alert. Mental status is at baseline.     Cranial Nerves: No cranial nerve deficit.     Motor: No abnormal muscle tone.     Coordination: Coordination normal.     Gait: Gait normal.     Deep Tendon Reflexes: Reflexes are normal and symmetric.  Psychiatric:        Mood and Affect: Mood is anxious.        Cognition and Memory: Cognition normal.     Comments: Mildly anxious  Candidly discusses stressors  Also her fear of vaccines           Assessment & Plan:   Problem List Items Addressed This Visit      Nervous and Auditory   Lumbar back pain with radiculopathy affecting left lower extremity   Relevant Medications   sertraline (ZOLOFT) 50 MG tablet     Other   Anemia, unspecified    Labs today      Prediabetes    A1c today  Diet and exercise habits are not as good lately disc imp of low glycemic diet and wt loss to prevent DM2       Relevant Orders   Hemoglobin A1c (Completed)   Seasonal allergies   Routine general medical examination at a health care facility - Primary    Reviewed health habits including diet and  exercise and skin cancer prevention Reviewed appropriate screening tests for age  Also reviewed health mt list, fam hx and immunization status , as well as social and family history   See HPI Labs today  Pt would benefit from better diet and exercise  Mammogram is already scheduled for December (strong fam hx of breast cancer)      Relevant Orders   CBC w/Diff (Completed)    Comprehensive metabolic panel (Completed)   Lipid panel (Completed)   TSH (Completed)   Anxiety disorder    Worse lately with stressors Also has not been taking sertraline prn instead of daily  Explained to pt that it needs to be taken daily on a schedule for it to work well  Disc need for self care  Reviewed stressors/ coping techniques/symptoms/ support sources/ tx options and side effects in detail today Will make effort to exercise and get outdoors   F/u 2 wk      Relevant Medications   sertraline (ZOLOFT) 50 MG tablet   Vitamin D deficiency    D level with labs today  Disc imp to bone and overall health      Relevant Orders   VITAMIN D 25 Hydroxy (Vit-D Deficiency, Fractures) (Completed)   Leg cramps    Ongoing Some imp with magnesium  Disc imp of regular exercise and stretching Lab today including magnesium level      Relevant Orders   Magnesium (Completed)   Chest discomfort    This occurs with anxiety  Stressed imp of taking sertraline daily instead of prn Will address further at f/u

## 2018-12-10 NOTE — Patient Instructions (Addendum)
Set aside 30 minutes of time each day to exercise - more if possible  Work on cutting portions by 1/3 - you will find you get full faster after a week or so  Get junk food out of the house   Try to get most of your carbohydrates from produce (with the exception of white potatoes)  Eat less bread/pasta/rice/snack foods/cereals/sweets and other items from the middle of the grocery store (processed carbs)   Stretching may help cramps/ program like beginner yoga may help   Take your sertraline every day on a schedule at the same time   Continue zyrtec  Use flonase every day instead of as needed  Sudafed may be making anxiety worse

## 2018-12-12 NOTE — Assessment & Plan Note (Signed)
Reviewed health habits including diet and exercise and skin cancer prevention Reviewed appropriate screening tests for age  Also reviewed health mt list, fam hx and immunization status , as well as social and family history   See HPI Labs today  Pt would benefit from better diet and exercise  Mammogram is already scheduled for December (strong fam hx of breast cancer)

## 2018-12-12 NOTE — Assessment & Plan Note (Signed)
A1c today  Diet and exercise habits are not as good lately disc imp of low glycemic diet and wt loss to prevent DM2

## 2018-12-12 NOTE — Assessment & Plan Note (Signed)
D level with labs today Disc imp to bone and overall health  

## 2018-12-12 NOTE — Assessment & Plan Note (Signed)
This occurs with anxiety  Stressed imp of taking sertraline daily instead of prn Will address further at f/u

## 2018-12-12 NOTE — Assessment & Plan Note (Signed)
Ongoing Some imp with magnesium  Disc imp of regular exercise and stretching Lab today including magnesium level

## 2018-12-12 NOTE — Assessment & Plan Note (Signed)
Worse lately with stressors Also has not been taking sertraline prn instead of daily  Explained to pt that it needs to be taken daily on a schedule for it to work well  Disc need for self care  Reviewed stressors/ coping techniques/symptoms/ support sources/ tx options and side effects in detail today Will make effort to exercise and get outdoors   F/u 2 wk

## 2018-12-12 NOTE — Assessment & Plan Note (Signed)
Labs today

## 2018-12-28 ENCOUNTER — Ambulatory Visit: Payer: 59 | Admitting: Family Medicine

## 2019-01-04 ENCOUNTER — Ambulatory Visit: Payer: 59 | Admitting: Family Medicine

## 2019-01-07 ENCOUNTER — Encounter: Payer: Self-pay | Admitting: Family Medicine

## 2019-01-07 ENCOUNTER — Other Ambulatory Visit: Payer: Self-pay | Admitting: Family Medicine

## 2019-01-07 MED ORDER — TRAMADOL HCL 50 MG PO TABS: ORAL_TABLET | ORAL | 0 refills | Status: DC

## 2019-01-07 MED FILL — traMADol HCL 50 MG TABS: 50 | 5 days supply | Qty: 15 | Fill #0

## 2019-01-07 MED FILL — PSEUDOEPHEDRINE HCL ER 120: 120 | 90 days supply | Qty: 180 | Fill #0

## 2019-01-07 NOTE — Telephone Encounter (Signed)
Please advise, not on med list.  

## 2019-01-18 ENCOUNTER — Ambulatory Visit
Admission: RE | Admit: 2019-01-18 | Discharge: 2019-01-18 | Disposition: A | Discharge status: Home / Self Care | Payer: 59 | Source: Ambulatory Visit | Attending: Family Medicine | Admitting: Family Medicine

## 2019-01-18 ENCOUNTER — Other Ambulatory Visit: Payer: Self-pay

## 2019-01-18 DIAGNOSIS — Z1231 Encounter for screening mammogram for malignant neoplasm of breast: Secondary | ICD-10-CM

## 2019-02-07 ENCOUNTER — Encounter: Payer: Self-pay | Admitting: Family Medicine

## 2019-02-08 ENCOUNTER — Ambulatory Visit: Payer: 59 | Admitting: Family Medicine

## 2019-02-08 MED ORDER — FLUTICASONE PROPIONATE 50 MCG/ACT NA SUSP: 2.0000 | Freq: Every day | NASAL | 1 refills | Status: DC

## 2019-02-08 MED FILL — FLUTICASONE PROP 50 MCG SPR: 50 | 90 days supply | Qty: 48 | Fill #0

## 2019-02-14 ENCOUNTER — Encounter: Payer: Self-pay | Admitting: Family Medicine

## 2019-02-15 ENCOUNTER — Telehealth: Payer: Self-pay

## 2019-02-15 ENCOUNTER — Encounter: Payer: Self-pay | Admitting: Family Medicine

## 2019-02-15 ENCOUNTER — Ambulatory Visit (INDEPENDENT_AMBULATORY_CARE_PROVIDER_SITE_OTHER): Payer: No Typology Code available for payment source | Admitting: Family Medicine

## 2019-02-15 DIAGNOSIS — M5136 Other intervertebral disc degeneration, lumbar region: Secondary | ICD-10-CM

## 2019-02-15 DIAGNOSIS — R252 Cramp and spasm: Secondary | ICD-10-CM | POA: Diagnosis not present

## 2019-02-15 DIAGNOSIS — F411 Generalized anxiety disorder: Secondary | ICD-10-CM | POA: Diagnosis not present

## 2019-02-15 NOTE — Patient Instructions (Signed)
No changes in medication Glad you are doing better  Continue the sertraline daily  Start some exercise if you can  The office will call re: the sport med referral

## 2019-02-15 NOTE — Assessment & Plan Note (Signed)
Magnesium level (just over ref range) reviewed  She has started using "theraworx" product over the counter (topical/ homeopathic) and it is helping Feels like she may be able to start some exercise now

## 2019-02-15 NOTE — Telephone Encounter (Signed)
Shapale sent patient a my chart message letting her know she'll be calling her before the appointment.

## 2019-02-15 NOTE — Assessment & Plan Note (Signed)
Much improved now that she is taking sertraline daily instead of prn  More relaxed  No more panic attacks or chest discomfort  Reviewed stressors/ coping techniques/symptoms/ support sources/ tx options and side effects in detail today Stress level is down also  Able to practice more self care and get some sleep since her mother moved to her brother's house  Will continue at this dose

## 2019-02-15 NOTE — Assessment & Plan Note (Signed)
Pt req visit with sport med/Dr Ayesha Mohair Ref done  Feels like she can try some stretching now that the muscle cramps are improved

## 2019-02-15 NOTE — Progress Notes (Signed)
Virtual Visit via Video Note  I connected with Carolyn Matthews on 02/15/19 at 10:00 AM EST by a video enabled telemedicine application and verified that I am speaking with the correct person using two identifiers.  Location: Patient: home Provider: office   I discussed the limitations of evaluation and management by telemedicine and the availability of in person appointments. The patient expressed understanding and agreed to proceed.  Parties involved in encounter  Patient: Carolyn Matthews  Provider:  Roxy Manns MD    History of Present Illness: Pt presents for f/u of chronic health problems   Anxiety  Many stressors  Was originally tried on buspar - this made her loopy  We changed to sertraline  Last visit noted she was taking it prn instead of daily-inst to change to daily use   Some dec in stress since her brother took her mother in  She goes over daily to see her  A big relief for her   Much improved with zoloft 50 mg daily -when taking it daily   She still has leg cramps  She bought a product from [employer] and it helps It is called theraworx for muscle cramp and spasm relief - she rubs into her lega and feet  It has magnesium sulfate -and some homeopathic ingredients  Her mag level was just over the normal range last time -taking magnesium otc   No longer any chest symptoms    she still has trouble with lower back pain  Was afraid to exercise due to cramps but thinks she may be able to start some exercise  She still wants to see Ayesha Mohair  Low back pain read to her L leg    (moving that leg in any unusual way makes it worse) H/o deg disc dz ( S1 inv)  Heavy lifting and stress in general make it worse   Patient Active Problem List   Diagnosis Date Noted  . Chest discomfort 03/21/2018  . Leg cramps 03/19/2018  . GERD (gastroesophageal reflux disease) 03/19/2018  . Lumbar degenerative disc disease 12/08/2016  . Lumbar back pain with radiculopathy affecting left  lower extremity 12/02/2016  . Patellar subluxation, left, initial encounter 06/23/2016  . Vitamin D deficiency 10/03/2015  . Encounter for vitamin deficiency screening 11/14/2014  . Thoracic back pain 01/13/2014  . Colon cancer screening 08/11/2012  . 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 Anxiety disorder 02/26/2011  . 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 Other screening mammogram 08/28/2010  . Routine general medical examination at a health care facility 05/23/2010  . MVP (mitral valve prolapse)   . Chronic headaches   . Seasonal allergies   . Family history of breast cancer in female   . Prediabetes 04/04/2009  . Hyperlipidemia 11/22/2008  . Anemia, unspecified 02/12/2007  . OVERACTIVE BLADDER 01/11/2007  . FIBROCYSTIC BREAST DISEASE 01/11/2007   Past Medical History:  Diagnosis Date  . Allergy    seasonal  . Anemia   . Chronic headaches   . Family history of breast cancer in female    Daughter  . Fibrocystic breast disease   . GERD (gastroesophageal reflux disease)   . Hyperglycemia    Diet controlled  . MVP (mitral valve prolapse)   . Seasonal allergies   . 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 ANKLE FRACTURE SURGERY  05/2003  . BREAST BIOPSY  03/2004   Negative  . BREAST CYST ASPIRATION  05/2007  Bilateral-benign  . BREAST LUMPECTOMY  12/1977  . TOTAL ABDOMINAL HYSTERECTOMY  09/1996   Fibroids   Social History   Tobacco Use  . Smoking status: Never Smoker  . Smokeless tobacco: Never Used  Substance Use Topics  . Alcohol use: Yes    Alcohol/week: 0.0 standard drinks    Comment: occasional use  . Drug use: No   Family History  Problem Relation Age of Onset  . Diabetes Father   . Hypertension Mother   . Breast cancer Daughter        (inflammatory)-now has brain tumor  . Diabetes Paternal Grandmother   . Breast cancer Other        great aunt  . Breast cancer Daughter        2nd daughter  with breast cancer   . Colon cancer Neg Hx   . Rectal cancer Neg Hx   . Stomach cancer Neg Hx   . Esophageal cancer Neg Hx    Allergies  Allergen Reactions  . Eggs Or Egg-Derived Products Other (See Comments)    REACTION: SEVERE REACTION PV 4-22 pt states she gets flu like symptoms all but fever, no anaphylasis  . Hepatitis A Vaccine Other (See Comments)    ? Allergic reaction; itching,weakness almost immediate after taking 1st vaccine   Current Outpatient Medications on File Prior to Visit  Medication Sig Dispense Refill  . cetirizine (ZYRTEC) 10 MG tablet Take 10 mg by mouth daily as needed.      . fluticasone (FLONASE) 50 MCG/ACT nasal spray Place 2 sprays into both nostrils daily. 48 g 1  . Multiple Vitamin (MULTIVITAMIN) tablet Take 1 tablet by mouth daily.      Marland Kitchen omeprazole (PRILOSEC) 40 MG capsule Take 1 capsule (40 mg total) by mouth daily as needed. 90 capsule 3  . sertraline (ZOLOFT) 50 MG tablet Take 0.5 tablets (25 mg total) by mouth daily. 45 tablet 3  . SM NASAL DECONGESTANT 120 MG 12 hr tablet TAKE 1 TABLET BY MOUTH EVERY 12 HOURS AS NEEDED FOR CONGESTION 180 tablet 1  . traMADol (ULTRAM) 50 MG tablet TAKE 1 TABLET BY MOUTH EVERY 8 HOURS AS NEEDED FOR MODERATE OR SEVERE PAIN 15 tablet 0   No current facility-administered medications on file prior to visit.   Review of Systems  Constitutional: Negative for chills, fever and malaise/fatigue.  HENT: Negative for congestion, ear pain, sinus pain and sore throat.   Eyes: Negative for blurred vision, discharge and redness.  Respiratory: Negative for cough, shortness of breath and stridor.   Cardiovascular: Negative for chest pain, palpitations and leg swelling.  Gastrointestinal: Negative for abdominal pain, diarrhea, nausea and vomiting.  Musculoskeletal: Positive for back pain. Negative for myalgias.  Skin: Negative for rash.  Neurological: Negative for dizziness and headaches.  Psychiatric/Behavioral: Negative for  depression and memory loss. The patient is nervous/anxious.     Observations/Objective:   Assessment and Plan: Problem List Items Addressed This Visit      Musculoskeletal and Integument   Lumbar degenerative disc disease    Pt req visit with sport med/Dr Ayesha Mohair Ref done  Feels like she can try some stretching now that the muscle cramps are improved       Relevant Orders   Ambulatory referral to Sports Medicine     Other   Anxiety disorder - Primary    Much improved now that she is taking sertraline daily instead of prn  More relaxed  No more panic attacks or  chest discomfort  Reviewed stressors/ coping techniques/symptoms/ support sources/ tx options and side effects in detail today Stress level is down also  Able to practice more self care and get some sleep since her mother moved to her brother's house  Will continue at this dose      Leg cramps    Magnesium level (just over ref range) reviewed  She has started using "theraworx" product over the counter (topical/ homeopathic) and it is helping Feels like she may be able to start some exercise now           Follow Up Instructions: No changes in medication Glad you are doing better  Continue the sertraline daily  Start some exercise if you can  The office will call re: the sport med referral   I discussed the assessment and treatment plan with the patient. The patient was provided an opportunity to ask questions and all were answered. The patient agreed with the plan and demonstrated an understanding of the instructions.   The patient was advised to call back or seek an in-person evaluation if the symptoms worsen or if the condition fails to improve as anticipated.     Loura Pardon, MD

## 2019-02-15 NOTE — Telephone Encounter (Signed)
[facility] Night - Client Nonclinical Telephone Record AccessNurse Client [facility] Night - Client Client Site [facility] - Night Physician Tower, Idamae Schuller - MD Contact Type Call Who Is Calling Patient / Member / Family / Caregiver Caller Name Morrissa Shein Caller Phone Number n/a Patient Name Sharlotte Baka Patient DOB n/a Call Type Message Only Information Provided Reason for Call Request for General Office Information Initial Comment Caller states she has an appointment at 10 and she wants to find out how th eoffice does the virtual appointments. Additional Comment caller will call back when office reopens Disp. Time Disposition Final User 02/15/2019 8:03:25 AM General Information Provided Yes Valli Glance Call Closed By: Valli Glance Transaction Date/Time: 02/15/2019 8:01:23 AM (ET)

## 2019-03-02 ENCOUNTER — Other Ambulatory Visit: Payer: Self-pay

## 2019-03-02 ENCOUNTER — Ambulatory Visit: Payer: No Typology Code available for payment source | Admitting: Family Medicine

## 2019-03-02 DIAGNOSIS — M5136 Other intervertebral disc degeneration, lumbar region: Secondary | ICD-10-CM

## 2019-03-02 MED ORDER — GABAPENTIN 100 MG PO CAPS: 200.0000 mg | ORAL_CAPSULE | Freq: Every day | ORAL | 0 refills | Status: DC

## 2019-03-02 MED FILL — GABAPENTIN 100 MG CAPSULE: 100 | 90 days supply | Qty: 180 | Fill #0

## 2019-03-02 NOTE — Patient Instructions (Addendum)
Good to see you.  Ice 20 minutes 2 times daily. Usually after activity and before bed. Exercises 3 times a week.  Gabapentin 200 mg at night  Turmeric 500mg  daily  Tart cherry extract 1200mg  at night Vitamin D 2000 IU daily  Iron 65 mg daily with 500mg  Vit C See me again in  4 weeks

## 2019-03-02 NOTE — Progress Notes (Signed)
[place] [address] [place] Phone: [phone] Subjective:    I'm seeing this patient by the request  of:  Tower, Wynelle Fanny, MD  CC:   [id number]  09/02/2016 Doing much better overall. Continue conservative therapy. Can follow-up as needed.  Update 03/02/2019 Carolyn Matthews is a 64 y.o. female coming in with complaint of left calf and toe cramping at night as well as lower back pain for 2 years. Has been using Theraworx muscle rub for her pain.  Patient is having more back pain recently as well.  Patient has known arthritic changes.  MRI in 2018 was independently visualized by me in great detail showing that patient did have an left-sided S1 nerve root impingement.  Also closely to have impingement at L3-L4 and L4-L5 as well.  Noticing more pain at night, affecting daily activities,      Past Medical History:  Diagnosis Date  . Allergy    seasonal  . Anemia   . Chronic headaches   . Family history of breast cancer in female    Daughter  . Fibrocystic breast disease   . GERD (gastroesophageal reflux disease)   . Hyperglycemia    Diet controlled  . MVP (mitral valve prolapse)   . Seasonal allergies   . 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 ANKLE FRACTURE SURGERY  05/2003  . BREAST BIOPSY  03/2004   Negative  . BREAST CYST ASPIRATION  05/2007   Bilateral-benign  . BREAST LUMPECTOMY  12/1977  . TOTAL ABDOMINAL HYSTERECTOMY  09/1996   Fibroids   Social History   Socioeconomic History  . Marital status: Married    Spouse name: Not on file  . Number of children: 2  . Years of education: Not on file  . Highest education level: Not on file  Occupational History  . Occupation: Patent attorney: [employer]    Employer: [employer]  Tobacco Use  . Smoking status: Never Smoker  . Smokeless tobacco: Never Used    Substance and Sexual Activity  . Alcohol use: Yes    Alcohol/week: 0.0 standard drinks    Comment: occasional use  . Drug use: No  . Sexual activity: Not on file  Other Topics Concern  . Not on file  Social History Narrative  . Not on file   Social Determinants of Health   Financial Resource Strain:   . Difficulty of Paying Living Expenses: Not on file  Food Insecurity:   . Worried About Charity fundraiser in the Last Year: Not on file  . Ran Out of Food in the Last Year: Not on file  Transportation Needs:   . Lack of Transportation (Medical): Not on file  . Lack of Transportation (Non-Medical): Not on file  Physical Activity:   . Days of Exercise per Week: Not on file  . Minutes of Exercise per Session: Not on file  Stress:   . Feeling of Stress : Not on file  Social Connections:   . Frequency of Communication with Friends and Family: Not on file  . Frequency of Social Gatherings with Friends and Family: Not on file  . Attends Religious Services: Not on file  . Active Member of Clubs or Organizations: Not on file  . Attends Archivist Meetings: Not on file  . Marital Status: Not on file  Allergies  Allergen Reactions  . Eggs Or Egg-Derived Products Other (See Comments)    REACTION: SEVERE REACTION PV 4-22 pt states she gets flu like symptoms all but fever, no anaphylasis  . Hepatitis A Vaccine Other (See Comments)    ? Allergic reaction; itching,weakness almost immediate after taking 1st vaccine   Family History  Problem Relation Age of Onset  . Diabetes Father   . Hypertension Mother   . Breast cancer Daughter        (inflammatory)-now has brain tumor  . Diabetes Paternal Grandmother   . Breast cancer Other        great aunt  . Breast cancer Daughter        2nd daughter with breast cancer   . Colon cancer Neg Hx   . Rectal cancer Neg Hx   . Stomach cancer Neg Hx   . Esophageal cancer Neg Hx       Current Outpatient Medications  (Respiratory):  .  cetirizine (ZYRTEC) 10 MG tablet, Take 10 mg by mouth daily as needed.   .  fluticasone (FLONASE) 50 MCG/ACT nasal spray, Place 2 sprays into both nostrils daily. .  SM NASAL DECONGESTANT 120 MG 12 hr tablet, TAKE 1 TABLET BY MOUTH EVERY 12 HOURS AS NEEDED FOR CONGESTION  Current Outpatient Medications (Analgesics):  .  traMADol (ULTRAM) 50 MG tablet, TAKE 1 TABLET BY MOUTH EVERY 8 HOURS AS NEEDED FOR MODERATE OR SEVERE PAIN   Current Outpatient Medications (Other):  Marland Kitchen  Multiple Vitamin (MULTIVITAMIN) tablet, Take 1 tablet by mouth daily.   Marland Kitchen  omeprazole (PRILOSEC) 40 MG capsule, Take 1 capsule (40 mg total) by mouth daily as needed. .  sertraline (ZOLOFT) 50 MG tablet, Take 0.5 tablets (25 mg total) by mouth daily. Marland Kitchen  gabapentin (NEURONTIN) 100 MG capsule, Take 2 capsules (200 mg total) by mouth at bedtime.   Reviewed prior external information including notes and imaging from  primary care provider As well as notes that were available from care everywhere and other healthcare systems.  Past medical history, social, surgical and family history all reviewed in electronic medical record.  No pertanent information unless stated regarding to the chief complaint.   Review of Systems:  No headache, visual changes, nausea, vomiting, diarrhea, constipation, dizziness, abdominal pain, skin rash, fevers, chills, night sweats, weight loss, swollen lymph nodes, body aches, joint swelling, chest pain, shortness of breath, mood changes. POSITIVE muscle aches  Objective  Blood pressure 108/68, pulse 86, height 5' 3.5" (1.613 m), weight 180 lb (81.6 kg), SpO2 97 %.   General: No apparent distress alert and oriented x3 mood and affect normal, dressed appropriately.  HEENT: Pupils equal, extraocular movements intact  Respiratory: Patient's speak in full sentences and does not appear short of breath  Cardiovascular: No lower extremity edema, non tender, no erythema  Skin: Warm dry  intact with no signs of infection or rash on extremities or on axial skeleton.  Abdomen: Soft nontender  Neuro: Cranial nerves II through XII are intact, neurovascularly intact in all extremities with 2+ DTRs and 2+ pulses.  Lymph: No lymphadenopathy of posterior or anterior cervical chain or axillae bilaterally.  Gait normal with good balance and coordination.  MSK:  Non tender with full range of motion and good stability and symmetric strength and tone of shoulders, elbows, wrist, hip, knee and ankles bilaterally.  Low back exam does have some tenderness to palpation in the paraspinal musculature left greater than right.  Positive Pearlean Brownie but positive  straight leg with radicular symptoms down the left leg at 20 degrees.  Likely no weakness and deep tendon reflexes intact.  Limited range of motion in all planes lacking 5 degrees in flexion in the last 2 degrees of extension  [id number]; 15 additional minutes spent for Therapeutic exercises as stated in above notes.  This included exercises focusing on stretching, strengthening, with significant focus on eccentric aspects.   Long term goals include an improvement in range of motion, strength, endurance as well as avoiding reinjury. Patient's frequency would include in 1-2 times a day, 3-5 times a week for a duration of 6-12 weeks. Low back exercises that included:  Pelvic tilt/bracing instruction to focus on control of the pelvic girdle and lower abdominal muscles  Glute strengthening exercises, focusing on proper firing of the glutes without engaging the low back muscles Proper stretching techniques for maximum relief for the hamstrings, hip flexors, low back and some rotation where tolerated    Proper technique shown and discussed handout in great detail with ATC.  All questions were discussed and answered.     Impression and Recommendations:     This case required medical decision making of moderate complexity. The above documentation has been  reviewed and is accurate and complete Judi Saa, DO       Note: This dictation was prepared with Dragon dictation along with smaller phrase technology. Any transcriptional errors that result from this process are unintentional.

## 2019-03-03 ENCOUNTER — Encounter: Payer: Self-pay | Admitting: Family Medicine

## 2019-03-03 NOTE — Assessment & Plan Note (Signed)
Known degenerative disc with some radicular symptoms going down the left leg, started gabapentin and warned potential side effects, discussed icing regimen and home exercises, hold on formal physical therapy secondary to social determinants as well as coronavirus.  Discussed icing regimen and home exercises.  Follow-up with me again 4 to 6 weeks.

## 2019-04-06 ENCOUNTER — Ambulatory Visit: Payer: No Typology Code available for payment source | Admitting: Family Medicine

## 2019-04-06 ENCOUNTER — Other Ambulatory Visit: Payer: Self-pay | Admitting: Family Medicine

## 2019-04-06 ENCOUNTER — Encounter: Payer: Self-pay | Admitting: Family Medicine

## 2019-04-06 ENCOUNTER — Other Ambulatory Visit: Payer: Self-pay

## 2019-04-06 DIAGNOSIS — M5416 Radiculopathy, lumbar region: Secondary | ICD-10-CM

## 2019-04-06 MED ORDER — GABAPENTIN 300 MG PO CAPS: 300.0000 mg | ORAL_CAPSULE | Freq: Three times a day (TID) | ORAL | 3 refills | Status: DC

## 2019-04-06 MED FILL — GABAPENTIN 300 MG CAPSULE: 300 | 30 days supply | Qty: 90 | Fill #0

## 2019-04-06 NOTE — Patient Instructions (Signed)
300 mg gabapentin Stay active See me again in 6 weeks

## 2019-04-06 NOTE — Progress Notes (Signed)
[facility] [address] [place] Phone: [phone] Subjective:   I Kandace Blitz am serving as a Education administrator for Dr. Hulan Saas.  This visit occurred during the SARS-CoV-2 public health emergency.  Safety protocols were in place, including screening questions prior to the visit, additional usage of staff PPE, and extensive cleaning of exam room while observing appropriate contact time as indicated for disinfecting solutions.   I'm seeing this patient by the request  of:  Tower, Wynelle Fanny, MD  CC: Low back pain follow-up  [id number]   03/02/2019 Known degenerative disc with some radicular symptoms going down the left leg, started gabapentin and warned potential side effects, discussed icing regimen and home exercises, hold on formal physical therapy secondary to social determinants as well as coronavirus.  Discussed icing regimen and home exercises.  Follow-up with me again 4 to 6 weeks.  Update 04/06/2019 Carolyn Matthews is a 64 y.o. female coming in with complaint of lower back pain. Patient states her cramps in her legs are much better. Lower back is still painful. Noted some progress.  Would state overall about 80% better.  Still having some mild discomfort from time to time but nothing severe.  No side effects to the gabapentin.    Past Medical History:  Diagnosis Date  . Allergy    seasonal  . Anemia   . Chronic headaches   . Family history of breast cancer in female    Daughter  . Fibrocystic breast disease   . GERD (gastroesophageal reflux disease)   . Hyperglycemia    Diet controlled  . MVP (mitral valve prolapse)   . Seasonal allergies   . 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 ANKLE FRACTURE SURGERY  05/2003  . BREAST BIOPSY  03/2004   Negative  . BREAST CYST ASPIRATION  05/2007   Bilateral-benign  . BREAST LUMPECTOMY  12/1977  . TOTAL ABDOMINAL HYSTERECTOMY   09/1996   Fibroids   Social History   Socioeconomic History  . Marital status: Married    Spouse name: Not on file  . Number of children: 2  . Years of education: Not on file  . Highest education level: Not on file  Occupational History  . 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 Smoking status: Never Smoker  . Smokeless tobacco: Never Used  Substance and Sexual Activity  . Alcohol use: Yes    Alcohol/week: 0.0 standard drinks    Comment: occasional use  . Drug use: No  . Sexual activity: Not on file  Other Topics Concern  . Not on file  Social History Narrative  . Not on file   Social Determinants of Health   Financial Resource Strain:   . Difficulty of Paying Living Expenses: Not on file  Food Insecurity:   . Worried About Charity fundraiser in the Last Year: Not on file  . Ran Out of Food in the Last Year: Not on file  Transportation Needs:   . Lack of Transportation (Medical): Not on file  . Lack of Transportation (Non-Medical): Not on file  Physical Activity:   . Days of Exercise per Week: Not on file  . Minutes of Exercise per Session: Not on file  Stress:   . Feeling of Stress : Not on file  Social Connections:   . Frequency of Communication with Friends and Family: Not on file  . Frequency of Social Gatherings with Friends and Family: Not on file  . Attends Religious Services: Not on file  . Active Member of Clubs or Organizations: Not on file  . Attends Banker Meetings: Not on file  . Marital Status: Not on file   Allergies  Allergen Reactions  . Eggs Or Egg-Derived Products Other (See Comments)    REACTION: SEVERE REACTION PV 4-22 pt states she gets flu like symptoms all but fever, no anaphylasis  . Hepatitis A Vaccine Other (See Comments)    ? Allergic reaction; itching,weakness almost immediate after taking 1st vaccine   Family History  Problem  Relation Age of Onset  . Diabetes Father   . Hypertension Mother   . Breast cancer Daughter        (inflammatory)-now has brain tumor  . Diabetes Paternal Grandmother   . Breast cancer Other        great aunt  . Breast cancer Daughter        2nd daughter with breast cancer   . Colon cancer Neg Hx   . Rectal cancer Neg Hx   . Stomach cancer Neg Hx   . Esophageal cancer Neg Hx       Current Outpatient Medications (Respiratory):  .  cetirizine (ZYRTEC) 10 MG tablet, Take 10 mg by mouth daily as needed.   .  fluticasone (FLONASE) 50 MCG/ACT nasal spray, Place 2 sprays into both nostrils daily. .  SM NASAL DECONGESTANT 120 MG 12 hr tablet, TAKE 1 TABLET BY MOUTH EVERY 12 HOURS AS NEEDED FOR CONGESTION  Current Outpatient Medications (Analgesics):  .  traMADol (ULTRAM) 50 MG tablet, TAKE 1 TABLET BY MOUTH EVERY 8 HOURS AS NEEDED FOR MODERATE OR SEVERE PAIN   Current Outpatient Medications (Other):  .  gabapentin (NEURONTIN) 100 MG capsule, Take 2 capsules (200 mg total) by mouth at bedtime. .  Multiple Vitamin (MULTIVITAMIN) tablet, Take 1 tablet by mouth daily.   Marland Kitchen  omeprazole (PRILOSEC) 40 MG capsule, Take 1 capsule (40 mg total) by mouth daily as needed. .  sertraline (ZOLOFT) 50 MG tablet, Take 0.5 tablets (25 mg total) by mouth daily. Marland Kitchen  gabapentin (NEURONTIN) 300 MG capsule, Take 1 capsule (300 mg total) by mouth 3 (three) times daily.   Reviewed prior external information including notes and imaging from  primary care provider As well as notes that were available from care everywhere and other healthcare systems.  Past medical history, social, surgical and family history all reviewed in electronic medical record.  No pertanent information unless stated regarding to the chief complaint.   Review of Systems:  No headache, visual changes, nausea, vomiting, diarrhea, constipation, dizziness, abdominal pain, skin rash, fevers, chills, night sweats, weight loss, swollen lymph  nodes, body aches, joint swelling, chest pain, shortness of breath, mood changes. POSITIVE muscle aches  Objective  Blood pressure 130/72, pulse 85, height 5' 3.5" (1.613 m), weight 184 lb (83.5 kg), SpO2 96 %.   General: No apparent distress alert and oriented x3 mood and affect normal, dressed appropriately.  HEENT: Pupils equal, extraocular movements intact  Respiratory: Patient's speak in full sentences and does not appear short of breath  Cardiovascular: No lower extremity edema, non tender, no erythema  Skin: Warm dry intact with no signs of infection or rash on extremities or on axial skeleton.  Abdomen: Soft nontender  Neuro: Cranial nerves II through  XII are intact, neurovascularly intact in all extremities with 2+ DTRs and 2+ pulses.  Lymph: No lymphadenopathy of posterior or anterior cervical chain or axillae bilaterally.  Gait normal with good balance and coordination.  MSK: Moderate arthritic changes of multiple joints Back exam does have some loss of lordosis with some mild degenerative scoliosis.  Patient does have some mild tenderness noted.  Patient somewhat slow getting out of the chair but gait seems to be improved.    Impression and Recommendations:     . The above documentation has been reviewed and is accurate and complete Judi Saa, DO       Note: This dictation was prepared with Dragon dictation along with smaller phrase technology. Any transcriptional errors that result from this process are unintentional.

## 2019-04-06 NOTE — Assessment & Plan Note (Signed)
Significant improvement at this time.  Increase gabapentin to 300 mg, encourage patient to start doing home exercise more regularly, follow-up with me again in 3 to 6 weeks

## 2019-04-11 ENCOUNTER — Encounter: Payer: Self-pay | Admitting: Family Medicine

## 2019-04-11 DIAGNOSIS — J302 Other seasonal allergic rhinitis: Secondary | ICD-10-CM

## 2019-04-18 MED FILL — PSEUDOEPHEDRINE HCL ER 120: 120 | 90 days supply | Qty: 180 | Fill #1

## 2019-04-19 ENCOUNTER — Other Ambulatory Visit (HOSPITAL_COMMUNITY): Payer: Self-pay | Admitting: Unknown Physician Specialty

## 2019-05-18 ENCOUNTER — Other Ambulatory Visit: Payer: Self-pay

## 2019-05-18 ENCOUNTER — Encounter: Payer: Self-pay | Admitting: Family Medicine

## 2019-05-18 ENCOUNTER — Ambulatory Visit: Payer: No Typology Code available for payment source | Admitting: Family Medicine

## 2019-05-18 DIAGNOSIS — M5136 Other intervertebral disc degeneration, lumbar region: Secondary | ICD-10-CM

## 2019-05-18 NOTE — Assessment & Plan Note (Signed)
Patient is doing relatively well with conservative therapy and the gabapentin.  At this point patient will continue with the gabapentin at night.  Discussed home exercises and icing regimen.  Worsening symptoms we can always consider the epidural.  Continue the vitamin supplements follow-up again in multiple months

## 2019-05-18 NOTE — Progress Notes (Signed)
[facility] [address] [place] [address] Phone: [phone] Subjective:   Carolyn Matthews, am serving as a scribe for Dr. Antoine Primas. This visit occurred during the SARS-CoV-2 public health emergency.  Safety protocols were in place, including screening questions prior to the visit, additional usage of staff PPE, and extensive cleaning of exam room while observing appropriate contact time as indicated for disinfecting solutions.   I'm seeing this patient by the request  of:  Tower, Audrie Gallus, MD  CC: Low back pain follow-up  [id number]   04/06/2019 Significant improvement at this time.  Increase gabapentin to 300 mg, encourage patient to start doing home exercise more regularly, follow-up with me again in 3 to 6 weeks  Update 05/18/2019 Carolyn Matthews is a 64 y.o. female coming in with complaint of back pain. Unable to take gabapentin during the day. Pain is improving since last visit. Only taking 300mg  QHS.  Patient would state 90% better overall.  During the exercises intermittently.  States that she has been having muscle twitching in left calf at night.       Past Medical History:  Diagnosis Date  . Allergy    seasonal  . Anemia   . Chronic headaches   . Family history of breast cancer in female    Daughter  . Fibrocystic breast disease   . GERD (gastroesophageal reflux disease)   . Hyperglycemia    Diet controlled  . MVP (mitral valve prolapse)   . Seasonal allergies   . 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 ANKLE FRACTURE SURGERY  05/2003  . BREAST BIOPSY  03/2004   Negative  . BREAST CYST ASPIRATION  05/2007   Bilateral-benign  . BREAST LUMPECTOMY  12/1977  . TOTAL ABDOMINAL HYSTERECTOMY  09/1996   Fibroids   Social History   Socioeconomic History  . Marital status: Married    Spouse name: Not on file  . Number of children: 2  . Years of education: Not on  file  . Highest education level: Not on file  Occupational History  . Occupation: 10/1996: [employer]    Comment: [employer]    Employer: [employer]  Tobacco Use  . Smoking status: Never Smoker  . Smokeless tobacco: Never Used  Substance and Sexual Activity  . Alcohol use: Yes    Alcohol/week: 0.0 standard drinks    Comment: occasional use  . Drug use: No  . Sexual activity: Not on file  Other Topics Concern  . Not on file  Social History Narrative  . Not on file   Social Determinants of Health   Financial Resource Strain:   . Difficulty of Paying Living Expenses:   Food Insecurity:   . Worried About Chartered loss adjuster in the Last Year:   . Programme researcher, broadcasting/film/video in the Last Year:   Transportation Needs:   . Barista (Medical):   Freight forwarder Lack of Transportation (Non-Medical):   Physical Activity:   . Days of Exercise per Week:   . Minutes of Exercise per Session:   Stress:   . Feeling of Stress :   Social Connections:   . Frequency of Communication with Friends and Family:   . Frequency of Social Gatherings with Friends and Family:   . Attends Religious Services:   . Active Member of Clubs or Organizations:   . Attends  Club or Organization Meetings:   Marland Kitchen Marital Status:    Allergies  Allergen Reactions  . Eggs Or Egg-Derived Products Other (See Comments)    REACTION: SEVERE REACTION PV 4-22 pt states she gets flu like symptoms all but fever, no anaphylasis  . Hepatitis A Vaccine Other (See Comments)    ? Allergic reaction; itching,weakness almost immediate after taking 1st vaccine   Family History  Problem Relation Age of Onset  . Diabetes Father   . Hypertension Mother   . Breast cancer Daughter        (inflammatory)-now has brain tumor  . Diabetes Paternal Grandmother   . Breast cancer Other        great aunt  . Breast cancer Daughter        2nd daughter with breast cancer   . Colon cancer Neg Hx   . Rectal cancer Neg  Hx   . Stomach cancer Neg Hx   . Esophageal cancer Neg Hx       Current Outpatient Medications (Respiratory):  .  cetirizine (ZYRTEC) 10 MG tablet, Take 10 mg by mouth daily as needed.   .  fluticasone (FLONASE) 50 MCG/ACT nasal spray, Place 2 sprays into both nostrils daily. .  SM NASAL DECONGESTANT 120 MG 12 hr tablet, TAKE 1 TABLET BY MOUTH EVERY 12 HOURS AS NEEDED FOR CONGESTION  Current Outpatient Medications (Analgesics):  .  traMADol (ULTRAM) 50 MG tablet, TAKE 1 TABLET BY MOUTH EVERY 8 HOURS AS NEEDED FOR MODERATE OR SEVERE PAIN   Current Outpatient Medications (Other):  .  gabapentin (NEURONTIN) 100 MG capsule, Take 2 capsules (200 mg total) by mouth at bedtime. .  gabapentin (NEURONTIN) 300 MG capsule, Take 1 capsule (300 mg total) by mouth 3 (three) times daily. .  Multiple Vitamin (MULTIVITAMIN) tablet, Take 1 tablet by mouth daily.   Marland Kitchen  omeprazole (PRILOSEC) 40 MG capsule, Take 1 capsule (40 mg total) by mouth daily as needed. .  sertraline (ZOLOFT) 50 MG tablet, Take 0.5 tablets (25 mg total) by mouth daily.   Reviewed prior external information including notes and imaging from  primary care provider As well as notes that were available from care everywhere and other healthcare systems.  Past medical history, social, surgical and family history all reviewed in electronic medical record.  No pertanent information unless stated regarding to the chief complaint.   Review of Systems:  No headache, visual changes, nausea, vomiting, diarrhea, constipation, dizziness, abdominal pain, skin rash, fevers, chills, night sweats, weight loss, swollen lymph nodes, body aches, joint swelling, chest pain, shortness of breath, mood changes. POSITIVE muscle aches  Objective  Blood pressure 140/82, pulse 69, height 5' 3.5" (1.613 m), weight 180 lb (81.6 kg), SpO2 97 %.   General: No apparent distress alert and oriented x3 mood and affect normal, dressed appropriately.  HEENT: Pupils  equal, extraocular movements intact  Respiratory: Patient's speak in full sentences and does not appear short of breath  Cardiovascular: No lower extremity edema, non tender, no erythema  Neuro: Cranial nerves II through XII are intact, neurovascularly intact in all extremities with 2+ DTRs and 2+ pulses.  Gait normal with good balance and coordination.  MSK:  Non tender with full range of motion and good stability and symmetric strength and tone of shoulders, elbows, wrist, hip, knee and ankles bilaterally.  Low back exam does have some mild loss of lordosis.  Tender to palpation in the paraspinal musculature left greater than right but otherwise fairly unremarkable  with 5 out of 5 strength in lower extremities and negative straight leg test.   Impression and Recommendations:     The above documentation has been reviewed and is accurate and complete Judi Saa, DO       Note: This dictation was prepared with Dragon dictation along with smaller phrase technology. Any transcriptional errors that result from this process are unintentional.

## 2019-05-18 NOTE — Patient Instructions (Signed)
See me in 6 weeks

## 2019-05-25 MED FILL — FLUTICASONE PROP 50 MCG SPR: 50 | 90 days supply | Qty: 48 | Fill #1

## 2019-05-27 ENCOUNTER — Ambulatory Visit: Payer: No Typology Code available for payment source | Attending: Otolaryngology

## 2019-05-27 DIAGNOSIS — G4733 Obstructive sleep apnea (adult) (pediatric): Secondary | ICD-10-CM | POA: Diagnosis present

## 2019-05-30 ENCOUNTER — Other Ambulatory Visit: Payer: Self-pay

## 2019-07-22 ENCOUNTER — Ambulatory Visit: Payer: No Typology Code available for payment source | Attending: Otolaryngology

## 2019-07-22 DIAGNOSIS — G4733 Obstructive sleep apnea (adult) (pediatric): Secondary | ICD-10-CM | POA: Diagnosis not present

## 2019-07-23 ENCOUNTER — Other Ambulatory Visit: Payer: Self-pay

## 2019-08-15 MED FILL — GABAPENTIN 300 MG CAPSULE: 300 | 30 days supply | Qty: 90 | Fill #1

## 2019-09-02 MED FILL — FLUTICASONE PROP 50 MCG SPR: 50 | 90 days supply | Qty: 48 | Fill #0

## 2019-10-04 MED FILL — SERTRALINE HCL 50 MG TABLET: 50 | 90 days supply | Qty: 45 | Fill #2

## 2019-10-27 ENCOUNTER — Ambulatory Visit (INDEPENDENT_AMBULATORY_CARE_PROVIDER_SITE_OTHER): Payer: No Typology Code available for payment source | Admitting: Obstetrics and Gynecology

## 2019-10-27 ENCOUNTER — Other Ambulatory Visit: Payer: Self-pay

## 2019-10-27 ENCOUNTER — Encounter: Payer: Self-pay | Admitting: Obstetrics and Gynecology

## 2019-10-27 VITALS — BP 127/70 | Ht 64.0 in | Wt 177.0 lb

## 2019-10-27 DIAGNOSIS — N952 Postmenopausal atrophic vaginitis: Secondary | ICD-10-CM

## 2019-10-27 DIAGNOSIS — Z01419 Encounter for gynecological examination (general) (routine) without abnormal findings: Secondary | ICD-10-CM | POA: Diagnosis not present

## 2019-10-27 DIAGNOSIS — Z1339 Encounter for screening examination for other mental health and behavioral disorders: Secondary | ICD-10-CM

## 2019-10-27 NOTE — Progress Notes (Signed)
Routine Annual Gynecology Examination   PCP: Judy Pimple, MD  Chief Complaint:  Chief Complaint  Patient presents with  . Gynecologic Exam    no concerns    History of Present Illness: Patient is a 64 y.o. Matthews presents for annual exam. The patient has no complaints today.   Menopausal bleeding: denies  Menopausal symptoms: denies  Breast symptoms: denies  Last pap smear:  Distant due to history of hysterectomy.   Last mammogram: 01/18/2019.  Result Normal   Last colonoscopy: 2016. Follow up 10 years.   She notes some vaginal burning, then this goes away.  She doesn't treat these symptoms.  This occurs every 3 months or so and lasts about 1-2 weeks.  She notes discomfort with intercourse during these. She has tried lubricant and it helps a little bit.  While not having these symptoms she does not have pain with intercourse.  She denies frequent UTIs.   She has two daughters both with breast cancer at early ages. Both have been tested for genetic mutations and both were negative.    Past Medical History:  Diagnosis Date  . Allergy    seasonal  . Anemia   . Chronic headaches   . Family history of breast cancer in female    Daughter  . Fibrocystic breast disease   . GERD (gastroesophageal reflux disease)   . Hyperglycemia    Diet controlled  . MVP (mitral valve prolapse)   . Seasonal allergies   . 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    Past Surgical History:  Procedure Laterality Date  . ANKLE FRACTURE SURGERY  05/2003  . BREAST BIOPSY  03/2004   Negative  . BREAST CYST ASPIRATION  05/2007   Bilateral-benign  . BREAST LUMPECTOMY  12/1977  . TOTAL ABDOMINAL HYSTERECTOMY  09/1996   Fibroids    Prior to Admission medications   Medication Sig Start Date End Date Taking? Authorizing Provider  cetirizine (ZYRTEC) 10 MG tablet Take 10 mg by mouth daily as needed.     Yes [provider]  fluticasone (FLONASE) 50 MCG/ACT nasal spray Place 2 sprays  into both nostrils daily. 02/08/19  Yes Tower, Audrie Gallus, MD  gabapentin (NEURONTIN) 300 MG capsule Take 1 capsule (300 mg total) by mouth 3 (three) times daily. 04/06/19  Yes Judi Saa, DO  Multiple Vitamin (MULTIVITAMIN) tablet Take 1 tablet by mouth daily.     Yes [provider]  sertraline (ZOLOFT) 50 MG tablet Take 0.5 tablets (25 mg total) by mouth daily. 12/10/18  Yes Tower, Audrie Gallus, MD  SM NASAL DECONGESTANT 120 MG 12 hr tablet TAKE 1 TABLET BY MOUTH EVERY 12 HOURS AS NEEDED FOR CONGESTION 01/07/19  Yes Tower, Audrie Gallus, MD    Allergies  Allergen Reactions  . Eggs Or Egg-Derived Products Other (See Comments)    REACTION: SEVERE REACTION PV 4-22 pt states she gets flu like symptoms all but fever, no anaphylasis  . Hepatitis A Vaccine Other (See Comments)    ? Allergic reaction; itching,weakness almost immediate after taking 1st vaccine    Obstetric History: Matthews  Social History   Socioeconomic History  . Marital status: Married    Spouse name: Not on file  . Number of children: 2  . Years of education: Not on file  . Highest education level: Not on file  Occupational History  . Occupation: Chartered loss adjuster:     Comment: [employer]  Employer: [employer]  Tobacco Use  . Smoking status: Never Smoker  . Smokeless tobacco: Never Used  Vaping Use  . Vaping Use: Never used  Substance and Sexual Activity  . Alcohol use: Yes    Alcohol/week: 0.0 standard drinks    Comment: occasional use  . Drug use: No  . Sexual activity: Yes    Birth control/protection: Surgical    Comment: Hysterectomy  Other Topics Concern  . Not on file  Social History Narrative  . Not on file   Social Determinants of Health   Financial Resource Strain:   . Difficulty of Paying Living Expenses: Not on file  Food Insecurity:   . Worried About Programme researcher, broadcasting/film/video in the Last Year: Not on file  . Ran Out of Food in the Last Year: Not on file    Transportation Needs:   . Lack of Transportation (Medical): Not on file  . Lack of Transportation (Non-Medical): Not on file  Physical Activity:   . Days of Exercise per Week: Not on file  . Minutes of Exercise per Session: Not on file  Stress:   . Feeling of Stress : Not on file  Social Connections:   . Frequency of Communication with Friends and Family: Not on file  . Frequency of Social Gatherings with Friends and Family: Not on file  . Attends Religious Services: Not on file  . Active Member of Clubs or Organizations: Not on file  . Attends Banker Meetings: Not on file  . Marital Status: Not on file  Intimate Partner Violence:   . Fear of Current or Ex-Partner: Not on file  . Emotionally Abused: Not on file  . Physically Abused: Not on file  . Sexually Abused: Not on file    Family History  Problem Relation Age of Onset  . Diabetes Father   . Hypertension Mother   . Breast cancer Daughter        (inflammatory)-now has brain tumor  . Diabetes Paternal Grandmother   . Breast cancer Other        mat great aunt  . Breast cancer Daughter        2nd daughter with breast cancer   . Colon cancer Neg Hx   . Rectal cancer Neg Hx   . Stomach cancer Neg Hx   . Esophageal cancer Neg Hx     Review of Systems  Constitutional: Negative.   HENT: Negative.   Eyes: Negative.   Respiratory: Negative.   Cardiovascular: Negative.   Gastrointestinal: Negative.   Genitourinary: Negative.   Musculoskeletal: Negative.   Skin: Negative.   Neurological: Negative.   Psychiatric/Behavioral: Negative.      Physical Exam Vitals: BP 127/70   Ht 5\' 4"  (1.626 m)   Wt 177 lb (80.3 kg)   BMI 30.38 kg/m   Physical Exam Constitutional:      General: She is not in acute distress.    Appearance: Normal appearance. She is well-developed.  Genitourinary:     Pelvic exam was performed with patient in the lithotomy position.     Vulva, urethra and bladder normal.     No  inguinal adenopathy present in the right or left side.    No signs of injury in the vagina.     No vaginal discharge, erythema, tenderness or bleeding.     Cervix is absent.     Uterus is absent.     No right or left adnexal mass present.  Right adnexa not tender or full.     Left adnexa not tender or full.  HENT:     Head: Normocephalic and atraumatic.  Eyes:     General: No scleral icterus.    Conjunctiva/sclera: Conjunctivae normal.  Neck:     Thyroid: No thyromegaly.  Cardiovascular:     Rate and Rhythm: Normal rate and regular rhythm.     Heart sounds: No murmur heard.  No friction rub. No gallop.   Pulmonary:     Effort: Pulmonary effort is normal. No respiratory distress.     Breath sounds: Normal breath sounds. No wheezing or rales.  Chest:     Comments: Deferred as she states her PCP performs breast exams Abdominal:     General: Bowel sounds are normal. There is no distension.     Palpations: Abdomen is soft. There is no mass.     Tenderness: There is no abdominal tenderness. There is no guarding or rebound.  Musculoskeletal:        General: No swelling or tenderness. Normal range of motion.     Cervical back: Normal range of motion and neck supple.  Lymphadenopathy:     Cervical: No cervical adenopathy.     Lower Body: No right inguinal adenopathy. No left inguinal adenopathy.  Neurological:     General: No focal deficit present.     Mental Status: She is alert and oriented to person, place, and time.     Cranial Nerves: No cranial nerve deficit.  Skin:    General: Skin is warm and dry.     Findings: No erythema or rash.  Psychiatric:        Mood and Affect: Mood normal.        Behavior: Behavior normal.        Judgment: Judgment normal.      Female chaperone present for pelvic and breast  portions of the physical exam  Results: AUDIT Questionnaire (screen for alcoholism): 2   Assessment and Plan:  64 y.o. Matthews female here for routine annual  gynecologic examination  Plan: Problem List Items Addressed This Visit    None    Visit Diagnoses    Women's annual routine gynecological examination    -  Primary   Relevant Medications   conjugated estrogens (PREMARIN) vaginal cream (Start on 10/31/2019)   Screening for alcoholism       Vaginal atrophy       Relevant Medications   conjugated estrogens (PREMARIN) vaginal cream (Start on 10/31/2019)      Screening: -- Blood pressure screen normal -- Colonoscopy - per PCP -- Mammogram - not due -- Weight screening: overweight: continue to monitor -- Depression screening negative (PHQ-9) -- Nutrition: normal -- cholesterol screening: per PCP -- osteoporosis screening: not due -- tobacco screening: not using -- alcohol screening: AUDIT questionnaire indicates low-risk usage. -- family history of breast cancer screening: done. not at high risk. -- no evidence of domestic violence or intimate partner violence. -- STD screening: gonorrhea/chlamydia NAAT not collected per patient request. -- pap smear not collected per ASCCP guidelines  For her atrophy and vaginal symptoms, she was given some samples  Of premarin. Rx given.  Instructions on how to use were also given.   Thomasene Mohair, MD 10/28/2019 5:58 PM

## 2019-10-28 ENCOUNTER — Encounter: Payer: Self-pay | Admitting: Obstetrics and Gynecology

## 2019-10-28 MED ORDER — ESTROGENS, CONJUGATED 0.625 MG/GM VA CREA: 1.0000 | TOPICAL_CREAM | VAGINAL | 3 refills | Status: AC

## 2019-10-29 MED FILL — PREMARIN VAGINAL CREAM-APPL: 0.625 | 90 days supply | Qty: 30 | Fill #0

## 2019-11-02 ENCOUNTER — Encounter: Payer: Self-pay | Admitting: Obstetrics and Gynecology

## 2019-11-23 MED FILL — GABAPENTIN 300 MG CAPSULE: 300 | 30 days supply | Qty: 90 | Fill #2

## 2019-12-20 ENCOUNTER — Encounter: Payer: Self-pay | Admitting: Family Medicine

## 2019-12-21 ENCOUNTER — Other Ambulatory Visit: Payer: Self-pay | Admitting: Family Medicine

## 2019-12-21 MED ORDER — SERTRALINE HCL 50 MG PO TABS: 25.0000 mg | ORAL_TABLET | Freq: Every day | ORAL | 0 refills | Status: DC

## 2019-12-28 MED FILL — SERTRALINE HCL 50 MG TABLET: 50 | 90 days supply | Qty: 45 | Fill #0

## 2020-01-03 ENCOUNTER — Other Ambulatory Visit: Payer: Self-pay | Admitting: Family Medicine

## 2020-01-03 DIAGNOSIS — Z1231 Encounter for screening mammogram for malignant neoplasm of breast: Secondary | ICD-10-CM

## 2020-01-20 ENCOUNTER — Ambulatory Visit
Admission: RE | Admit: 2020-01-20 | Discharge: 2020-01-20 | Disposition: A | Discharge status: Home / Self Care | Payer: No Typology Code available for payment source | Source: Ambulatory Visit

## 2020-01-20 ENCOUNTER — Ambulatory Visit (INDEPENDENT_AMBULATORY_CARE_PROVIDER_SITE_OTHER): Payer: No Typology Code available for payment source | Admitting: Family Medicine

## 2020-01-20 ENCOUNTER — Other Ambulatory Visit: Payer: Self-pay

## 2020-01-20 ENCOUNTER — Other Ambulatory Visit: Payer: Self-pay | Admitting: Family Medicine

## 2020-01-20 VITALS — BP 134/68 | HR 75 | Temp 97.1°F | Ht 63.25 in | Wt 178.2 lb

## 2020-01-20 DIAGNOSIS — R7303 Prediabetes: Secondary | ICD-10-CM

## 2020-01-20 DIAGNOSIS — Z1231 Encounter for screening mammogram for malignant neoplasm of breast: Secondary | ICD-10-CM

## 2020-01-20 DIAGNOSIS — F411 Generalized anxiety disorder: Secondary | ICD-10-CM

## 2020-01-20 DIAGNOSIS — E7800 Pure hypercholesterolemia, unspecified: Secondary | ICD-10-CM

## 2020-01-20 DIAGNOSIS — E559 Vitamin D deficiency, unspecified: Secondary | ICD-10-CM | POA: Diagnosis not present

## 2020-01-20 DIAGNOSIS — Z0000 Encounter for general adult medical examination without abnormal findings: Secondary | ICD-10-CM

## 2020-01-20 DIAGNOSIS — G4733 Obstructive sleep apnea (adult) (pediatric): Secondary | ICD-10-CM | POA: Insufficient documentation

## 2020-01-20 LAB — CBC WITH DIFFERENTIAL/PLATELET
Basophils Absolute: 0.1 10*3/uL (ref 0.0–0.1)
Basophils Relative: 0.7 % (ref 0.0–3.0)
Eosinophils Absolute: 0.1 10*3/uL (ref 0.0–0.7)
Eosinophils Relative: 2.1 % (ref 0.0–5.0)
HCT: 33.8 % — ABNORMAL LOW (ref 36.0–46.0)
Hemoglobin: 11.2 g/dL — ABNORMAL LOW (ref 12.0–15.0)
Lymphocytes Relative: 24.5 % (ref 12.0–46.0)
Lymphs Abs: 1.7 10*3/uL (ref 0.7–4.0)
MCHC: 33.2 g/dL (ref 30.0–36.0)
MCV: 91.5 fl (ref 78.0–100.0)
Monocytes Absolute: 0.4 10*3/uL (ref 0.1–1.0)
Monocytes Relative: 5.4 % (ref 3.0–12.0)
Neutro Abs: 4.8 10*3/uL (ref 1.4–7.7)
Neutrophils Relative %: 67.3 % (ref 43.0–77.0)
Platelets: 337 10*3/uL (ref 150.0–400.0)
RBC: 3.69 Mil/uL — ABNORMAL LOW (ref 3.87–5.11)
RDW: 14.1 % (ref 11.5–15.5)
WBC: 7.1 10*3/uL (ref 4.0–10.5)

## 2020-01-20 LAB — COMPREHENSIVE METABOLIC PANEL
ALT: 14 U/L (ref 0–35)
AST: 15 U/L (ref 0–37)
Albumin: 4.4 g/dL (ref 3.5–5.2)
Alkaline Phosphatase: 78 U/L (ref 39–117)
BUN: 15 mg/dL (ref 6–23)
CO2: 31 mEq/L (ref 19–32)
Calcium: 9.7 mg/dL (ref 8.4–10.5)
Chloride: 105 mEq/L (ref 96–112)
Creatinine, Ser: 0.8 mg/dL (ref 0.40–1.20)
GFR: 78.64 mL/min (ref >60.00)
Glucose, Bld: 99 mg/dL (ref 70–99)
Potassium: 4.4 mEq/L (ref 3.5–5.1)
Sodium: 139 mEq/L (ref 135–145)
Total Bilirubin: 0.5 mg/dL (ref 0.2–1.2)
Total Protein: 7.3 g/dL (ref 6.0–8.3)

## 2020-01-20 LAB — LIPID PANEL
Cholesterol: 205 mg/dL — ABNORMAL HIGH (ref 0–200)
HDL: 50.1 mg/dL (ref >39.00)
LDL Cholesterol: 137 mg/dL — ABNORMAL HIGH (ref 0–99)
NonHDL: 154.73
Total CHOL/HDL Ratio: 4
Triglycerides: 88 mg/dL (ref 0.0–149.0)
VLDL: 17.6 mg/dL (ref 0.0–40.0)

## 2020-01-20 LAB — VITAMIN D 25 HYDROXY (VIT D DEFICIENCY, FRACTURES): VITD: 30.53 ng/mL (ref 30.00–100.00)

## 2020-01-20 LAB — TSH: TSH: 2.04 u[IU]/mL (ref 0.35–4.50)

## 2020-01-20 LAB — HEMOGLOBIN A1C: Hgb A1c MFr Bld: 6 % (ref 4.6–6.5)

## 2020-01-20 MED ORDER — FLUTICASONE PROPIONATE 50 MCG/ACT NA SUSP
2.0000 | Freq: Every day | NASAL | 3 refills | Status: DC
Start: 2020-01-20 — End: 2020-01-20

## 2020-01-20 MED ORDER — SERTRALINE HCL 50 MG PO TABS
25.0000 mg | ORAL_TABLET | Freq: Every day | ORAL | 3 refills | Status: DC
Start: 2020-01-20 — End: 2020-01-20

## 2020-01-20 MED FILL — FLUTICASONE PROP 50 MCG SPR: 50 | 90 days supply | Qty: 48 | Fill #0

## 2020-01-20 NOTE — Progress Notes (Signed)
Subjective:    Patient ID: Carolyn Matthews, female    DOB: [birth year], 64 y.o.   MRN: [id number]  This visit occurred during the SARS-CoV-2 public health emergency.  Safety protocols were in place, including screening questions prior to the visit, additional usage of staff PPE, and extensive cleaning of exam room while observing appropriate contact time as indicated for disinfecting solutions.    HPI Here for health maintenance exam and to review chronic medical problems    Wt Readings from Last 3 Encounters:  01/20/20 178 lb 3 oz (80.8 kg)  10/27/19 177 lb (80.3 kg)  05/18/19 180 lb (81.6 kg)   31.32 kg/m   No exercise  Hard to fit in time  Remodeling basement and up and down steps    Not doing a lot lately , just working   Had gyn visit on 9/23 Not due for pap at that time  covid status  Immunized in April  Cramps in legs  ? From veins    Tdap 1/19  Flu shot -no due to egg allergy  Zoster status -is interested    Mammogram had this am  Self breast exam -no lumps/ has dense breasts    Colonoscopy 5/16   BP Readings from Last 3 Encounters:  01/20/20 134/68  10/27/19 127/70  05/18/19 140/82   Pulse Readings from Last 3 Encounters:  01/20/20 75  05/18/19 69  04/06/19 85   Due for labs today   Vit D def Prediabetes  Lab Results  Component Value Date   HGBA1C 6.2 12/10/2018  sugar intake- still a problem Ice cream   Hyperlipidemia Lab Results  Component Value Date   CHOL 203 (H) 12/10/2018   HDL 49.60 12/10/2018   LDLCALC 138 (H) 12/10/2018   TRIG 79.0 12/10/2018   CHOLHDL 4 12/10/2018   Takes sertraline for anxiety disorder   Diet has been pretty good  Does not fry any more  No sausage and bacon   osa -new diag Has cpap and likes it -this week  Patient Active Problem List   Diagnosis Date Noted  . OSA (obstructive sleep apnea) 01/20/2020  . Chest discomfort 03/21/2018  . Leg cramps 03/19/2018  . GERD (gastroesophageal reflux  disease) 03/19/2018  . Lumbar degenerative disc disease 12/08/2016  . Lumbar back pain with radiculopathy affecting left lower extremity 12/02/2016  . Patellar subluxation, left, initial encounter 06/23/2016  . Vitamin D deficiency 10/03/2015  . Encounter for vitamin deficiency screening 11/14/2014  . Thoracic back pain 01/13/2014  . Colon cancer screening 08/11/2012  . 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 Anxiety disorder 02/26/2011  . 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 Other screening mammogram 08/28/2010  . Routine general medical examination at a health care facility 05/23/2010  . MVP (mitral valve prolapse)   . Chronic headaches   . Seasonal allergies   . Family history of breast cancer in female   . Prediabetes 04/04/2009  . Hyperlipidemia 11/22/2008  . Anemia, unspecified 02/12/2007  . OVERACTIVE BLADDER 01/11/2007  . FIBROCYSTIC BREAST DISEASE 01/11/2007   Past Medical History:  Diagnosis Date  . Allergy    seasonal  . Anemia   . Chronic headaches   . Family history of breast cancer in female    Daughter; gene neg per pt  . Fibrocystic breast disease   . GERD (gastroesophageal reflux disease)   . Hyperglycemia    Diet controlled  . MVP (mitral valve prolapse)   . Seasonal allergies   .  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 ANKLE FRACTURE SURGERY  05/2003  . BREAST BIOPSY  03/2004   Negative  . BREAST BIOPSY Right 12/27/2014   fat necrosis / fibrocystic changes  . BREAST CYST ASPIRATION  05/2007   Bilateral-benign  . BREAST LUMPECTOMY  12/1977  . TOTAL ABDOMINAL HYSTERECTOMY  09/1996   Fibroids   Social History   Tobacco Use  . Smoking status: Never Smoker  . Smokeless tobacco: Never Used  Vaping Use  . Vaping Use: Never used  Substance Use Topics  . Alcohol use: Yes    Alcohol/week: 0.0 standard drinks    Comment: occasional use  . Drug use: No   Family History  Problem Relation  Age of Onset  . Diabetes Father   . Hypertension Mother   . Breast cancer Daughter        (inflammatory)-now has brain tumor  . Diabetes Paternal Grandmother   . Breast cancer Other        mat great aunt  . Breast cancer Daughter        2nd daughter with breast cancer   . Colon cancer Neg Hx   . Rectal cancer Neg Hx   . Stomach cancer Neg Hx   . Esophageal cancer Neg Hx    Allergies  Allergen Reactions  . Eggs Or Egg-Derived Products Other (See Comments)    REACTION: SEVERE REACTION PV 4-22 pt states she gets flu like symptoms all but fever, no anaphylasis  . Hepatitis A Vaccine Other (See Comments)    ? Allergic reaction; itching,weakness almost immediate after taking 1st vaccine   Current Outpatient Medications on File Prior to Visit  Medication Sig Dispense Refill  . cetirizine (ZYRTEC) 10 MG tablet Take 10 mg by mouth daily as needed.    . conjugated estrogens (PREMARIN) vaginal cream Place 1 Applicatorful vaginally 2 (two) times a week. 0.5 gram vaginally at bedtime twice weekly 30 g 3  . gabapentin (NEURONTIN) 300 MG capsule Take 1 capsule (300 mg total) by mouth 3 (three) times daily. (Patient taking differently: Take 300 mg by mouth daily.) 90 capsule 3  . Multiple Vitamin (MULTIVITAMIN) tablet Take 1 tablet by mouth daily.     No current facility-administered medications on file prior to visit.    Review of Systems  Constitutional: Negative for activity change, appetite change, fatigue, fever and unexpected weight change.  HENT: Negative for congestion, ear pain, rhinorrhea, sinus pressure and sore throat.   Eyes: Negative for pain, redness and visual disturbance.  Respiratory: Negative for cough, shortness of breath and wheezing.   Cardiovascular: Negative for chest pain and palpitations.  Gastrointestinal: Negative for abdominal pain, blood in stool, constipation and diarrhea.  Endocrine: Negative for polydipsia and polyuria.  Genitourinary: Negative for dysuria,  frequency and urgency.  Musculoskeletal: Positive for arthralgias and back pain. Negative for myalgias.  Skin: Negative for pallor and rash.  Allergic/Immunologic: Negative for environmental allergies.  Neurological: Negative for dizziness, syncope and headaches.  Hematological: Negative for adenopathy. Does not bruise/bleed easily.  Psychiatric/Behavioral: Negative for decreased concentration and dysphoric mood. The patient is not nervous/anxious.        Anxiety is fairly controlled       Objective:   Physical Exam Constitutional:      General: She is not in acute distress.    Appearance: Normal appearance. She is well-developed. She is obese. She is not ill-appearing or diaphoretic.  HENT:  Head: Normocephalic and atraumatic.     Right Ear: Tympanic membrane, ear canal and external ear normal.     Left Ear: Tympanic membrane, ear canal and external ear normal.     Nose: Nose normal. No congestion.     Mouth/Throat:     Mouth: Mucous membranes are moist.     Pharynx: Oropharynx is clear. No posterior oropharyngeal erythema.  Eyes:     General: No scleral icterus.    Extraocular Movements: Extraocular movements intact.     Conjunctiva/sclera: Conjunctivae normal.     Pupils: Pupils are equal, round, and reactive to light.  Neck:     Thyroid: No thyromegaly.     Vascular: No carotid bruit or JVD.  Cardiovascular:     Rate and Rhythm: Normal rate and regular rhythm.     Pulses: Normal pulses.     Heart sounds: Normal heart sounds. No gallop.   Pulmonary:     Effort: Pulmonary effort is normal. No respiratory distress.     Breath sounds: Normal breath sounds. No wheezing.     Comments: Good air exch Chest:     Chest wall: No tenderness.  Abdominal:     General: Bowel sounds are normal. There is no distension or abdominal bruit.     Palpations: Abdomen is soft. There is no mass.     Tenderness: There is no abdominal tenderness.     Hernia: No hernia is present.   Genitourinary:    Comments: Breast exam: No mass, nodules, thickening, tenderness, bulging, retraction, inflamation, nipple discharge or skin changes noted.  No axillary or clavicular LA.     Musculoskeletal:        General: No tenderness. Normal range of motion.     Cervical back: Normal range of motion and neck supple. No rigidity. No muscular tenderness.     Right lower leg: No edema.     Left lower leg: No edema.     Comments: No kyphosis   Lymphadenopathy:     Cervical: No cervical adenopathy.  Skin:    General: Skin is warm and dry.     Coloration: Skin is not pale.     Findings: No erythema or rash.     Comments: Few skin tags and lentigines   Neurological:     Mental Status: She is alert. Mental status is at baseline.     Cranial Nerves: No cranial nerve deficit.     Motor: No abnormal muscle tone.     Coordination: Coordination normal.     Gait: Gait normal.     Deep Tendon Reflexes: Reflexes are normal and symmetric. Reflexes normal.  Psychiatric:        Mood and Affect: Mood normal.        Cognition and Memory: Cognition and memory normal.           Assessment & Plan:   Problem List Items Addressed This Visit      Respiratory   OSA (obstructive sleep apnea)    Doing better with cpap so far        Other   Hyperlipidemia    Labs today  Disc goals for lipids and reasons to control them Rev last labs with pt Rev low sat fat diet in detail Per pt -diet is pretty good        Relevant Orders   Lipid panel (Completed)   Prediabetes    A1c today  disc imp of low glycemic diet and wt loss to  prevent DM2       Relevant Orders   Hemoglobin A1c (Completed)   Routine general medical examination at a health care facility - Primary    Reviewed health habits including diet and exercise and skin cancer prevention Reviewed appropriate screening tests for age  Also reviewed health mt list, fam hx and immunization status , as well as social and family history    See hpi Labs ordered  Disc need for exercise  utd gyn care  covid immunized and planning booster  Interested in shingrix after that  No flu shot due to allergy  Had mammogram this am-pending report        Relevant Orders   Comprehensive metabolic panel (Completed)   CBC with Differential/Platelet (Completed)   Lipid panel (Completed)   TSH (Completed)   Anxiety disorder    Sertraline continues to work well for her  Reviewed stressors/ coping techniques/symptoms/ support sources/ tx options and side effects in detail today Enc good self care and add exercise       Relevant Medications   sertraline (ZOLOFT) 50 MG tablet   Vitamin D deficiency    Level today with oral supplementation  Disc imp to bone and overall health      Relevant Orders   VITAMIN D 25 Hydroxy (Vit-D Deficiency, Fractures) (Completed)

## 2020-01-20 NOTE — Patient Instructions (Addendum)
Think about an extra 30 minutes daily- yoga videos would be great   If you are interested in the shingles vaccine series (Shingrix), call your insurance or pharmacy to check on coverage and location it must be given.  If affordable - you can schedule it here or at your pharmacy depending on coverage   Get your covid booster first   For cholesterol Avoid red meat/ fried foods/ egg yolks/ fatty breakfast meats/ butter, cheese and high fat dairy/ and shellfish   For pre diabetes Try to get most of your carbohydrates from produce (with the exception of white potatoes)  Eat less bread/pasta/rice/snack foods/cereals/sweets and other items from the middle of the grocery store (processed carbs)

## 2020-01-22 ENCOUNTER — Encounter: Payer: Self-pay | Admitting: Family Medicine

## 2020-01-22 NOTE — Assessment & Plan Note (Signed)
Level today with oral supplementation  Disc imp to bone and overall health 

## 2020-01-22 NOTE — Assessment & Plan Note (Signed)
A1c today disc imp of low glycemic diet and wt loss to prevent DM2  

## 2020-01-22 NOTE — Assessment & Plan Note (Signed)
Labs today  Disc goals for lipids and reasons to control them Rev last labs with pt Rev low sat fat diet in detail Per pt -diet is pretty good

## 2020-01-22 NOTE — Assessment & Plan Note (Signed)
Reviewed health habits including diet and exercise and skin cancer prevention Reviewed appropriate screening tests for age  Also reviewed health mt list, fam hx and immunization status , as well as social and family history   See hpi Labs ordered  Disc need for exercise  utd gyn care  covid immunized and planning booster  Interested in shingrix after that  No flu shot due to allergy  Had mammogram this am-pending report

## 2020-01-22 NOTE — Assessment & Plan Note (Signed)
Doing better with cpap so far

## 2020-01-22 NOTE — Assessment & Plan Note (Signed)
Sertraline continues to work well for her  Reviewed stressors/ coping techniques/symptoms/ support sources/ tx options and side effects in detail today Enc good self care and add exercise

## 2020-01-29 ENCOUNTER — Encounter: Payer: Self-pay | Admitting: Family Medicine

## 2020-02-27 ENCOUNTER — Ambulatory Visit: Payer: No Typology Code available for payment source | Attending: Internal Medicine

## 2020-02-27 ENCOUNTER — Other Ambulatory Visit (HOSPITAL_BASED_OUTPATIENT_CLINIC_OR_DEPARTMENT_OTHER): Payer: Self-pay | Admitting: Internal Medicine

## 2020-02-27 DIAGNOSIS — Z23 Encounter for immunization: Secondary | ICD-10-CM

## 2020-02-27 MED FILL — PFIZER-BIONTECH COVID-19 VA: 30 | 21 days supply | Qty: 0 | Fill #0

## 2020-02-27 NOTE — Progress Notes (Signed)
   Covid-19 Vaccination Clinic  Name:  Carolyn Matthews    MRN: [id number] DOB: 1955-12-13  02/27/2020  [name] was observed post Covid-19 immunization for 15 minutes without incident. She was provided with Vaccine Information Sheet and instruction to access the V-Safe system.   [name] was instructed to call 911 with any severe reactions post vaccine: Marland Kitchen Difficulty breathing  . Swelling of face and throat  . A fast heartbeat  . A bad rash all over body  . Dizziness and weakness   Immunizations Administered    Name Date Dose VIS Date Route   Pfizer COVID-19 Vaccine 02/27/2020  1:43 PM 0.3 mL 11/23/2019 Intramuscular   Manufacturer: [employer], [employer]   Lot: [id number]   NDC: [address]-7

## 2020-03-06 ENCOUNTER — Ambulatory Visit: Payer: No Typology Code available for payment source

## 2020-03-20 MED FILL — GABAPENTIN 300 MG CAPSULE: 300 | 30 days supply | Qty: 90 | Fill #3

## 2020-04-05 ENCOUNTER — Encounter: Payer: Self-pay | Admitting: Family Medicine

## 2020-04-05 DIAGNOSIS — M5416 Radiculopathy, lumbar region: Secondary | ICD-10-CM

## 2020-04-05 DIAGNOSIS — J301 Allergic rhinitis due to pollen: Secondary | ICD-10-CM

## 2020-04-05 DIAGNOSIS — G4733 Obstructive sleep apnea (adult) (pediatric): Secondary | ICD-10-CM

## 2020-04-06 DIAGNOSIS — J309 Allergic rhinitis, unspecified: Secondary | ICD-10-CM | POA: Insufficient documentation

## 2020-04-06 MED FILL — FLUTICASONE PROP 50 MCG SPR: 50 | 90 days supply | Qty: 48 | Fill #1

## 2020-04-09 NOTE — Addendum Note (Signed)
Addended by: Roxy Manns A on: 04/09/2020 12:43 PM   Modules accepted: Orders

## 2020-04-17 ENCOUNTER — Telehealth: Payer: Self-pay | Admitting: Family Medicine

## 2020-04-17 DIAGNOSIS — E7800 Pure hypercholesterolemia, unspecified: Secondary | ICD-10-CM

## 2020-04-17 NOTE — Telephone Encounter (Signed)
-----   Message from Alvina Chou sent at 04/17/2020 12:00 PM EDT ----- Regarding: Lab orders for Wednesday, 3.16.22 Lab orders, no f/u

## 2020-04-18 ENCOUNTER — Other Ambulatory Visit: Payer: Self-pay

## 2020-04-18 ENCOUNTER — Other Ambulatory Visit (HOSPITAL_COMMUNITY): Payer: Self-pay | Admitting: Unknown Physician Specialty

## 2020-04-18 ENCOUNTER — Other Ambulatory Visit (INDEPENDENT_AMBULATORY_CARE_PROVIDER_SITE_OTHER): Payer: No Typology Code available for payment source

## 2020-04-18 DIAGNOSIS — E7800 Pure hypercholesterolemia, unspecified: Secondary | ICD-10-CM | POA: Diagnosis not present

## 2020-04-18 LAB — LIPID PANEL
Cholesterol: 190 mg/dL (ref 0–200)
HDL: 47.9 mg/dL (ref >39.00)
LDL Cholesterol: 129 mg/dL — ABNORMAL HIGH (ref 0–99)
NonHDL: 141.95
Total CHOL/HDL Ratio: 4
Triglycerides: 67 mg/dL (ref 0.0–149.0)
VLDL: 13.4 mg/dL (ref 0.0–40.0)

## 2020-04-26 ENCOUNTER — Other Ambulatory Visit (HOSPITAL_BASED_OUTPATIENT_CLINIC_OR_DEPARTMENT_OTHER): Payer: Self-pay

## 2020-05-01 MED FILL — SERTRALINE HCL 50 MG TABS: 50 | 90 days supply | Qty: 45 | Fill #0

## 2020-06-06 ENCOUNTER — Other Ambulatory Visit: Payer: Self-pay

## 2020-06-06 ENCOUNTER — Ambulatory Visit (INDEPENDENT_AMBULATORY_CARE_PROVIDER_SITE_OTHER): Payer: No Typology Code available for payment source

## 2020-06-06 DIAGNOSIS — Z23 Encounter for immunization: Secondary | ICD-10-CM

## 2020-06-06 NOTE — Progress Notes (Signed)
Patient presented for 1st shingles vaccine given by Wallie Lagrand, CMA to left deltoid, patient voiced no concerns nor showed any signs of distress during injection.  

## 2020-06-17 ENCOUNTER — Other Ambulatory Visit: Payer: Self-pay | Admitting: Family Medicine

## 2020-06-18 NOTE — Telephone Encounter (Signed)
Sent patient MyChart message to schedule follow-up visit.

## 2020-06-18 NOTE — Telephone Encounter (Signed)
Spoke with patient and she is scheduled to come in with her husband.

## 2020-06-19 ENCOUNTER — Other Ambulatory Visit (HOSPITAL_COMMUNITY): Payer: Self-pay

## 2020-07-11 ENCOUNTER — Other Ambulatory Visit: Payer: Self-pay

## 2020-07-11 ENCOUNTER — Ambulatory Visit
Admission: EM | Admit: 2020-07-11 | Discharge: 2020-07-11 | Disposition: A | Discharge status: Home / Self Care | Payer: No Typology Code available for payment source | Attending: Emergency Medicine | Admitting: Emergency Medicine

## 2020-07-11 ENCOUNTER — Telehealth: Payer: Self-pay

## 2020-07-11 DIAGNOSIS — F411 Generalized anxiety disorder: Secondary | ICD-10-CM | POA: Diagnosis not present

## 2020-07-11 DIAGNOSIS — M940 Chondrocostal junction syndrome [Tietze]: Secondary | ICD-10-CM | POA: Diagnosis not present

## 2020-07-11 MED ORDER — HYDROXYZINE HCL 25 MG PO TABS: ORAL_TABLET | ORAL | 0 refills | Status: DC

## 2020-07-11 MED ORDER — CLONIDINE HCL 0.1 MG PO TABS
0.1000 mg | ORAL_TABLET | Freq: Once | ORAL | Status: AC
  Administered 2020-07-11: 0.1 mg via ORAL

## 2020-07-11 NOTE — Telephone Encounter (Signed)
Pt said CP started 2 days with dull middle of chest continuous with any stress but if resting pt said the pain subsides. Pain does not radiate to other parts of body. No vomiting and usually has nausea on and off so not unusual for pt; pt has SOB when pt having CP. Pt said she feels chest congestion but usually has that due to allergies. No cough. Pt has mitral valve prolapse. Pt said is presently having the mid dull chest pain and pts husband will take pt to UC now for eval and testing. Pt left 07/16/20 appt for FU and pt will cb on 07/12/20 eoyj update. Pt lives 1 1/2 hrs from [facility].

## 2020-07-11 NOTE — ED Provider Notes (Signed)
[facility]    CSN: [id number] Arrival date & time: 07/11/20  1347      History   Chief Complaint Chief Complaint  Patient presents with  . Chest Pain    HPI Carolyn Matthews is a 65 y.o. female who present with onset of substernal chest pain every time she feels stressed. She describes the pain as sharp lasting several minutes til she relaxes and as it eases gets dull. She has to lay down and try to keep her mind on other things and subsidies. Has hx of mitral valve prolapse and has not had an echo in several years. Her BP normally runs systolic In the 120's She denies sweats or nausea during those episodes, but has felt palpitations.     Past Medical History:  Diagnosis Date  . Allergy    seasonal  . Anemia   . Chronic headaches   . Family history of breast cancer in female    Daughter; gene neg per pt  . Fibrocystic breast disease   . GERD (gastroesophageal reflux disease)   . Hyperglycemia    Diet controlled  . MVP (mitral valve prolapse)   . Seasonal allergies   . 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 Allergic rhinitis 04/06/2020  . OSA (obstructive sleep apnea) 01/20/2020  . Chest discomfort 03/21/2018  . Leg cramps 03/19/2018  . GERD (gastroesophageal reflux disease) 03/19/2018  . Lumbar degenerative disc disease 12/08/2016  . Lumbar back pain with radiculopathy affecting left lower extremity 12/02/2016  . Patellar subluxation, left, initial encounter 06/23/2016  . Vitamin D deficiency 10/03/2015  . Encounter for vitamin deficiency screening 11/14/2014  . Thoracic back pain 01/13/2014  . Colon cancer screening 08/11/2012  . 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 Anxiety disorder 02/26/2011  . 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 Other screening mammogram 08/28/2010  . Routine general medical examination at a health care facility 05/23/2010  . MVP (mitral valve prolapse)   .  Chronic headaches   . Seasonal allergies   . Family history of breast cancer in female   . Prediabetes 04/04/2009  . Hyperlipidemia 11/22/2008  . Anemia, unspecified 02/12/2007  . OVERACTIVE BLADDER 01/11/2007  . FIBROCYSTIC BREAST DISEASE 01/11/2007    Past Surgical History:  Procedure Laterality Date  . ANKLE FRACTURE SURGERY  05/2003  . BREAST BIOPSY  03/2004   Negative  . BREAST BIOPSY Right 12/27/2014   fat necrosis / fibrocystic changes  . BREAST CYST ASPIRATION  05/2007   Bilateral-benign  . BREAST LUMPECTOMY  12/1977  . TOTAL ABDOMINAL HYSTERECTOMY  09/1996   Fibroids    OB History    Gravida  2   Para  2   Term      Preterm      AB      Living  2     SAB      IAB      Ectopic      Multiple      Live Births               Home Medications    Prior to Admission medications   Medication Sig Start Date End Date Taking? Authorizing Provider  hydrOXYzine (ATARAX/VISTARIL) 25 MG tablet 1-2 q 8h prn anxiety and panic 07/11/20  Yes Rodriguez-Southworth, Nettie Elm, PA-C  conjugated estrogens (PREMARIN) vaginal cream Place 1 Applicatorful vaginally 2 (two) times a week. 0.5 gram vaginally at  bedtime twice weekly 10/31/19   Conard Novak, MD  COVID-19 mRNA vaccine, Pfizer, 30 MCG/0.3ML injection INJECT AS DIRECTED 02/27/20 02/26/21  Judyann Munson, MD  fluticasone [facility]) 50 MCG/ACT nasal spray USE 2 SPRAYS IN [facility] NOSTRIL ONCE A DAY 01/20/20 01/19/21  Tower, Audrie Gallus, MD  gabapentin (NEURONTIN) 300 MG capsule TAKE 1 CAPSULE BY MOUTH 3 TIMES DAILY Patient taking differently: Take 300 mg by mouth at bedtime. 04/06/19 04/05/20  Judi Saa, DO  gentamicin ointment (GARAMYCIN) 0.1 % APPLY TO NOSE THREE TIMES DAILY 04/18/20 04/18/21  Linus Salmons, MD  Multiple Vitamin (MULTIVITAMIN) tablet Take 1 tablet by mouth daily.    [provider]  sertraline (ZOLOFT) 50 MG tablet TAKE 1/2 TABLET BY MOUTH ONCE A DAY 01/20/20 01/19/21  Tower, Audrie Gallus, MD     Family History Family History  Problem Relation Age of Onset  . Diabetes Father   . Hypertension Mother   . Breast cancer Daughter        (inflammatory)-now has brain tumor  . Diabetes Paternal Grandmother   . Breast cancer Other        mat great aunt  . Breast cancer Daughter        2nd daughter with breast cancer   . Colon cancer Neg Hx   . Rectal cancer Neg Hx   . Stomach cancer Neg Hx   . Esophageal cancer Neg Hx     Social History Social History   Tobacco Use  . Smoking status: Never Smoker  . Smokeless tobacco: Never Used  Vaping Use  . Vaping Use: Never used  Substance Use Topics  . Alcohol use: Yes    Alcohol/week: 0.0 standard drinks    Comment: occasional use  . Drug use: No     Allergies   Eggs or egg-derived products and Hepatitis a vaccine   Review of Systems Review of Systems  Gastrointestinal:       + hx of heart burn  + sharp chest pain, the rest is neg.    Physical Exam Triage Vital Signs ED Triage Vitals  Enc Vitals Group     BP 07/11/20 1405 (!) 154/69     Pulse Rate 07/11/20 1405 68     Resp 07/11/20 1405 17     Temp 07/11/20 1405 98.4 F (36.9 C)     Temp Source 07/11/20 1405 Oral     SpO2 07/11/20 1405 97 %     Weight 07/11/20 1407 177 lb (80.3 kg)     Height 07/11/20 1407 5\' 4"  (1.626 m)     Head Circumference --      Peak Flow --      Pain Score 07/11/20 1407 3     Pain Loc --      Pain Edu? --      Excl. in GC? --    No data found.  Updated Vital Signs BP (!) 150/73 (BP Location: Right Arm)   Pulse 68   Temp 98.4 F (36.9 C) (Oral)   Resp 17   Ht 5\' 4"  (1.626 m)   Wt 177 lb (80.3 kg)   SpO2 97%   BMI 30.38 kg/m   Visual Acuity Right Eye Distance:   Left Eye Distance:   Bilateral Distance:    Right Eye Near:   Left Eye Near:    Bilateral Near:     Physical Exam Vitals reviewed.  Constitutional:      General: She is not in acute distress.  Appearance: She is not toxic-appearing.  HENT:      Head: Normocephalic.     Right Ear: External ear normal.     Left Ear: External ear normal.  Eyes:     General: No scleral icterus.    Conjunctiva/sclera: Conjunctivae normal.  Neck:     Vascular: No carotid bruit.  Cardiovascular:     Rate and Rhythm: Normal rate and regular rhythm.     Comments: I did not hear a murmur Pulmonary:     Effort: Pulmonary effort is normal.  Chest:     Chest wall: Tenderness present.     Comments: On R mid substernal region Abdominal:     General: Bowel sounds are normal.     Palpations: Abdomen is soft. There is no mass.     Tenderness: There is no abdominal tenderness.  Musculoskeletal:        General: Normal range of motion.     Cervical back: Neck supple.  Skin:    General: Skin is warm and dry.     Findings: No rash.  Neurological:     General: No focal deficit present.     Mental Status: She is alert and oriented to person, place, and time.  Psychiatric:        Mood and Affect: Mood is anxious.        Behavior: Behavior normal.        Thought Content: Thought content normal.        Judgment: Judgment normal.      UC Treatments / Results  Labs (all labs ordered are listed, but only abnormal results are displayed) Labs Reviewed - No data to display  EKG NSR with possible L atrial enlargement.  Borderline EKG. This is a change from EKG from 2016.   Radiology No results found.  Procedures Procedures (including critical care time)  Medications Ordered in UC Medications  cloNIDine (CATAPRES) tablet 0.1 mg (0.1 mg Oral Given 07/11/20 1458)    Initial Impression / Assessment and Plan / UC Course  I have reviewed the triage vital signs and the nursing notes. Pertinent  test results that were available during my care of the patient were reviewed by me and considered in my medical decision making (see chart for details).  She was given Clonidine 0.1 mg PO which she felt it helped a little. I told her I believe she has anxiety and I  prescribed her Hydroxizine as noted for prn anxiety. Needs to have echo done.  Has apt with PCP next week and will mention that. Since her chest wall is tender and is similar soreness, she also has costochondritis and may take Tylenol for this since I dont want her on NSAID's due to BP elevation.  See instructions.   Final Clinical Impressions(s) / UC Diagnoses   Final diagnoses:  Generalized anxiety disorder  Costochondritis     Discharge Instructions     Stop the zyrtec while taking anxiety medication If you get worse please go to the ER Keep your apt with your PCP next week. For the chest wall discomfort you may take Tylenol. Do blood pressure diaries and take those reading to your PCP on Monday      ED Prescriptions    Medication Sig Dispense Auth. Provider   hydrOXYzine (ATARAX/VISTARIL) 25 MG tablet 1-2 q 8h prn anxiety and panic 30 tablet Rodriguez-Southworth, Nettie Elm, PA-C     PDMP not reviewed this encounter.   Garey Ham, [place] 07/11/20 2102

## 2020-07-11 NOTE — Discharge Instructions (Addendum)
Stop the zyrtec while taking anxiety medication If you get worse please go to the ER Keep your apt with your PCP next week. For the chest wall discomfort you may take Tylenol. Do blood pressure diaries and take those reading to your PCP on Monday

## 2020-07-11 NOTE — Telephone Encounter (Signed)
Cindy at front desk received my chart notice pt scheduled my chart in office appt with Dr Milinda Antis on 07/16/20 at 12 noon with note of chest pain. I tried all 3 contact #s and left v/m for pt to cb on cell #.

## 2020-07-11 NOTE — ED Triage Notes (Signed)
Pt reports 3 days of CP intermittent and mostly when she is feeling stressed. No radiation. Occasional mild SOB. Located in central chest and sometimes is sharp in nature.

## 2020-07-11 NOTE — Telephone Encounter (Signed)
Aware and will watch for correspondence 

## 2020-07-12 ENCOUNTER — Encounter: Payer: Self-pay | Admitting: Family Medicine

## 2020-07-13 NOTE — Progress Notes (Signed)
[facility] [address] [place] [address] Phone: [phone] Subjective:   I Carolyn Matthews am serving as a Neurosurgeon for Dr. Antoine Primas.  This visit occurred during the SARS-CoV-2 public health emergency.  Safety protocols were in place, including screening questions prior to the visit, additional usage of staff PPE, and extensive cleaning of exam room while observing appropriate contact time as indicated for disinfecting solutions.   I'm seeing this patient by the request  of:  Tower, Audrie Gallus, MD  CC: Low back pain follow-up  GYB:[id number]  05/18/2019 Patient is doing relatively well with conservative therapy and the gabapentin.  At this point patient will continue with the gabapentin at night.  Discussed home exercises and icing regimen.  Worsening symptoms we can always consider the epidural.  Continue the vitamin supplements follow-up again in multiple months  Update 07/16/2020 Carolyn Matthews is a 65 y.o. female coming in with complaint of lumbar, DDD. Patient requested refill of 300mg  gabapentin and is here to discuss continuation of the medication. Patient states her back has been doing ok lately. Bilateral pain in the legs. Numbness. States it feels like the circulation in her legs is poor and at night she can't lay a certain way.  Patient has had a past medical history significant for mild what seemed to be more neurogenic claudication but patient is concerned that it is more of the circulation at this time.  Feels like her feet are cold sometimes.  History of ankle surgery fracture in 2005     Past Medical History:  Diagnosis Date   Allergy    seasonal   Anemia    Chronic headaches    Family history of breast cancer in female    Daughter; gene neg per pt   Fibrocystic breast disease    GERD (gastroesophageal reflux disease)    Hyperglycemia    Diet controlled   MVP (mitral valve prolapse)    Seasonal allergies    Traumatic  arthritis of ankle 11/14/2010   Past Surgical History:  Procedure Laterality Date   ANKLE FRACTURE SURGERY  05/2003   BREAST BIOPSY  03/2004   Negative   BREAST BIOPSY Right 12/27/2014   fat necrosis / fibrocystic changes   BREAST CYST ASPIRATION  05/2007   Bilateral-benign   BREAST LUMPECTOMY  12/1977   TOTAL ABDOMINAL HYSTERECTOMY  09/1996   Fibroids   Social History   Socioeconomic History   Marital status: Married    Spouse name: Not on file   Number of children: 2   Years of education: Not on file   Highest education level: Not on file  Occupational History   Occupation: 10/1996: [employer]    Comment: [employer]    Employer: [employer]  Tobacco Use   Smoking status: Never   Smokeless tobacco: Never  Vaping Use   Vaping Use: Never used  Substance and Sexual Activity   Alcohol use: Yes    Alcohol/week: 0.0 standard drinks    Comment: occasional use   Drug use: No   Sexual activity: Yes    Birth control/protection: Surgical    Comment: Hysterectomy  Other Topics Concern   Not on file  Social History Narrative   Not on file   Social Determinants of Health   Financial Resource Strain: Not on file  Food Insecurity: Not on file  Transportation Needs: Not on file  Physical Activity: Not on file  Stress: Not on file  Social Connections: Not on file   Allergies  Allergen Reactions   Eggs Or Egg-Derived Products Other (See Comments)    REACTION: SEVERE REACTION PV 4-22 pt states she gets flu like symptoms all but fever, no anaphylasis   Hepatitis A Vaccine Other (See Comments)    ? Allergic reaction; itching,weakness almost immediate after taking 1st vaccine   Family History  Problem Relation Age of Onset   Diabetes Father    Hypertension Mother    Breast cancer Daughter        (inflammatory)-now has brain tumor   Diabetes Paternal Grandmother    Breast cancer Other        mat great aunt   Breast cancer Daughter         2nd daughter with breast cancer    Colon cancer Neg Hx    Rectal cancer Neg Hx    Stomach cancer Neg Hx    Esophageal cancer Neg Hx       Current Outpatient Medications (Respiratory):    fluticasone (FLONASE) 50 MCG/ACT nasal spray, USE 2 SPRAYS IN EACH NOSTRIL ONCE A DAY    Current Outpatient Medications (Other):    conjugated estrogens (PREMARIN) vaginal cream, Place 1 Applicatorful vaginally 2 (two) times a week. 0.5 gram vaginally at bedtime twice weekly   COVID-19 mRNA vaccine, Pfizer, 30 MCG/0.3ML injection, INJECT AS DIRECTED   gabapentin (NEURONTIN) 300 MG capsule, Take 1 capsule (300 mg total) by mouth at bedtime.   gentamicin ointment (GARAMYCIN) 0.1 %, APPLY TO NOSE THREE TIMES DAILY   hydrOXYzine (ATARAX/VISTARIL) 25 MG tablet, 1-2 q 8h prn anxiety and panic   Multiple Vitamin (MULTIVITAMIN) tablet, Take 1 tablet by mouth daily.   sertraline (ZOLOFT) 50 MG tablet, TAKE 1/2 TABLET BY MOUTH ONCE A DAY   gabapentin (NEURONTIN) 300 MG capsule, TAKE 1 CAPSULE BY MOUTH 3 TIMES DAILY (Patient taking differently: Take 300 mg by mouth at bedtime.)   Reviewed prior external information including notes and imaging from  primary care provider As well as notes that were available from care everywhere and other healthcare systems.  Past medical history, social, surgical and family history all reviewed in electronic medical record.  No pertanent information unless stated regarding to the chief complaint.   Review of Systems:  No headache, visual changes, nausea, vomiting, diarrhea, constipation, dizziness, abdominal pain, skin rash, fevers, chills, night sweats, weight loss, swollen lymph nodes, body aches, joint swelling, chest pain, shortness of breath, mood changes. POSITIVE muscle aches, muscle cramps  Objective  Blood pressure 130/82, pulse (!) 57, height 5\' 4"  (1.626 m), weight 182 lb (82.6 kg), SpO2 98 %.   General: No apparent distress alert and oriented x3 mood and affect  normal, dressed appropriately.  HEENT: Pupils equal, extraocular movements intact  Respiratory: Patient's speak in full sentences and does not appear short of breath  Cardiovascular: No lower extremity edema, non tender, no erythema  Gait normal with good balance and coordination.  MSK: Bilateral lower extremities show the patient does have some arthritic changes of the ankle with pes planus noted.  Patient does have good dorsalis pedis pulses noted.  Patient does have maybe 1+ of the posterior tibialis.  Neurovascularly intact distally.  Tightness with straight leg test noted.    Impression and Recommendations:     The above documentation has been reviewed and is accurate and complete , DO

## 2020-07-16 ENCOUNTER — Other Ambulatory Visit (HOSPITAL_COMMUNITY): Payer: Self-pay

## 2020-07-16 ENCOUNTER — Ambulatory Visit (INDEPENDENT_AMBULATORY_CARE_PROVIDER_SITE_OTHER)
Admission: RE | Admit: 2020-07-16 | Discharge: 2020-07-16 | Disposition: A | Discharge status: Home / Self Care | Payer: No Typology Code available for payment source | Source: Ambulatory Visit | Attending: Family Medicine | Admitting: Family Medicine

## 2020-07-16 ENCOUNTER — Encounter: Payer: Self-pay | Admitting: Family Medicine

## 2020-07-16 ENCOUNTER — Ambulatory Visit (INDEPENDENT_AMBULATORY_CARE_PROVIDER_SITE_OTHER): Payer: No Typology Code available for payment source | Admitting: Family Medicine

## 2020-07-16 ENCOUNTER — Ambulatory Visit (INDEPENDENT_AMBULATORY_CARE_PROVIDER_SITE_OTHER): Payer: No Typology Code available for payment source

## 2020-07-16 ENCOUNTER — Ambulatory Visit: Payer: No Typology Code available for payment source | Admitting: Family Medicine

## 2020-07-16 ENCOUNTER — Other Ambulatory Visit: Payer: Self-pay

## 2020-07-16 VITALS — BP 140/80 | HR 71 | Temp 96.9°F | Ht 64.0 in | Wt 181.4 lb

## 2020-07-16 VITALS — BP 130/82 | HR 57 | Ht 64.0 in | Wt 182.0 lb

## 2020-07-16 DIAGNOSIS — M2550 Pain in unspecified joint: Secondary | ICD-10-CM | POA: Diagnosis not present

## 2020-07-16 DIAGNOSIS — M5416 Radiculopathy, lumbar region: Secondary | ICD-10-CM | POA: Diagnosis not present

## 2020-07-16 DIAGNOSIS — R252 Cramp and spasm: Secondary | ICD-10-CM | POA: Diagnosis not present

## 2020-07-16 DIAGNOSIS — I341 Nonrheumatic mitral (valve) prolapse: Secondary | ICD-10-CM | POA: Diagnosis not present

## 2020-07-16 DIAGNOSIS — F411 Generalized anxiety disorder: Secondary | ICD-10-CM

## 2020-07-16 DIAGNOSIS — R0789 Other chest pain: Secondary | ICD-10-CM | POA: Diagnosis not present

## 2020-07-16 LAB — VITAMIN B12: Vitamin B-12: 1439 pg/mL — ABNORMAL HIGH (ref 211–911)

## 2020-07-16 LAB — CBC WITH DIFFERENTIAL/PLATELET
Basophils Absolute: 0 10*3/uL (ref 0.0–0.1)
Basophils Relative: 0.4 % (ref 0.0–3.0)
Eosinophils Absolute: 0.2 10*3/uL (ref 0.0–0.7)
Eosinophils Relative: 2.3 % (ref 0.0–5.0)
HCT: 37.2 % (ref 36.0–46.0)
Hemoglobin: 12.1 g/dL (ref 12.0–15.0)
Lymphocytes Relative: 25.6 % (ref 12.0–46.0)
Lymphs Abs: 2.1 10*3/uL (ref 0.7–4.0)
MCHC: 32.4 g/dL (ref 30.0–36.0)
MCV: 91.4 fl (ref 78.0–100.0)
Monocytes Absolute: 0.4 10*3/uL (ref 0.1–1.0)
Monocytes Relative: 4.5 % (ref 3.0–12.0)
Neutro Abs: 5.5 10*3/uL (ref 1.4–7.7)
Neutrophils Relative %: 67.2 % (ref 43.0–77.0)
Platelets: 353 10*3/uL (ref 150.0–400.0)
RBC: 4.07 Mil/uL (ref 3.87–5.11)
RDW: 14.4 % (ref 11.5–15.5)
WBC: 8.2 10*3/uL (ref 4.0–10.5)

## 2020-07-16 LAB — FERRITIN: Ferritin: 149.5 ng/mL (ref 10.0–291.0)

## 2020-07-16 LAB — IBC PANEL
Iron: 69 ug/dL (ref 42–145)
Saturation Ratios: 17.8 % — ABNORMAL LOW (ref 20.0–50.0)
Transferrin: 277 mg/dL (ref 212.0–360.0)

## 2020-07-16 LAB — VITAMIN D 25 HYDROXY (VIT D DEFICIENCY, FRACTURES): VITD: 33.84 ng/mL (ref 30.00–100.00)

## 2020-07-16 LAB — TSH: TSH: 1.95 u[IU]/mL (ref 0.35–4.50)

## 2020-07-16 MED ORDER — GABAPENTIN 300 MG PO CAPS
300.0000 mg | ORAL_CAPSULE | Freq: Every day | ORAL | 3 refills | Status: DC
  Filled 2020-07-16: qty 90, 90d supply, fill #0
  Filled 2020-10-17: qty 90, 90d supply, fill #1
  Filled 2021-02-08: qty 90, 90d supply, fill #2

## 2020-07-16 NOTE — Patient Instructions (Addendum)
Try a tums or pepcid (10 or 20 mg) over the counter just to see if it wipes out the chest discomfort  (if it helps let us know)   Let's set you up with cardiology  You will get a call   The hydroxyzine is ok to take for anxiety as needed (you can take 1/2 or 1 or 2 pills per dose)   If you want to increase zoloft to 50 mg daily let me know and we will change the px   If symptoms suddenly worsen- go to ER and let us know

## 2020-07-16 NOTE — Addendum Note (Signed)
Addended by: Madelon Lips on: 07/16/2020 11:13 AM   Modules accepted: Orders

## 2020-07-16 NOTE — Assessment & Plan Note (Signed)
Recent sub sternal discomfort and feeling of fast HR (worse when stressed)  Has had echo once before-not in chart Ref to cardiology for consultation

## 2020-07-16 NOTE — Patient Instructions (Addendum)
Good to see you Carolyn Matthews bilaterally call [phone] to schedule Labs today  Back xray today Refilled gabapentin 300 at night See me again in 6 weeks

## 2020-07-16 NOTE — Assessment & Plan Note (Signed)
Definitely linked to stress/anxiety and now also some with movement  No longer has tenderness Reviewed her UC visit in detail with EKG  Improved bp today- need to watch however  Last LDL of 129 cxr ordered today-pending Plan to ref to cardiology for further eval (also for MVP) Will work on tx for anxiety and stress reaction as well

## 2020-07-16 NOTE — Progress Notes (Signed)
Subjective:    Patient ID: Carolyn Matthews, female    DOB: 01-11-1956, 65 y.o.   MRN: [id number]  This visit occurred during the SARS-CoV-2 public health emergency.  Safety protocols were in place, including screening questions prior to the visit, additional usage of staff PPE, and extensive cleaning of exam room while observing appropriate contact time as indicated for disinfecting solutions.   HPI Pt presents for c/o chest pain (urgent care visit f/u)  Wt Readings from Last 3 Encounters:  07/16/20 181 lb 6 oz (82.3 kg)  07/16/20 182 lb (82.6 kg)  07/11/20 177 lb (80.3 kg)   31.13 kg/m  Pt was seen in UC in [place] on 6/8  Presented with substernal cp triggered by stress Sharp lasting several minutes at a time until she settles down  Helped to lie down/relax  Mentions occ palpitations   Pt notes that she was working /multitasking when it happened this last time  A little tender in chest wall   H/o MVP noted   Her EKG notes NSR with poss L atrial enl (no change from2016)  A/P:  She was given Clonidine 0.1 mg PO which she felt it helped a little. I told her I believe she has anxiety and I prescribed her Hydroxizine as noted for prn anxiety. Needs to have echo done.  Has apt with PCP next week and will mention that. Since her chest wall is tender and is similar soreness, she also has costochondritis and may take Tylenol for this since I dont want her on NSAID's due to BP elevation. See instructions.   Tenderness is better now  She gets the discomfort when she moves  Worse with more exertion  Had one episode of sob (some discomfort to take a deep breath)  No nausea or clamminess No radiation  At times sharp/otherwise dull  Still responds to feelings of stress also   Palpitations - felt like her heartbeat was fast   Some leg pain-sees [facility] in [facility]  Getting ABI Pending  B12 and tsh, D and iron/cbc/ferritin  BP BP Readings from Last 3 Encounters:  07/16/20  140/80  07/16/20 130/82  07/11/20 (!) 150/73    Pulse Readings from Last 3 Encounters:  07/16/20 71  07/16/20 (!) 57  07/11/20 68   At home bp varies - avg 130s on top  70s on bottom  Several with 150   HTN - mother   Anxiety- she tried hydroxyzine Helps quite a bit with the chest pain but does not get rid of it totally  Work is generally ok , some bad days   Lab Results  Component Value Date   CHOL 190 04/18/2020   HDL 47.90 04/18/2020   LDLCALC 129 (H) 04/18/2020   TRIG 67.0 04/18/2020   CHOLHDL 4 04/18/2020   The 10-year ASCVD risk score Denman George(Goff DC Jr., et al., 2013) is: 8.4%   Values used to calculate the score:     Age: [age] years     Sex: Female     Is Non-Hispanic African American: Yes     Diabetic: No     Tobacco smoker: No     Systolic Blood Pressure: 140 mmHg     Is BP treated: No     HDL Cholesterol: 47.9 mg/dL     Total Cholesterol: 190 mg/dL  Patient Active Problem List   Diagnosis Date Noted   Allergic rhinitis 04/06/2020   OSA (obstructive sleep apnea) 01/20/2020   Chest discomfort 03/21/2018  Leg cramps 03/19/2018   GERD (gastroesophageal reflux disease) 03/19/2018   Lumbar degenerative disc disease 12/08/2016   Lumbar back pain with radiculopathy affecting left lower extremity 12/02/2016   Patellar subluxation, left, initial encounter 06/23/2016   Vitamin D deficiency 10/03/2015   Encounter for vitamin deficiency screening 11/14/2014   Thoracic back pain 01/13/2014   Colon cancer screening 08/11/2012   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   Anxiety disorder 02/26/2011   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   Other screening mammogram 08/28/2010   Routine general medical examination at a health care facility 05/23/2010   MVP (mitral valve prolapse)    Chronic headaches    Seasonal allergies    Family history of breast cancer in female    Prediabetes 04/04/2009   Hyperlipidemia 11/22/2008   Anemia, unspecified 02/12/2007   OVERACTIVE  BLADDER 01/11/2007   FIBROCYSTIC BREAST DISEASE 01/11/2007   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   Past Surgical History:  Procedure Laterality Date   ANKLE FRACTURE SURGERY  05/2003   BREAST BIOPSY  03/2004   Negative   BREAST BIOPSY Right 12/27/2014   fat necrosis / fibrocystic changes   BREAST CYST ASPIRATION  05/2007   Bilateral-benign   BREAST LUMPECTOMY  12/1977   TOTAL ABDOMINAL HYSTERECTOMY  09/1996   Fibroids   Social History   Tobacco Use   Smoking status: Never   Smokeless tobacco: Never  Vaping Use   Vaping Use: Never used  Substance Use Topics   Alcohol use: Yes    Alcohol/week: 0.0 standard drinks    Comment: occasional use   Drug use: No   Family History  Problem Relation Age of Onset   Diabetes Father    Hypertension Mother    Breast cancer Daughter        (inflammatory)-now has brain tumor   Diabetes Paternal Grandmother    Breast cancer Other        mat great aunt   Breast cancer Daughter        2nd daughter with breast cancer    Colon cancer Neg Hx    Rectal cancer Neg Hx    Stomach cancer Neg Hx    Esophageal cancer Neg Hx    Allergies  Allergen Reactions   Eggs Or Egg-Derived Products Other (See Comments)    REACTION: SEVERE REACTION PV 4-22 pt states she gets flu like symptoms all but fever, no anaphylasis   Hepatitis A Vaccine Other (See Comments)    ? Allergic reaction; itching,weakness almost immediate after taking 1st vaccine   Current Outpatient Medications on File Prior to Visit  Medication Sig Dispense Refill   conjugated estrogens (PREMARIN) vaginal cream Place 1 Applicatorful vaginally 2 (two)  times a week. 0.5 gram vaginally at bedtime twice weekly 30 g 3   COVID-19 mRNA vaccine, Pfizer, 30 MCG/0.3ML injection INJECT AS DIRECTED .3 mL 0   fluticasone (FLONASE) 50 MCG/ACT nasal spray USE 2 SPRAYS IN EACH NOSTRIL ONCE A DAY 48 g 3   gabapentin (NEURONTIN) 300 MG capsule Take 1 capsule (300 mg total) by  mouth at bedtime. 90 capsule 3   gentamicin ointment (GARAMYCIN) 0.1 % APPLY TO NOSE THREE TIMES DAILY 30 g 12   hydrOXYzine (ATARAX/VISTARIL) 25 MG tablet 1-2 q 8h prn anxiety and panic 30 tablet 0   Multiple Vitamin (MULTIVITAMIN) tablet Take 1 tablet by mouth daily.     sertraline (ZOLOFT) 50 MG tablet TAKE 1/2 TABLET BY MOUTH ONCE A DAY 45 tablet 3   No current facility-administered medications on file prior to visit.    Review of Systems  Constitutional:  Positive for fatigue. Negative for activity change, appetite change, fever and unexpected weight change.  HENT:  Negative for congestion, ear pain, rhinorrhea, sinus pressure and sore throat.   Eyes:  Negative for pain, redness and visual disturbance.  Respiratory:  Negative for cough, choking, chest tightness, shortness of breath, wheezing and stridor.   Cardiovascular:  Positive for chest pain. Negative for palpitations and leg swelling.  Gastrointestinal:  Negative for abdominal pain, blood in stool, constipation and diarrhea.  Endocrine: Negative for polydipsia and polyuria.  Genitourinary:  Negative for dysuria, frequency and urgency.  Musculoskeletal:  Negative for arthralgias, back pain and myalgias.  Skin:  Negative for pallor and rash.  Allergic/Immunologic: Negative for environmental allergies.  Neurological:  Negative for dizziness, syncope and headaches.       ? Restless legs-worse on the L Has ABI pending and labs  Hematological:  Negative for adenopathy. Does not bruise/bleed easily.  Psychiatric/Behavioral:  Positive for dysphoric mood. Negative for decreased concentration. The patient is nervous/anxious.        Objective:   Physical Exam Constitutional:      General: She is not in acute distress.    Appearance: Normal appearance. She is well-developed. She is obese. She is not ill-appearing or diaphoretic.  HENT:     Head: Normocephalic and atraumatic.  Eyes:     Conjunctiva/sclera: Conjunctivae normal.     Pupils: Pupils are equal, round, and reactive to light.  Neck:     Thyroid: No thyromegaly.     Vascular: No carotid bruit or JVD.  Cardiovascular:     Rate and Rhythm: Normal rate and regular rhythm.     Heart sounds: Normal heart sounds.    No gallop.  Pulmonary:     Effort: Pulmonary effort is normal. No respiratory distress.     Breath sounds: Normal breath sounds. No stridor. No wheezing, rhonchi or rales.  Chest:     Chest wall: No tenderness.  Abdominal:     General: Bowel sounds are normal. There is no distension or abdominal bruit.     Palpations: Abdomen is soft. There is no mass.     Tenderness: There is no abdominal tenderness. There is no right CVA tenderness, left CVA tenderness, guarding or rebound.  Musculoskeletal:     Cervical back: Normal range of motion and neck supple.     Right lower leg: No edema.     Left lower leg: No edema.  Lymphadenopathy:     Cervical: No cervical adenopathy.  Skin:    General: Skin is warm and dry.     Coloration: Skin is not pale.     Findings: No rash.  Neurological:     Mental Status: She is alert.     Cranial Nerves: No cranial nerve deficit.     Sensory: No sensory deficit.     Coordination: Coordination normal.     Deep Tendon Reflexes: Reflexes are normal and symmetric. Reflexes normal.  Psychiatric:  Mood and Affect: Mood normal.     Comments: Seems mildly stressed Pleasant and not overly anx          Assessment & Plan:   Problem List Items Addressed This Visit       Cardiovascular and Mediastinum   MVP (mitral valve prolapse)    Recent sub sternal discomfort and feeling of fast HR (worse when  stressed)  Has had echo once before-not in chart Ref to cardiology for consultation        Relevant Orders   Ambulatory referral to Cardiology     Other   Anxiety disorder    Worse recently after bad work day, also caring for elderly mother  Disc imp of self care  Reviewed stressors/ coping techniques/symptoms/ support sources/ tx options and side effects in detail today  Enc to consider inc zoloft to 50 (pt plans to try and update for new px if needed)  Hydroxyzine 25 mg has helped prn (sedating)  Counseling offered/may be helpful        Chest discomfort - Primary    Definitely linked to stress/anxiety and now also some with movement  No longer has tenderness Reviewed her UC visit in detail with EKG  Improved bp today- need to watch however  Last LDL of 129 cxr ordered today-pending Plan to ref to cardiology for further eval (also for MVP) Will work on tx for anxiety and stress reaction as well       Relevant Orders   DG Chest 2 View   Ambulatory referral to Cardiology

## 2020-07-16 NOTE — Assessment & Plan Note (Signed)
Patient does have MRI from 2018 showing the patient did have a nerve root impingement noted that could be consistent with some of patient's leg pain.  Patient's pain was only at night.  We will get laboratory work-up to rule out iron deficiency anemia that could be contributing.  Patient's MCV is normal but has had a hemoglobin in the 11's for quite some time.  We will get an ABI to further evaluate the blood flow in the legs but I do anticipate being fairly normal.  Patient will follow up with me again in 6 weeks otherwise.

## 2020-07-16 NOTE — Assessment & Plan Note (Signed)
Worse recently after bad work day, also caring for elderly mother  Disc imp of self care  Reviewed stressors/ coping techniques/symptoms/ support sources/ tx options and side effects in detail today  Enc to consider inc zoloft to 50 (pt plans to try and update for new px if needed)  Hydroxyzine 25 mg has helped prn (sedating)  Counseling offered/may be helpful

## 2020-07-17 ENCOUNTER — Encounter: Payer: Self-pay | Admitting: Family Medicine

## 2020-07-17 ENCOUNTER — Other Ambulatory Visit: Payer: Self-pay | Admitting: Family Medicine

## 2020-07-17 DIAGNOSIS — I739 Peripheral vascular disease, unspecified: Secondary | ICD-10-CM

## 2020-07-17 DIAGNOSIS — R252 Cramp and spasm: Secondary | ICD-10-CM

## 2020-07-17 MED ORDER — SERTRALINE HCL 50 MG PO TABS
50.0000 mg | ORAL_TABLET | Freq: Every day | ORAL | 3 refills | Status: DC
  Filled 2020-07-17: qty 90, 90d supply, fill #0
  Filled 2020-10-17: qty 90, 90d supply, fill #1
  Filled 2020-12-04 – 2020-12-22 (×2): qty 90, 90d supply, fill #2

## 2020-07-17 MED ORDER — HYDROXYZINE HCL 25 MG PO TABS
ORAL_TABLET | ORAL | 3 refills | Status: DC
  Filled 2020-07-17: qty 30, 5d supply, fill #0
  Filled 2020-09-10: qty 30, 5d supply, fill #1

## 2020-07-18 ENCOUNTER — Other Ambulatory Visit (HOSPITAL_COMMUNITY): Payer: Self-pay

## 2020-07-27 ENCOUNTER — Ambulatory Visit (HOSPITAL_COMMUNITY): Admission: RE | Admit: 2020-07-27 | Payer: No Typology Code available for payment source | Source: Ambulatory Visit

## 2020-07-27 ENCOUNTER — Telehealth: Payer: Self-pay | Admitting: Family Medicine

## 2020-07-27 ENCOUNTER — Encounter (HOSPITAL_COMMUNITY): Payer: Self-pay

## 2020-07-27 NOTE — Telephone Encounter (Signed)
Dr. Katrinka Blazing said that patient is ok to wait until July 25th appt. Recommend that patient call for cancellations. Recommend checking at [facility] but she declines. Patient states that she will call for cancellations.

## 2020-07-27 NOTE — Telephone Encounter (Signed)
[facility] called, patient was late for her Imaging appt and had to be rescheduled. She is now scheduled for 7/25.  Per pt request, [facility] would like to know if we can get her in somewhere sooner, [facility] or [facility] hospital?.

## 2020-08-03 ENCOUNTER — Other Ambulatory Visit: Payer: Self-pay | Admitting: Family Medicine

## 2020-08-03 DIAGNOSIS — I739 Peripheral vascular disease, unspecified: Secondary | ICD-10-CM

## 2020-08-03 DIAGNOSIS — R252 Cramp and spasm: Secondary | ICD-10-CM

## 2020-08-08 ENCOUNTER — Telehealth: Payer: Self-pay | Admitting: *Deleted

## 2020-08-08 DIAGNOSIS — I341 Nonrheumatic mitral (valve) prolapse: Secondary | ICD-10-CM

## 2020-08-08 DIAGNOSIS — R0789 Other chest pain: Secondary | ICD-10-CM

## 2020-08-08 DIAGNOSIS — R42 Dizziness and giddiness: Secondary | ICD-10-CM | POA: Insufficient documentation

## 2020-08-08 NOTE — Telephone Encounter (Signed)
I put in echo referral   Looks like in epic her cardiology appt is 7/25 (if I am correct)   If symptoms become severe in the meantime go to the ER

## 2020-08-08 NOTE — Telephone Encounter (Signed)
Appt on 7/25 is just imaging that the sports med doc she sees ordered. Cardiologist appt isn't until 09/25/20.  Pt notified order in and ER precautions given, will route to referral pool so they are aware of referral

## 2020-08-08 NOTE — Telephone Encounter (Signed)
Pt is scheduled for ECHO on 08/29/20 at 2pm with [facility]  Per Dr Milinda Antis this is okay - this is the soonest appt. She was placed on the cancellation list   Nothing further needed.

## 2020-08-08 NOTE — Telephone Encounter (Signed)
Patient called stating that she can not get  in to see the cardiologist until next month. Patient stated that she has some new symptoms. Patient stated that she had an episode Monday while in a store. Patient stated that she almost passed out. Patient stated that she felt a rush come over her and felt dizzy and had some blurry vision. Patient stated that she is still having chest pain. Patient stated that the chest pain comes and goes. Patient stated that the chest pain seems to be more constant and only goes away when she lays down, rest and calms herself down. Patient stated when she went to an UC several weeks ago the doctor there told her that her PCP should order an ECHO. Patient wanted to know if Dr. Milinda Antis can go ahead and order it since she can not see the cardiologist until next month. Patient stated that she is on a cancellation list at the cardiology office. Patient was given ER precautions and she verbalized understanding.

## 2020-08-27 ENCOUNTER — Ambulatory Visit (INDEPENDENT_AMBULATORY_CARE_PROVIDER_SITE_OTHER): Payer: No Typology Code available for payment source

## 2020-08-27 ENCOUNTER — Other Ambulatory Visit: Payer: Self-pay

## 2020-08-27 DIAGNOSIS — R0789 Other chest pain: Secondary | ICD-10-CM

## 2020-08-27 DIAGNOSIS — R252 Cramp and spasm: Secondary | ICD-10-CM | POA: Diagnosis not present

## 2020-08-27 DIAGNOSIS — I739 Peripheral vascular disease, unspecified: Secondary | ICD-10-CM | POA: Diagnosis not present

## 2020-08-27 DIAGNOSIS — I341 Nonrheumatic mitral (valve) prolapse: Secondary | ICD-10-CM | POA: Diagnosis not present

## 2020-08-27 DIAGNOSIS — R42 Dizziness and giddiness: Secondary | ICD-10-CM | POA: Diagnosis not present

## 2020-08-27 LAB — ECHOCARDIOGRAM COMPLETE
Area-P 1/2: 2.39 cm2
Calc EF: 67.6 %
S' Lateral: 2.4 cm
Single Plane A2C EF: 58 %
Single Plane A4C EF: 77.9 %

## 2020-08-27 NOTE — Telephone Encounter (Signed)
I saw the phone notes concerning the Echo.  Had extra time today so I moved the appointment and did it today.  Sent Dr. Okey Dupre a message to read it today.

## 2020-08-29 ENCOUNTER — Ambulatory Visit: Payer: No Typology Code available for payment source | Admitting: Family Medicine

## 2020-08-29 ENCOUNTER — Ambulatory Visit: Payer: No Typology Code available for payment source

## 2020-08-29 ENCOUNTER — Other Ambulatory Visit: Payer: No Typology Code available for payment source

## 2020-09-03 ENCOUNTER — Ambulatory Visit (INDEPENDENT_AMBULATORY_CARE_PROVIDER_SITE_OTHER): Payer: No Typology Code available for payment source | Admitting: Cardiology

## 2020-09-03 ENCOUNTER — Telehealth: Payer: Self-pay | Admitting: *Deleted

## 2020-09-03 ENCOUNTER — Other Ambulatory Visit: Payer: Self-pay

## 2020-09-03 ENCOUNTER — Other Ambulatory Visit (HOSPITAL_COMMUNITY): Payer: Self-pay

## 2020-09-03 ENCOUNTER — Encounter: Payer: Self-pay | Admitting: Cardiology

## 2020-09-03 ENCOUNTER — Other Ambulatory Visit: Payer: Self-pay | Admitting: Family Medicine

## 2020-09-03 VITALS — BP 124/70 | HR 72 | Ht 64.0 in | Wt 179.0 lb

## 2020-09-03 DIAGNOSIS — K219 Gastro-esophageal reflux disease without esophagitis: Secondary | ICD-10-CM | POA: Diagnosis not present

## 2020-09-03 DIAGNOSIS — E7800 Pure hypercholesterolemia, unspecified: Secondary | ICD-10-CM | POA: Diagnosis not present

## 2020-09-03 DIAGNOSIS — R072 Precordial pain: Secondary | ICD-10-CM

## 2020-09-03 DIAGNOSIS — R0789 Other chest pain: Secondary | ICD-10-CM

## 2020-09-03 DIAGNOSIS — Z01818 Encounter for other preprocedural examination: Secondary | ICD-10-CM

## 2020-09-03 DIAGNOSIS — Z01812 Encounter for preprocedural laboratory examination: Secondary | ICD-10-CM

## 2020-09-03 DIAGNOSIS — Z1231 Encounter for screening mammogram for malignant neoplasm of breast: Secondary | ICD-10-CM

## 2020-09-03 MED ORDER — OMEPRAZOLE MAGNESIUM 20 MG PO TBEC
20.0000 mg | DELAYED_RELEASE_TABLET | Freq: Every day | ORAL | 3 refills | Status: DC
  Filled 2020-09-03: qty 30, 30d supply, fill #0

## 2020-09-03 MED ORDER — METOPROLOL TARTRATE 100 MG PO TABS
ORAL_TABLET | ORAL | 0 refills | Status: DC
  Filled 2020-09-03: qty 1, 1d supply, fill #0

## 2020-09-03 NOTE — Progress Notes (Signed)
Cardiology Office Note:    Date:  09/03/2020   ID:  Carolyn Matthews, DOB 26-May-1955, MRN [id number]  PCP:  Judy Pimple, MD   [facility] Providers Cardiologist:  None     Referring MD: Judy Pimple, MD   Chief Complaint  Patient presents with   Other    Referred by Pcp for Chest discomfort and MVP. Meds reviewed verbally with patient.    Carolyn Matthews is a 65 y.o. female who is being seen today for the evaluation of chest pain at the request of Tower, Audrie Gallus, MD.   History of Present Illness:    Carolyn Matthews is a 65 y.o. female with a hx of hyperlipidemia, GERD, who presents due to chest pain.  She complains of worsening chest pain over the past month.  Symptoms are not related with exertion.  Has a history of heartburn, current chest discomfort feels different.  Had an echocardiogram showing normal systolic function.  Denies smoking, denies previous history of heart disease.  She was told she had mitral valve prolapse in the past.  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 0.5 gram vaginally at bedtime twice weekly   COVID-19 mRNA vaccine, Pfizer, 30  MCG/0.3ML injection INJECT AS DIRECTED   fluticasone (FLONASE) 50 MCG/ACT nasal spray USE 2 SPRAYS IN EACH NOSTRIL ONCE A DAY   gabapentin (NEURONTIN) 300 MG capsule Take 1 capsule (300 mg total) by mouth at bedtime.   gentamicin ointment (GARAMYCIN) 0.1 % APPLY TO NOSE THREE TIMES DAILY   hydrOXYzine (ATARAX/VISTARIL) 25 MG tablet Take 1-2 tablets by mouth every 8 hours as needed for anxiety and panic   metoprolol tartrate (LOPRESSOR) 100 MG tablet Take 1 tablet by mouth 2 hours prior to your Cardiac CT   Multiple Vitamin (MULTIVITAMIN) tablet Take 1 tablet by mouth daily.   omeprazole (PRILOSEC OTC) 20 MG tablet Take 1 tablet (20 mg total) by mouth daily.   sertraline (ZOLOFT) 50 MG tablet Take 1 tablet (50 mg total) by mouth daily.     Allergies:   Eggs or egg-derived products and Hepatitis a vaccine   Social History   Socioeconomic History   Marital status: Married    Spouse name: Not on file   Number of children: 2   Years of education: Not on file   Highest education level: Not on file  Occupational History   Occupation: Chartered loss adjuster: [employer]    Comment: [place] [employer]    Employer: [employer]  Tobacco  Use   Smoking status: Never   Smokeless tobacco: Never  Vaping Use   Vaping Use: Never used  Substance and Sexual Activity   Alcohol use: Yes    Alcohol/week: 0.0 standard drinks    Comment: occasional use   Drug use: No   Sexual activity: Yes    Birth control/protection: Surgical    Comment: Hysterectomy  Other Topics Concern   Not on file  Social History Narrative   Not on file   Social Determinants of Health   Financial Resource Strain: Not on file  Food Insecurity: Not on file  Transportation Needs: Not on file  Physical Activity: Not on file  Stress: Not on file  Social Connections: Not on file     Family History: The patient's family history includes Breast cancer in her daughter, daughter and another family member; Diabetes  in her father and paternal grandmother; Hypertension in her mother. There is no history of Colon cancer, Rectal cancer, Stomach cancer, or Esophageal cancer.  ROS:   Please see the history of present illness.     All other systems reviewed and are negative.  EKGs/Labs/Other Studies Reviewed:    The following studies were reviewed today:   EKG:  EKG is  ordered today.  The ekg ordered today demonstrates normal sinus rhythm, nonspecific T wave abnormality.  Recent Labs: 01/20/2020: ALT 14; BUN 15; Creatinine, Ser 0.80; Potassium 4.4; Sodium 139 07/16/2020: Hemoglobin 12.1; Platelets 353.0; TSH 1.95  Recent Lipid Panel    Component Value Date/Time   CHOL 190 04/18/2020 1118   TRIG 67.0 04/18/2020 1118   HDL 47.90 04/18/2020 1118   CHOLHDL 4 04/18/2020 1118   VLDL 13.4 04/18/2020 1118   LDLCALC 129 (H) 04/18/2020 1118     Risk Assessment/Calculations:          Physical Exam:    VS:  BP 124/70 (BP Location: Left Arm, Patient Position: Sitting, Cuff Size: Normal)   Pulse 72   Ht 5\' 4"  (1.626 m)   Wt 179 lb (81.2 kg)   SpO2 98%   BMI 30.73 kg/m     Wt Readings from Last 3 Encounters:  09/03/20 179 lb (81.2 kg)  07/16/20 181 lb 6 oz (82.3 kg)  07/16/20 182 lb (82.6 kg)     GEN:  Well nourished, well developed in no acute distress HEENT: Normal NECK: No JVD; No carotid bruits LYMPHATICS: No lymphadenopathy CARDIAC: RRR, no murmurs, rubs, gallops RESPIRATORY:  Clear to auscultation without rales, wheezing or rhonchi  ABDOMEN: Soft, non-tender, non-distended MUSCULOSKELETAL:  No edema; No deformity  SKIN: Warm and dry NEUROLOGIC:  Alert and oriented x 3 PSYCHIATRIC:  Normal affect   ASSESSMENT:    1. Precordial pain   2. Pure hypercholesterolemia   3. Gastroesophageal reflux disease, unspecified whether esophagitis present   4. Pre-procedure lab exam    PLAN:    In order of problems listed above:  Chest pain, risk factors age, hyperlipidemia.  Echo shows  normal systolic function, no LVH noted upon my review.  Lateral wall thickness secondary to papillary muscle inclusion in measurements.  No evidence of mitral valve prolapse.  Obtain coronary CTA to evaluate presence of CAD. Hyperlipidemia, 10-year ASCVD risk 6.3%, not in statin benefit group, low-cholesterol diet advised. History of reflux, start Prilosec OTC 20 mg daily.  Follow-up after coronary CTA.      Medication Adjustments/Labs and Tests Ordered: Current medicines are reviewed at length with the patient today.  Concerns regarding medicines are outlined  above.  Orders Placed This Encounter  Procedures   CT CORONARY MORPH W/CTA COR W/SCORE W/CA W/CM &/OR WO/CM   Basic metabolic panel   EKG 12-Lead    Meds ordered this encounter  Medications   metoprolol tartrate (LOPRESSOR) 100 MG tablet    Sig: Take 1 tablet by mouth 2 hours prior to your Cardiac CT    Dispense:  1 tablet    Refill:  0   omeprazole (PRILOSEC OTC) 20 MG tablet    Sig: Take 1 tablet (20 mg total) by mouth daily.    Dispense:  30 tablet    Refill:  3     Patient Instructions  Medication Instructions:  - Your physician has recommended you make the following change in your medication:   1) START Over The Counter (OTC) prilosec 20 mg- take 1 tablet by mouth once daily   *If you need a refill on your cardiac medications before your next appointment, please call your pharmacy*   Lab Work: - Your physician recommends that you have lab work today: BMP  If you have labs (blood work) drawn today and your tests are completely normal, you will receive your results only by: MyChart Message (if you have MyChart) OR A paper copy in the mail If you have any lab test that is abnormal or we need to change your treatment, we will call you to review the results.   Testing/Procedures: - Your physician has requested that you have cardiac CT. Cardiac computed tomography (CT) is a painless test that uses an x-ray  machine to take clear, detailed pictures of your heart.      Your cardiac CT will be scheduled at one of the below locations:   [facility] [address] [place], [place] [address] [phone]  OR  [facility] [address] [place], [place] [address] [phone]  If scheduled at [facility], please arrive at the [facility] main entrance (entrance A) of [facility] 30 minutes prior to test start time. Proceed to the [facility] Radiology Department (first floor) to check-in and test prep.  If scheduled at [facility], please arrive 15 mins early for check-in and test prep.  Please follow these instructions carefully (unless otherwise directed):   On the Night Before the Test: Be sure to Drink plenty of water. Do not consume any caffeinated/decaffeinated beverages or chocolate 12 hours prior to your test. Do not take any antihistamines 12 hours prior to your test.   On the Day of the Test: Drink plenty of water until 1 hour prior to the test. Do not eat any food 4 hours prior to the test. You may take your regular medications prior to the test.  Take metoprolol (Lopressor) 100 mg two hours prior to test. FEMALES- please wear underwire-free bra if available, avoid dresses & tight clothing   After the Test: Drink plenty of water. After receiving IV contrast, you may experience a mild flushed feeling. This is normal. On occasion, you may experience a mild rash up to 24 hours after the test. This is not dangerous. If this occurs, you can take Benadryl 25 mg and increase your fluid intake. If you experience trouble breathing, this can be serious. If it is severe call 911 IMMEDIATELY. If it is mild, please call our office.   Please allow 2-4 weeks for scheduling of routine cardiac CTs. Some insurance companies require a pre-authorization which may delay  scheduling of this  test.   For non-scheduling related questions, please contact the cardiac imaging nurse navigator should you have any questions/concerns: Rockwell Alexandria, Cardiac Imaging Nurse Navigator Larey Brick, Cardiac Imaging Nurse Navigator [facility] Direct Office Dial: [phone]   For scheduling needs, including cancellations and rescheduling, please call [place], [phone].    Follow-Up: At [facility], you and your health needs are our priority.  As part of our continuing mission to provide you with exceptional heart care, we have created designated Provider Care Teams.  These Care Teams include your primary Cardiologist (physician) and Advanced Practice Providers (APPs -  Physician Assistants and Nurse Practitioners) who all work together to provide you with the care you need, when you need it.  We recommend signing up for the patient portal called "MyChart".  Sign up information is provided on this After Visit Summary.  MyChart is used to connect with patients for Virtual Visits (Telemedicine).  Patients are able to view lab/test results, encounter notes, upcoming appointments, etc.  Non-urgent messages can be sent to your provider as well.   To learn more about what you can do with MyChart, go to [email or web address].    Your next appointment:   4-5 week(s)  The format for your next appointment:   In Person  Provider:   You may see Debbe Odea, MD or one of the following Advanced Practice Providers on your designated Care Team:   Nicolasa Ducking, NP Eula Listen, PA-C Marisue Ivan, PA-C Cadence Fransico Michael, [place]   Other Instructions   Cardiac CT Angiogram A cardiac CT angiogram is a procedure to look at the heart and the area around the heart. It may be done to help find the cause of chest pains or other symptoms of heart disease. During this procedure, a substance called contrast dye is injected into the blood vessels in the area to be  checked. A large X-ray machine, called a CT scanner, then takes detailed pictures of the heart and the surrounding area. The procedure is also sometimes called a coronary CTangiogram, coronary artery scanning, or CTA. A cardiac CT angiogram allows the health care provider to see how well blood is flowing to and from the heart. The health care provider will be able to see if there are any problems, such as: Blockage or narrowing of the coronary arteries in the heart. Fluid around the heart. Signs of weakness or disease in the muscles, valves, and tissues of the heart. Tell a health care provider about: Any allergies you have. This is especially important if you have had a previous allergic reaction to contrast dye. All medicines you are taking, including vitamins, herbs, eye drops, creams, and over-the-counter medicines. Any blood disorders you have. Any surgeries you have had. Any medical conditions you have. Whether you are pregnant or may be pregnant. Any anxiety disorders, chronic pain, or other conditions you have that may increase your stress or prevent you from lying still. What are the risks? Generally, this is a safe procedure. However, problems may occur, including: Bleeding. Infection. Allergic reactions to medicines or dyes. Damage to other structures or organs. Kidney damage from the contrast dye that is used. Increased risk of cancer from radiation exposure. This risk is low. Talk with your health care provider about: The risks and benefits of testing. How you can receive the lowest dose of radiation. What happens before the procedure? Wear comfortable clothing and remove any jewelry, glasses, dentures, and hearing aids. Follow instructions from  your health care provider about eating and drinking. This may include: For 12 hours before the procedure -- avoid caffeine. This includes tea, coffee, soda, energy drinks, and diet pills. Drink plenty of water or other fluids that do  not have caffeine in them. Being well hydrated can prevent complications. For 4-6 hours before the procedure -- stop eating and drinking. The contrast dye can cause nausea, but this is less likely if your stomach is empty. Ask your health care provider about changing or stopping your regular medicines. This is especially important if you are taking diabetes medicines, blood thinners, or medicines to treat problems with erections (erectile dysfunction). What happens during the procedure?  Hair on your chest may need to be removed so that small sticky patches called electrodes can be placed on your chest. These will transmit information that helps to monitor your heart during the procedure. An IV will be inserted into one of your veins. You might be given a medicine to control your heart rate during the procedure. This will help to ensure that good images are obtained. You will be asked to lie on an exam table. This table will slide in and out of the CT machine during the procedure. Contrast dye will be injected into the IV. You might feel warm, or you may get a metallic taste in your mouth. You will be given a medicine called nitroglycerin. This will relax or dilate the arteries in your heart. The table that you are lying on will move into the CT machine tunnel for the scan. The person running the machine will give you instructions while the scans are being done. You may be asked to: Keep your arms above your head. Hold your breath. Stay very still, even if the table is moving. When the scanning is complete, you will be moved out of the machine. The IV will be removed. The procedure may vary among health care providers and hospitals. What can I expect after the procedure? After your procedure, it is common to have: A metallic taste in your mouth from the contrast dye. A feeling of warmth. A headache from the nitroglycerin. Follow these instructions at home: Take over-the-counter and  prescription medicines only as told by your health care provider. If you are told, drink enough fluid to keep your urine pale yellow. This will help to flush the contrast dye out of your body. Most people can return to their normal activities right after the procedure. Ask your health care provider what activities are safe for you. It is up to you to get the results of your procedure. Ask your health care provider, or the department that is doing the procedure, when your results will be ready. Keep all follow-up visits as told by your health care provider. This is important. Contact a health care provider if: You have any symptoms of allergy to the contrast dye. These include: Shortness of breath. Rash or hives. A racing heartbeat. Summary A cardiac CT angiogram is a procedure to look at the heart and the area around the heart. It may be done to help find the cause of chest pains or other symptoms of heart disease. During this procedure, a large X-ray machine, called a CT scanner, takes detailed pictures of the heart and the surrounding area after a contrast dye has been injected into blood vessels in the area. Ask your health care provider about changing or stopping your regular medicines before the procedure. This is especially important if you are taking  diabetes medicines, blood thinners, or medicines to treat erectile dysfunction. If you are told, drink enough fluid to keep your urine pale yellow. This will help to flush the contrast dye out of your body. This information is not intended to replace advice given to you by your health care provider. Make sure you discuss any questions you have with your healthcare provider. Document Revised: 09/15/2018 Document Reviewed: 09/15/2018 Elsevier Patient Education  2022 [employer].    Signed, Debbe Odea, MD  09/03/2020 5:07 PM    [facility]

## 2020-09-03 NOTE — Telephone Encounter (Signed)
That's fine - [facility] please make her a lab appt for BMP

## 2020-09-03 NOTE — Telephone Encounter (Signed)
Please cancel and change order for lab work, patient has small veins and prefer to have them done at her PCP. Per patient she will go to her PCP at [facility] to get labs done.

## 2020-09-03 NOTE — Patient Instructions (Addendum)
Medication Instructions:  - Your physician has recommended you make the following change in your medication:   1) START Over The Counter (OTC) prilosec 20 mg- take 1 tablet by mouth once daily   *If you need a refill on your cardiac medications before your next appointment, please call your pharmacy*   Lab Work: - Your physician recommends that you have lab work today: BMP  If you have labs (blood work) drawn today and your tests are completely normal, you will receive your results only by: MyChart Message (if you have MyChart) OR A paper copy in the mail If you have any lab test that is abnormal or we need to change your treatment, we will call you to review the results.   Testing/Procedures: - Your physician has requested that you have cardiac CT. Cardiac computed tomography (CT) is a painless test that uses an x-ray machine to take clear, detailed pictures of your heart.      Your cardiac CT will be scheduled at one of the below locations:   [facility] [address] [place], [place] [address] [phone]  OR  [facility] [address] [place], [place] [address] [phone]  If scheduled at [facility], please arrive at the [facility] main entrance (entrance A) of [facility] 30 minutes prior to test start time. Proceed to the [facility] Radiology Department (first floor) to check-in and test prep.  If scheduled at [facility], please arrive 15 mins early for check-in and test prep.  Please follow these instructions carefully (unless otherwise directed):   On the Night Before the Test: Be sure to Drink plenty of water. Do not consume any caffeinated/decaffeinated beverages or chocolate 12 hours prior to your test. Do not take any antihistamines 12 hours prior to your test.   On the Day of the Test: Drink plenty of water until 1 hour prior to the test. Do  not eat any food 4 hours prior to the test. You may take your regular medications prior to the test.  Take metoprolol (Lopressor) 100 mg two hours prior to test. FEMALES- please wear underwire-free bra if available, avoid dresses & tight clothing   After the Test: Drink plenty of water. After receiving IV contrast, you may experience a mild flushed feeling. This is normal. On occasion, you may experience a mild rash up to 24 hours after the test. This is not dangerous. If this occurs, you can take Benadryl 25 mg and increase your fluid intake. If you experience trouble breathing, this can be serious. If it is severe call 911 IMMEDIATELY. If it is mild, please call our office.   Please allow 2-4 weeks for scheduling of routine cardiac CTs. Some insurance companies require a pre-authorization which may delay scheduling of this test.   For non-scheduling related questions, please contact the cardiac imaging nurse navigator should you have any questions/concerns: Rockwell Alexandria, Cardiac Imaging Nurse Navigator Larey Brick, Cardiac Imaging Nurse Navigator [facility] Direct Office Dial: [phone]   For scheduling needs, including cancellations and rescheduling, please call [place], [phone].    Follow-Up: At [facility], you and your health needs are our priority.  As part of our continuing mission to provide you with exceptional heart care, we have created designated Provider Care Teams.  These Care Teams include your primary Cardiologist (physician) and Advanced Practice Providers (APPs -  Physician Assistants and Nurse Practitioners) who all  work together to provide you with the care you need, when you need it.  We recommend signing up for the patient portal called "MyChart".  Sign up information is provided on this After Visit Summary.  MyChart is used to connect with patients for Virtual Visits (Telemedicine).  Patients are able to view lab/test  results, encounter notes, upcoming appointments, etc.  Non-urgent messages can be sent to your provider as well.   To learn more about what you can do with MyChart, go to [email or web address].    Your next appointment:   4-5 week(s)  The format for your next appointment:   In Person  Provider:   You may see Debbe Odea, MD or one of the following Advanced Practice Providers on your designated Care Team:   Nicolasa Ducking, NP Eula Listen, PA-C Marisue Ivan, PA-C Cadence Fransico Michael, [place]   Other Instructions   Cardiac CT Angiogram A cardiac CT angiogram is a procedure to look at the heart and the area around the heart. It may be done to help find the cause of chest pains or other symptoms of heart disease. During this procedure, a substance called contrast dye is injected into the blood vessels in the area to be checked. A large X-ray machine, called a CT scanner, then takes detailed pictures of the heart and the surrounding area. The procedure is also sometimes called a coronary CTangiogram, coronary artery scanning, or CTA. A cardiac CT angiogram allows the health care provider to see how well blood is flowing to and from the heart. The health care provider will be able to see if there are any problems, such as: Blockage or narrowing of the coronary arteries in the heart. Fluid around the heart. Signs of weakness or disease in the muscles, valves, and tissues of the heart. Tell a health care provider about: Any allergies you have. This is especially important if you have had a previous allergic reaction to contrast dye. All medicines you are taking, including vitamins, herbs, eye drops, creams, and over-the-counter medicines. Any blood disorders you have. Any surgeries you have had. Any medical conditions you have. Whether you are pregnant or may be pregnant. Any anxiety disorders, chronic pain, or other conditions you have that may increase your stress or prevent you from  lying still. What are the risks? Generally, this is a safe procedure. However, problems may occur, including: Bleeding. Infection. Allergic reactions to medicines or dyes. Damage to other structures or organs. Kidney damage from the contrast dye that is used. Increased risk of cancer from radiation exposure. This risk is low. Talk with your health care provider about: The risks and benefits of testing. How you can receive the lowest dose of radiation. What happens before the procedure? Wear comfortable clothing and remove any jewelry, glasses, dentures, and hearing aids. Follow instructions from your health care provider about eating and drinking. This may include: For 12 hours before the procedure -- avoid caffeine. This includes tea, coffee, soda, energy drinks, and diet pills. Drink plenty of water or other fluids that do not have caffeine in them. Being well hydrated can prevent complications. For 4-6 hours before the procedure -- stop eating and drinking. The contrast dye can cause nausea, but this is less likely if your stomach is empty. Ask your health care provider about changing or stopping your regular medicines. This is especially important if you are taking diabetes medicines, blood thinners, or medicines to treat problems with erections (erectile dysfunction). What happens during the procedure?  Hair on your chest may need to be removed so that small sticky patches called electrodes can be placed on your chest. These will transmit information that helps to monitor your heart during the procedure. An IV will be inserted into one of your veins. You might be given a medicine to control your heart rate during the procedure. This will help to ensure that good images are obtained. You will be asked to lie on an exam table. This table will slide in and out of the CT machine during the procedure. Contrast dye will be injected into the IV. You might feel warm, or you may get a metallic  taste in your mouth. You will be given a medicine called nitroglycerin. This will relax or dilate the arteries in your heart. The table that you are lying on will move into the CT machine tunnel for the scan. The person running the machine will give you instructions while the scans are being done. You may be asked to: Keep your arms above your head. Hold your breath. Stay very still, even if the table is moving. When the scanning is complete, you will be moved out of the machine. The IV will be removed. The procedure may vary among health care providers and hospitals. What can I expect after the procedure? After your procedure, it is common to have: A metallic taste in your mouth from the contrast dye. A feeling of warmth. A headache from the nitroglycerin. Follow these instructions at home: Take over-the-counter and prescription medicines only as told by your health care provider. If you are told, drink enough fluid to keep your urine pale yellow. This will help to flush the contrast dye out of your body. Most people can return to their normal activities right after the procedure. Ask your health care provider what activities are safe for you. It is up to you to get the results of your procedure. Ask your health care provider, or the department that is doing the procedure, when your results will be ready. Keep all follow-up visits as told by your health care provider. This is important. Contact a health care provider if: You have any symptoms of allergy to the contrast dye. These include: Shortness of breath. Rash or hives. A racing heartbeat. Summary A cardiac CT angiogram is a procedure to look at the heart and the area around the heart. It may be done to help find the cause of chest pains or other symptoms of heart disease. During this procedure, a large X-ray machine, called a CT scanner, takes detailed pictures of the heart and the surrounding area after a contrast dye has been  injected into blood vessels in the area. Ask your health care provider about changing or stopping your regular medicines before the procedure. This is especially important if you are taking diabetes medicines, blood thinners, or medicines to treat erectile dysfunction. If you are told, drink enough fluid to keep your urine pale yellow. This will help to flush the contrast dye out of your body. This information is not intended to replace advice given to you by your health care provider. Make sure you discuss any questions you have with your healthcare provider. Document Revised: 09/15/2018 Document Reviewed: 09/15/2018 Elsevier Patient Education  2022 [employer].

## 2020-09-03 NOTE — Telephone Encounter (Signed)
Noted- Dr. Azucena Cecil has ordered a Cardiac CTA for the patient. She will need an updated BMP on file prior to the CT.  Per our lab CMA today, the patient's veins were limited and she did not want to be stuck in the hand. She advised she would go to her PCP office to have this done and she left.  I advised our CMA that I cannot order labs to be done at [place] at [facility]. I will need to review with her PCP to see if she will order a BMP for the patient to have done for her Cardiac CT- pending insurance authorization.   I will then follow back up with the patient.

## 2020-09-04 ENCOUNTER — Other Ambulatory Visit: Payer: Self-pay

## 2020-09-04 ENCOUNTER — Other Ambulatory Visit (HOSPITAL_COMMUNITY): Payer: Self-pay

## 2020-09-04 ENCOUNTER — Other Ambulatory Visit: Payer: Self-pay | Admitting: *Deleted

## 2020-09-04 MED ORDER — OMEPRAZOLE 20 MG PO CPDR
20.0000 mg | DELAYED_RELEASE_CAPSULE | Freq: Every day | ORAL | 3 refills | Status: DC
  Filled 2020-09-04: qty 30, 30d supply, fill #0

## 2020-09-04 NOTE — Telephone Encounter (Signed)
Left VM requesting pt to call the office back and schedule lab appt for BMP

## 2020-09-04 NOTE — Telephone Encounter (Signed)
Pt called back and schedule a lab appt, please put order in

## 2020-09-04 NOTE — Telephone Encounter (Signed)
Rx correct. Ok to send capsules.  Ok to send Omeprazole 20 mg 1 capsule by mouth daily.  Thank you

## 2020-09-06 ENCOUNTER — Telehealth: Payer: Self-pay | Admitting: Cardiology

## 2020-09-06 NOTE — Telephone Encounter (Signed)
Patient calling  Needs to reschedule Cardiac CT on 08/18

## 2020-09-06 NOTE — Telephone Encounter (Signed)
Called patient back and she stated that she thinks she will be able to make the original appointment. I provided her with the number below in case she does need to reschedule.  Patient was grateful for the call back.  For scheduling needs, including cancellations and rescheduling, please call [place], [phone].

## 2020-09-07 ENCOUNTER — Other Ambulatory Visit (INDEPENDENT_AMBULATORY_CARE_PROVIDER_SITE_OTHER): Payer: No Typology Code available for payment source

## 2020-09-07 ENCOUNTER — Other Ambulatory Visit: Payer: Self-pay

## 2020-09-07 ENCOUNTER — Other Ambulatory Visit (HOSPITAL_COMMUNITY): Payer: Self-pay

## 2020-09-07 DIAGNOSIS — R0789 Other chest pain: Secondary | ICD-10-CM | POA: Diagnosis not present

## 2020-09-08 LAB — BASIC METABOLIC PANEL
BUN/Creatinine Ratio: 18 (ref 12–28)
BUN: 15 mg/dL (ref 8–27)
CO2: 25 mmol/L (ref 20–29)
Calcium: 9.8 mg/dL (ref 8.7–10.3)
Chloride: 101 mmol/L (ref 96–106)
Creatinine, Ser: 0.82 mg/dL (ref 0.57–1.00)
Glucose: 86 mg/dL (ref 65–99)
Potassium: 4.7 mmol/L (ref 3.5–5.2)
Sodium: 140 mmol/L (ref 134–144)
eGFR: 80 mL/min/{1.73_m2} (ref >59)

## 2020-09-11 ENCOUNTER — Other Ambulatory Visit (HOSPITAL_COMMUNITY): Payer: Self-pay

## 2020-09-17 ENCOUNTER — Ambulatory Visit: Admission: RE | Admit: 2020-09-17 | Payer: No Typology Code available for payment source | Source: Ambulatory Visit

## 2020-09-18 ENCOUNTER — Telehealth (HOSPITAL_COMMUNITY): Payer: Self-pay | Admitting: Emergency Medicine

## 2020-09-18 NOTE — Telephone Encounter (Signed)
Reaching out to patient to offer assistance regarding upcoming cardiac imaging study; pt verbalizes understanding of appt date/time, parking situation and where to check in, pre-test NPO status and medications ordered, and verified current allergies; name and call back number provided for further questions should they arise Rockwell Alexandria RN Navigator Cardiac Imaging [facility] [phone] office [phone] cell  Difficult IV 100mg  metoprolol  Some claustro

## 2020-09-20 ENCOUNTER — Ambulatory Visit
Admission: RE | Admit: 2020-09-20 | Discharge: 2020-09-20 | Disposition: A | Discharge status: Home / Self Care | Payer: No Typology Code available for payment source | Source: Ambulatory Visit | Attending: Cardiology | Admitting: Cardiology

## 2020-09-20 ENCOUNTER — Other Ambulatory Visit: Payer: Self-pay

## 2020-09-20 DIAGNOSIS — R072 Precordial pain: Secondary | ICD-10-CM

## 2020-09-20 MED ORDER — IOHEXOL 350 MG/ML SOLN
100.0000 mL | Freq: Once | INTRAVENOUS | Status: AC | PRN
  Administered 2020-09-20: 100 mL via INTRAVENOUS

## 2020-09-20 MED ORDER — NITROGLYCERIN 0.4 MG SL SUBL
0.8000 mg | SUBLINGUAL_TABLET | Freq: Once | SUBLINGUAL | Status: AC
  Administered 2020-09-20: 0.8 mg via SUBLINGUAL

## 2020-09-20 MED ORDER — METOPROLOL TARTRATE 5 MG/5ML IV SOLN
10.0000 mg | Freq: Once | INTRAVENOUS | Status: AC
  Administered 2020-09-20: 10 mg via INTRAVENOUS

## 2020-09-20 MED ORDER — DILTIAZEM HCL 25 MG/5ML IV SOLN
10.0000 mg | Freq: Once | INTRAVENOUS | Status: AC
  Administered 2020-09-20: 10 mg via INTRAVENOUS

## 2020-09-20 NOTE — Progress Notes (Signed)
Patient tolerated procedure well. Ambulate w/o difficulty. Denies light headedness or being dizzy. Sitting in chair drinking water provided. Encouraged to drink extra water today and reasoning explained. Verbalized understanding. All questions answered. ABC intact. No further needs. Discharge from procedure area w/o issues.   °

## 2020-09-24 ENCOUNTER — Other Ambulatory Visit: Payer: No Typology Code available for payment source

## 2020-09-25 ENCOUNTER — Ambulatory Visit: Payer: No Typology Code available for payment source | Admitting: Cardiovascular Disease

## 2020-10-18 ENCOUNTER — Other Ambulatory Visit (HOSPITAL_COMMUNITY): Payer: Self-pay

## 2020-10-22 ENCOUNTER — Ambulatory Visit (INDEPENDENT_AMBULATORY_CARE_PROVIDER_SITE_OTHER): Payer: No Typology Code available for payment source | Admitting: Cardiology

## 2020-10-22 ENCOUNTER — Other Ambulatory Visit: Payer: Self-pay

## 2020-10-22 ENCOUNTER — Encounter: Payer: Self-pay | Admitting: Cardiology

## 2020-10-22 VITALS — BP 130/60 | HR 66 | Ht 64.0 in | Wt 181.0 lb

## 2020-10-22 DIAGNOSIS — R072 Precordial pain: Secondary | ICD-10-CM | POA: Diagnosis not present

## 2020-10-22 DIAGNOSIS — E7800 Pure hypercholesterolemia, unspecified: Secondary | ICD-10-CM

## 2020-10-22 DIAGNOSIS — K219 Gastro-esophageal reflux disease without esophagitis: Secondary | ICD-10-CM

## 2020-10-22 NOTE — Progress Notes (Signed)
Cardiology Office Note:    Date:  10/22/2020   ID:  Carolyn Matthews, DOB [birth year], MRN [id number]  PCP:  Judy Pimple, MD   [facility] Providers Cardiologist:  None     Referring MD: Judy Pimple, MD   Chief Complaint  Patient presents with   Other    Follow up post CT -- Patient c/o chest pain. Meds reviewed verbally with patient.      History of Present Illness:    Carolyn Matthews is a 65 y.o. female with a hx of anxiety, hyperlipidemia, GERD, who presents for follow-up.  Previously seen due to chest pain.  Coronary CTA was obtained to evaluate presence of CAD.  She was started on Prilosec with improvement in chest pain symptoms.  Prior echocardiogram showed normal systolic function.  She thinks her symptoms are secondary to anxiety.  Prior notes Echo 08/2020 EF 70%.   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 0.5 gram vaginally at bedtime twice weekly   COVID-19 mRNA vaccine, Pfizer, 30 MCG/0.3ML injection INJECT AS DIRECTED   fluticasone (FLONASE) 50 MCG/ACT nasal spray USE 2 SPRAYS IN EACH NOSTRIL ONCE A DAY   gabapentin (NEURONTIN) 300 MG capsule  Take 1 capsule (300 mg total) by mouth at bedtime.   hydrOXYzine (ATARAX/VISTARIL) 25 MG tablet Take 1-2 tablets by mouth every 8 hours as needed for anxiety and panic   Multiple Vitamin (MULTIVITAMIN) tablet Take 1 tablet by mouth daily.   omeprazole (PRILOSEC) 20 MG capsule Take 1 capsule (20 mg total) by mouth daily.   sertraline (ZOLOFT) 50 MG tablet Take 1 tablet (50 mg total) by mouth daily.     Allergies:   Eggs or egg-derived products and Hepatitis a vaccine   Social History   Socioeconomic History   Marital status: Married    Spouse name: Not on file   Number of children: 2   Years of education: Not on file   Highest education level: Not on file  Occupational History   Occupation: Chartered loss adjuster: [employer]    Comment: [employer]    Employer: [employer]  Tobacco Use   Smoking status: Never   Smokeless tobacco: Never  Vaping Use   Vaping Use: Never used  Substance and Sexual Activity   Alcohol use: Yes    Alcohol/week: 0.0 standard drinks    Comment: occasional use   Drug use: No   Sexual activity: Yes    Birth control/protection: Surgical    Comment: Hysterectomy  Other  Topics Concern   Not on file  Social History Narrative   Not on file   Social Determinants of Health   Financial Resource Strain: Not on file  Food Insecurity: Not on file  Transportation Needs: Not on file  Physical Activity: Not on file  Stress: Not on file  Social Connections: Not on file     Family History: The patient's family history includes Breast cancer in her daughter, daughter and another family member; Diabetes in her father and paternal grandmother; Hypertension in her mother. There is no history of Colon cancer, Rectal cancer, Stomach cancer, or Esophageal cancer.  ROS:   Please see the history of present illness.     All other systems reviewed and are negative.  EKGs/Labs/Other Studies Reviewed:    The following studies were reviewed  today:   EKG:  EKG is  ordered today.  The ekg ordered today demonstrates normal sinus rhythm  Recent Labs: 01/20/2020: ALT 14 07/16/2020: Hemoglobin 12.1; Platelets 353.0; TSH 1.95 09/07/2020: BUN 15; Creatinine, Ser 0.82; Potassium 4.7; Sodium 140  Recent Lipid Panel    Component Value Date/Time   CHOL 190 04/18/2020 1118   TRIG 67.0 04/18/2020 1118   HDL 47.90 04/18/2020 1118   CHOLHDL 4 04/18/2020 1118   VLDL 13.4 04/18/2020 1118   LDLCALC 129 (H) 04/18/2020 1118     Risk Assessment/Calculations:          Physical Exam:    VS:  BP 130/60 (BP Location: Left Arm, Patient Position: Sitting, Cuff Size: Normal)   Pulse 66   Ht 5\' 4"  (1.626 m)   Wt 181 lb (82.1 kg)   SpO2 98%   BMI 31.07 kg/m     Wt Readings from Last 3 Encounters:  10/22/20 181 lb (82.1 kg)  09/03/20 179 lb (81.2 kg)  07/16/20 181 lb 6 oz (82.3 kg)     GEN:  Well nourished, well developed in no acute distress HEENT: Normal NECK: No JVD; No carotid bruits LYMPHATICS: No lymphadenopathy CARDIAC: RRR, no murmurs, rubs, gallops RESPIRATORY:  Clear to auscultation without rales, wheezing or rhonchi  ABDOMEN: Soft, non-tender, non-distended MUSCULOSKELETAL:  No edema; No deformity  SKIN: Warm and dry NEUROLOGIC:  Alert and oriented x 3 PSYCHIATRIC:  Normal affect   ASSESSMENT:    1. Precordial pain   2. Pure hypercholesterolemia   3. Gastroesophageal reflux disease, unspecified whether esophagitis present     PLAN:    In order of problems listed above:  Chest pain, Echo shows normal systolic function, coronary CTA with calcium score of 0, no evidence for CAD.  Patient made aware of results, reassured.  Continue Prilosec for GERD, plans to follow-up with PCP regarding management of anxiety. Hyperlipidemia, 10-year ASCVD risk 6.3%, not in statin benefit group, low-cholesterol diet advised. History of reflux, continue Prilosec OTC 20 mg daily.  Follow-up as needed      Medication  Adjustments/Labs and Tests Ordered: Current medicines are reviewed at length with the patient today.  Concerns regarding medicines are outlined above.  No orders of the defined types were placed in this encounter.   No orders of the defined types were placed in this encounter.    There are no Patient Instructions on file for this visit.   Signed, 07/18/20, MD  10/22/2020 5:25 PM    [facility]

## 2020-11-30 ENCOUNTER — Encounter: Payer: Self-pay | Admitting: Family Medicine

## 2020-12-04 ENCOUNTER — Other Ambulatory Visit (HOSPITAL_BASED_OUTPATIENT_CLINIC_OR_DEPARTMENT_OTHER): Payer: Self-pay

## 2020-12-12 ENCOUNTER — Other Ambulatory Visit (HOSPITAL_COMMUNITY): Payer: Self-pay

## 2020-12-22 ENCOUNTER — Other Ambulatory Visit (HOSPITAL_COMMUNITY): Payer: Self-pay

## 2021-01-01 ENCOUNTER — Other Ambulatory Visit (HOSPITAL_COMMUNITY): Payer: Self-pay

## 2021-01-05 ENCOUNTER — Other Ambulatory Visit (HOSPITAL_COMMUNITY): Payer: Self-pay

## 2021-01-07 ENCOUNTER — Other Ambulatory Visit (HOSPITAL_COMMUNITY): Payer: Self-pay

## 2021-01-17 ENCOUNTER — Other Ambulatory Visit (HOSPITAL_COMMUNITY): Payer: Self-pay

## 2021-01-21 ENCOUNTER — Encounter: Payer: Self-pay | Admitting: Family Medicine

## 2021-01-21 ENCOUNTER — Other Ambulatory Visit: Payer: Self-pay

## 2021-01-21 ENCOUNTER — Ambulatory Visit
Admission: RE | Admit: 2021-01-21 | Discharge: 2021-01-21 | Disposition: A | Discharge status: Home / Self Care | Payer: No Typology Code available for payment source | Source: Ambulatory Visit

## 2021-01-21 ENCOUNTER — Other Ambulatory Visit (HOSPITAL_COMMUNITY): Payer: Self-pay

## 2021-01-21 ENCOUNTER — Ambulatory Visit (INDEPENDENT_AMBULATORY_CARE_PROVIDER_SITE_OTHER): Payer: No Typology Code available for payment source | Admitting: Family Medicine

## 2021-01-21 VITALS — BP 124/76 | HR 82 | Temp 97.8°F | Ht 63.5 in | Wt 174.5 lb

## 2021-01-21 DIAGNOSIS — Z0000 Encounter for general adult medical examination without abnormal findings: Secondary | ICD-10-CM

## 2021-01-21 DIAGNOSIS — E559 Vitamin D deficiency, unspecified: Secondary | ICD-10-CM | POA: Diagnosis not present

## 2021-01-21 DIAGNOSIS — F411 Generalized anxiety disorder: Secondary | ICD-10-CM | POA: Diagnosis not present

## 2021-01-21 DIAGNOSIS — E7800 Pure hypercholesterolemia, unspecified: Secondary | ICD-10-CM | POA: Diagnosis not present

## 2021-01-21 DIAGNOSIS — R7303 Prediabetes: Secondary | ICD-10-CM

## 2021-01-21 DIAGNOSIS — K219 Gastro-esophageal reflux disease without esophagitis: Secondary | ICD-10-CM

## 2021-01-21 DIAGNOSIS — J019 Acute sinusitis, unspecified: Secondary | ICD-10-CM | POA: Insufficient documentation

## 2021-01-21 DIAGNOSIS — Z1231 Encounter for screening mammogram for malignant neoplasm of breast: Secondary | ICD-10-CM

## 2021-01-21 DIAGNOSIS — J0100 Acute maxillary sinusitis, unspecified: Secondary | ICD-10-CM

## 2021-01-21 MED ORDER — FLUTICASONE PROPIONATE 50 MCG/ACT NA SUSP
2.0000 | Freq: Every day | NASAL | 3 refills | Status: DC
  Filled 2021-01-21: qty 48, 90d supply, fill #0

## 2021-01-21 MED ORDER — SERTRALINE HCL 50 MG PO TABS
50.0000 mg | ORAL_TABLET | Freq: Every day | ORAL | 3 refills | Status: DC
  Filled 2021-01-21 – 2021-04-19 (×2): qty 90, 90d supply, fill #0
  Filled 2021-07-29: qty 90, 90d supply, fill #1
  Filled 2021-10-27: qty 90, 90d supply, fill #2

## 2021-01-21 MED ORDER — AMOXICILLIN-POT CLAVULANATE 875-125 MG PO TABS
1.0000 | ORAL_TABLET | Freq: Two times a day (BID) | ORAL | 0 refills | Status: DC
  Filled 2021-01-21: qty 14, 7d supply, fill #0

## 2021-01-21 NOTE — Assessment & Plan Note (Signed)
Reviewed health habits including diet and exercise and skin cancer prevention Reviewed appropriate screening tests for age  Also reviewed health mt list, fam hx and immunization status , as well as social and family history   See HPI Labs ordered  Encouraged more exercise  Discussed ca and D intake for bone health Declines flu shots  Mammogram was done today-pending report  Sees gyn for her vaginal estrogen cream  Colonoscopy is utd

## 2021-01-21 NOTE — Assessment & Plan Note (Signed)
Pt continues sertraline 50 mg daily  occ hydroxyzine prn  Reviewed stressors/ coping techniques/symptoms/ support sources/ tx options and side effects in detail today  Doing fairly well as long as she continues it

## 2021-01-21 NOTE — Patient Instructions (Addendum)
The bivalent covid booster is a good idea  Get it at the pharmacy   Gradually increase your exercise time  Goal 30 minutes or more 5 or more days per week   Take care of yourself   Drink lots of water   Take the augmentin with food for the sinus infection

## 2021-01-21 NOTE — Assessment & Plan Note (Signed)
Labs ordered Discussed importance for bone and overall health

## 2021-01-21 NOTE — Progress Notes (Signed)
Subjective:    Patient ID: Carolyn Matthews, female    DOB: 1955/07/28, 65 y.o.   MRN: [id number]  This visit occurred during the SARS-CoV-2 public health emergency.  Safety protocols were in place, including screening questions prior to the visit, additional usage of staff PPE, and extensive cleaning of exam room while observing appropriate contact time as indicated for disinfecting solutions.   HPI Here for health maintenance exam and to review chronic medical problems    Wt Readings from Last 3 Encounters:  01/21/21 174 lb 8 oz (79.2 kg)  10/22/20 181 lb (82.1 kg)  09/03/20 179 lb (81.2 kg)   30.43 kg/m  Doing ok overall  Last 2 weeks she thinks she has had a sinus infection  Lots of mucous - yellow in color  Coughing a lot  Pain in face and ears  Headaches   Has not had covid or the flu   Covid vaccinated -has been a year  Tdap 02/2017 Has shingrix vaccines  Declines flu shots    Mammogram 12/21 and has one scheduled for today -did it earlier Self breast exam -no lumps or changes  Has family h/o breast cancer   Post menopausal  Uses vaginal estrogen cream   Colonoscopy 06/2014   Fair self care/does not have much time  Lost a few lb  Trying not to drink soda and eat ice cream and other sweets  Also cut back salt   BP Readings from Last 3 Encounters:  01/21/21 124/76  10/22/20 130/60  09/20/20 106/67   Pulse Readings from Last 3 Encounters:  01/21/21 82  10/22/20 66  09/20/20 61     Exercise - has an exercise bike -10 minutes   GERD (thought cause of her chest discomfort)  Takes omeprazole -no longer needs very often  Doing better  Lab Results  Component Value Date   VITAMINB12 1,439 (H) 07/16/2020   Taking gabapentin for her leg cramps , now they have calmed down since she started magnesium (twice daily and no diarrhea)  Wants to get off of gabapentin   Takes regular vit D3  GAD is fairly stable  Zoloft 50 mg daily  (helps a lot-does not  want to stop it)  Hydroxyzine prn  Lot of stress caring for her mother  Also her daughters require a lot   Hyperlipidemia Lab Results  Component Value Date   CHOL 190 04/18/2020   HDL 47.90 04/18/2020   LDLCALC 129 (H) 04/18/2020   TRIG 67.0 04/18/2020   CHOLHDL 4 04/18/2020  Diet controlled  The 10-year ASCVD risk score (Arnett DK, et al., 2019) is: 6.7%   Values used to calculate the score:     Age: 52 years     Sex: Female     Is Non-Hispanic African American: Yes     Diabetic: No     Tobacco smoker: No     Systolic Blood Pressure: [id number] mmHg     Is BP treated: No     HDL Cholesterol: 47.9 mg/dL     Total Cholesterol: 190 mg/dL   Prediabetes Lab Results  Component Value Date   HGBA1C 6.0 01/20/2020  Due for labs today  Patient Active Problem List   Diagnosis Date Noted   Acute sinusitis 01/21/2021   Dizzy spells 08/08/2020   Allergic rhinitis 04/06/2020   OSA (obstructive sleep apnea) 01/20/2020   Leg cramps 03/19/2018   GERD (gastroesophageal reflux disease) 03/19/2018   Lumbar degenerative disc disease 12/08/2016  Lumbar back pain with radiculopathy affecting left lower extremity 12/02/2016   Patellar subluxation, left, initial encounter 06/23/2016   Vitamin D deficiency 10/03/2015   Encounter for vitamin deficiency screening 11/14/2014   Thoracic back pain 01/13/2014   Colon cancer screening 08/11/2012   Anxiety disorder 02/26/2011   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   Other screening mammogram 08/28/2010   Routine general medical examination at a health care facility 05/23/2010   MVP (mitral valve prolapse)    Chronic headaches    Seasonal allergies    Family history of breast cancer in female    Prediabetes 04/04/2009   Hyperlipidemia 11/22/2008   Anemia, unspecified 02/12/2007   OVERACTIVE BLADDER 01/11/2007   FIBROCYSTIC BREAST DISEASE 01/11/2007   Past Medical History:  Diagnosis Date   Allergy    seasonal   Anemia    Chronic  headaches    Family history of breast cancer in female    Daughter; gene neg per pt   Fibrocystic breast disease    GERD (gastroesophageal reflux disease)    Hyperglycemia    Diet controlled   MVP (mitral valve prolapse)    Seasonal allergies    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   Past Surgical History:  Procedure Laterality Date   ANKLE FRACTURE SURGERY  05/2003   BREAST BIOPSY  03/2004   Negative   BREAST BIOPSY Right 12/27/2014   fat necrosis / fibrocystic changes   BREAST CYST ASPIRATION  05/2007   Bilateral-benign   BREAST LUMPECTOMY  12/1977   TOTAL ABDOMINAL HYSTERECTOMY  09/1996   Fibroids   Social History   Tobacco Use   Smoking status: Never   Smokeless tobacco: Never  Vaping Use   Vaping Use: Never used  Substance Use Topics   Alcohol use: Yes    Alcohol/week: 0.0 standard drinks    Comment: occasional use   Drug use: No   Family History  Problem Relation Age of Onset   Diabetes Father    Hypertension Mother    Breast cancer Daughter        (inflammatory)-now has brain tumor   Diabetes Paternal 37    Breast cancer Other        mat great aunt   Breast cancer Daughter        2nd daughter with breast cancer    Colon cancer Neg Hx    Rectal cancer Neg Hx    Stomach cancer Neg Hx    Esophageal cancer Neg Hx    Allergies  Allergen Reactions   Eggs Or Egg-Derived Products Other (See Comments)    REACTION: SEVERE REACTION PV 4-22 pt states she gets flu like symptoms all but fever, no anaphylasis   Hepatitis A Vaccine Other (See Comments)    ? Allergic reaction; itching,weakness almost immediate after taking 1st vaccine   Current Outpatient Medications on File Prior to Visit  Medication Sig Dispense Refill   conjugated estrogens (PREMARIN) vaginal cream Place 1 Applicatorful vaginally 2 (two) times a week. 0.5 gram vaginally at bedtime twice weekly 30 g 3   gabapentin (NEURONTIN) 300 MG capsule Take 1 capsule (300 mg total) by mouth at  bedtime. 90 capsule 3   hydrOXYzine (ATARAX/VISTARIL) 25 MG tablet Take 1-2 tablets by mouth every 8 hours as needed for anxiety and panic 30 tablet 3   Multiple Vitamin (MULTIVITAMIN) tablet Take 1 tablet by mouth daily.     omeprazole (PRILOSEC) 20 MG capsule Take 1 capsule (20 mg total) by mouth daily.  30 capsule 3   No current facility-administered medications on file prior to visit.      Review of Systems  Constitutional:  Negative for activity change, appetite change, fatigue, fever and unexpected weight change.  HENT:  Negative for congestion, ear pain, rhinorrhea, sinus pressure and sore throat.   Eyes:  Negative for pain, redness and visual disturbance.  Respiratory:  Negative for cough, shortness of breath and wheezing.   Cardiovascular:  Negative for chest pain and palpitations.  Gastrointestinal:  Negative for abdominal pain, blood in stool, constipation and diarrhea.  Endocrine: Negative for polydipsia and polyuria.  Genitourinary:  Negative for dysuria, frequency and urgency.  Musculoskeletal:  Negative for arthralgias, back pain and myalgias.  Skin:  Negative for pallor and rash.  Allergic/Immunologic: Negative for environmental allergies.  Neurological:  Negative for dizziness, syncope and headaches.  Hematological:  Negative for adenopathy. Does not bruise/bleed easily.  Psychiatric/Behavioral:  Negative for decreased concentration and dysphoric mood. The patient is not nervous/anxious.        Stressors      Objective:   Physical Exam Constitutional:      General: She is not in acute distress.    Appearance: Normal appearance. She is well-developed. She is obese. She is not ill-appearing or diaphoretic.  HENT:     Head: Normocephalic and atraumatic.     Comments: Tender over sides of forehead    Right Ear: Tympanic membrane, ear canal and external ear normal.     Left Ear: Tympanic membrane, ear canal and external ear normal.     Nose: Congestion present.      Mouth/Throat:     Mouth: Mucous membranes are moist.     Pharynx: Oropharynx is clear. No posterior oropharyngeal erythema.  Eyes:     General: No scleral icterus.    Extraocular Movements: Extraocular movements intact.     Conjunctiva/sclera: Conjunctivae normal.     Pupils: Pupils are equal, round, and reactive to light.  Neck:     Thyroid: No thyromegaly.     Vascular: No carotid bruit or JVD.  Cardiovascular:     Rate and Rhythm: Normal rate and regular rhythm.     Pulses: Normal pulses.     Heart sounds: Normal heart sounds.    No gallop.  Pulmonary:     Effort: Pulmonary effort is normal. No respiratory distress.     Breath sounds: Normal breath sounds. No wheezing.     Comments: Good air exch Chest:     Chest wall: No tenderness.  Abdominal:     General: Bowel sounds are normal. There is no distension or abdominal bruit.     Palpations: Abdomen is soft. There is no mass.     Tenderness: There is no abdominal tenderness.     Hernia: No hernia is present.  Genitourinary:    Comments: Breast exam: No mass, nodules, thickening, tenderness, bulging, retraction, inflamation, nipple discharge or skin changes noted.  No axillary or clavicular LA.     Musculoskeletal:        General: No tenderness. Normal range of motion.     Cervical back: Normal range of motion and neck supple. No rigidity. No muscular tenderness.     Right lower leg: No edema.     Left lower leg: No edema.     Comments: No kyphosis   Lymphadenopathy:     Cervical: No cervical adenopathy.  Skin:    General: Skin is warm and dry.     Coloration:  Skin is not pale.     Findings: No erythema or rash.     Comments: Few skin tags  Neurological:     Mental Status: She is alert. Mental status is at baseline.     Cranial Nerves: No cranial nerve deficit.     Motor: No abnormal muscle tone.     Coordination: Coordination normal.     Gait: Gait normal.     Deep Tendon Reflexes: Reflexes are normal and symmetric.  Reflexes normal.  Psychiatric:        Mood and Affect: Mood normal.        Cognition and Memory: Cognition and memory normal.          Assessment & Plan:   Problem List Items Addressed This Visit       Respiratory   Acute sinusitis    Pt has had congestion with purulent nasal discharge and facial pain/pressure (also ear pain) 2 weeks of symptoms  Px augmentin to take as directed Treat symptoms  Update if not starting to improve in a week or if worsening        Relevant Medications   amoxicillin-clavulanate (AUGMENTIN) 875-125 MG tablet   fluticasone (FLONASE) 50 MCG/ACT nasal spray     Digestive   GERD (gastroesophageal reflux disease)    No longer needs omeprazole regularly  Thought to be reason for her chest pain in the past        Other   Hyperlipidemia    Disc goals for lipids and reasons to control them Rev last labs with pt Rev low sat fat diet in detail Lipid panel ordered  Diet is fairly good  Encouraged more exercise        Relevant Orders   Lipid panel   Prediabetes    a1c ordered  Better diet recently - cut ice cream and soda and sweets       Routine general medical examination at a health care facility - Primary    Reviewed health habits including diet and exercise and skin cancer prevention Reviewed appropriate screening tests for age  Also reviewed health mt list, fam hx and immunization status , as well as social and family history   See HPI Labs ordered  Encouraged more exercise  Discussed ca and D intake for bone health Declines flu shots  Mammogram was done today-pending report  Sees gyn for her vaginal estrogen cream  Colonoscopy is utd       Relevant Orders   CBC with Differential/Platelet   Comprehensive metabolic panel   Lipid panel   TSH   Anxiety disorder    Pt continues sertraline 50 mg daily  occ hydroxyzine prn  Reviewed stressors/ coping techniques/symptoms/ support sources/ tx options and side effects in detail  today  Doing fairly well as long as she continues it      Relevant Medications   sertraline (ZOLOFT) 50 MG tablet   Vitamin D deficiency    Labs ordered Discussed importance for bone and overall health       Relevant Orders   VITAMIN D 25 Hydroxy (Vit-D Deficiency, Fractures)

## 2021-01-21 NOTE — Assessment & Plan Note (Signed)
a1c ordered  Better diet recently - cut ice cream and soda and sweets

## 2021-01-21 NOTE — Assessment & Plan Note (Signed)
Disc goals for lipids and reasons to control them Rev last labs with pt Rev low sat fat diet in detail Lipid panel ordered  Diet is fairly good  Encouraged more exercise

## 2021-01-21 NOTE — Assessment & Plan Note (Signed)
No longer needs omeprazole regularly  Thought to be reason for her chest pain in the past

## 2021-01-21 NOTE — Assessment & Plan Note (Signed)
Pt has had congestion with purulent nasal discharge and facial pain/pressure (also ear pain) 2 weeks of symptoms  Px augmentin to take as directed Treat symptoms  Update if not starting to improve in a week or if worsening

## 2021-01-22 LAB — CBC WITH DIFFERENTIAL/PLATELET
Basophils Absolute: 0.1 10*3/uL (ref 0.0–0.1)
Basophils Relative: 1 % (ref 0.0–3.0)
Eosinophils Absolute: 0.2 10*3/uL (ref 0.0–0.7)
Eosinophils Relative: 2.2 % (ref 0.0–5.0)
HCT: 34.9 % — ABNORMAL LOW (ref 36.0–46.0)
Hemoglobin: 11.5 g/dL — ABNORMAL LOW (ref 12.0–15.0)
Lymphocytes Relative: 25.6 % (ref 12.0–46.0)
Lymphs Abs: 2.2 10*3/uL (ref 0.7–4.0)
MCHC: 33.1 g/dL (ref 30.0–36.0)
MCV: 91.3 fl (ref 78.0–100.0)
Monocytes Absolute: 0.5 10*3/uL (ref 0.1–1.0)
Monocytes Relative: 5.9 % (ref 3.0–12.0)
Neutro Abs: 5.5 10*3/uL (ref 1.4–7.7)
Neutrophils Relative %: 65.3 % (ref 43.0–77.0)
Platelets: 363 10*3/uL (ref 150.0–400.0)
RBC: 3.82 Mil/uL — ABNORMAL LOW (ref 3.87–5.11)
RDW: 14.4 % (ref 11.5–15.5)
WBC: 8.5 10*3/uL (ref 4.0–10.5)

## 2021-01-22 LAB — COMPREHENSIVE METABOLIC PANEL
ALT: 11 U/L (ref 0–35)
AST: 16 U/L (ref 0–37)
Albumin: 4.5 g/dL (ref 3.5–5.2)
Alkaline Phosphatase: 81 U/L (ref 39–117)
BUN: 19 mg/dL (ref 6–23)
CO2: 31 mEq/L (ref 19–32)
Calcium: 9.9 mg/dL (ref 8.4–10.5)
Chloride: 104 mEq/L (ref 96–112)
Creatinine, Ser: 0.97 mg/dL (ref 0.40–1.20)
GFR: 61.96 mL/min (ref >60.00)
Glucose, Bld: 99 mg/dL (ref 70–99)
Potassium: 4.8 mEq/L (ref 3.5–5.1)
Sodium: 142 mEq/L (ref 135–145)
Total Bilirubin: 0.4 mg/dL (ref 0.2–1.2)
Total Protein: 7.5 g/dL (ref 6.0–8.3)

## 2021-01-22 LAB — LIPID PANEL
Cholesterol: 215 mg/dL — ABNORMAL HIGH (ref 0–200)
HDL: 49.6 mg/dL (ref >39.00)
LDL Cholesterol: 141 mg/dL — ABNORMAL HIGH (ref 0–99)
NonHDL: 165.58
Total CHOL/HDL Ratio: 4
Triglycerides: 121 mg/dL (ref 0.0–149.0)
VLDL: 24.2 mg/dL (ref 0.0–40.0)

## 2021-01-22 LAB — VITAMIN D 25 HYDROXY (VIT D DEFICIENCY, FRACTURES): VITD: 52.24 ng/mL (ref 30.00–100.00)

## 2021-01-22 LAB — TSH: TSH: 1.49 u[IU]/mL (ref 0.35–5.50)

## 2021-01-23 ENCOUNTER — Encounter: Payer: Self-pay | Admitting: Family Medicine

## 2021-01-23 ENCOUNTER — Other Ambulatory Visit: Payer: No Typology Code available for payment source

## 2021-01-23 DIAGNOSIS — R7303 Prediabetes: Secondary | ICD-10-CM

## 2021-01-24 LAB — HEMOGLOBIN A1C
Hgb A1c MFr Bld: 6.1 % of total Hgb — ABNORMAL HIGH (ref <5.7)
Mean Plasma Glucose: 128 mg/dL
eAG (mmol/L): 7.1 mmol/L

## 2021-02-08 ENCOUNTER — Other Ambulatory Visit (HOSPITAL_COMMUNITY): Payer: Self-pay

## 2021-02-18 ENCOUNTER — Encounter: Payer: Self-pay | Admitting: Family Medicine

## 2021-02-18 ENCOUNTER — Telehealth: Payer: No Typology Code available for payment source | Admitting: Nurse Practitioner

## 2021-02-18 DIAGNOSIS — R197 Diarrhea, unspecified: Secondary | ICD-10-CM

## 2021-02-18 NOTE — Progress Notes (Signed)
Based on what you shared with me, I feel your condition warrants further evaluation and I recommend that you be seen in a face to face visit.  Based on the length of the time you have had symptoms you will likely need lab work and possibly stool samples taken. It is also important that someone examines your abdomen in person.    NOTE: There will be NO CHARGE for this eVisit   If you are having a true medical emergency please call 911.      For an urgent face to face visit, [facility] has six urgent care centers for your convenience:     [facility] [place] Get Driving Directions [id number] [place], [place] [address]    [facility]) Get Driving Directions [id number] [place], [place] [address]  [facility]) Get Driving Directions [id number] [address] [place],  [place]  [address]  [facility] Get Driving Directions [id number] [place], [place], [place] [address]   [facility] Get Driving Directions  [id number] [address] [place], [place] [address]   [facility] Get Driving Directions [id number] [address], [place], [place] [address]  Your MyChart E-visit questionnaire answers were reviewed by a board certified advanced clinical practitioner to complete your personal care plan based on your specific symptoms.  Thank you for using e-Visits.

## 2021-04-20 ENCOUNTER — Other Ambulatory Visit (HOSPITAL_COMMUNITY): Payer: Self-pay

## 2021-06-04 ENCOUNTER — Encounter: Payer: Self-pay | Admitting: Family Medicine

## 2021-07-30 ENCOUNTER — Other Ambulatory Visit (HOSPITAL_COMMUNITY): Payer: Self-pay

## 2021-08-01 ENCOUNTER — Ambulatory Visit: Payer: No Typology Code available for payment source

## 2021-08-01 NOTE — Progress Notes (Signed)
   Covid-19 Vaccination Clinic  Name:  Carolyn Matthews    MRN: [id number] DOB: 1955-06-02  08/01/2021  [name] was observed post Covid-19 immunization for 15 minutes without incident. She was provided with Vaccine Information Sheet and instruction to access the V-Safe system.   [name] was instructed to call 911 with any severe reactions post vaccine: Difficulty breathing  Swelling of face and throat  A fast heartbeat  A bad rash all over body  Dizziness and weakness

## 2021-09-04 ENCOUNTER — Other Ambulatory Visit: Payer: Self-pay | Admitting: Family Medicine

## 2021-09-06 ENCOUNTER — Other Ambulatory Visit (HOSPITAL_COMMUNITY): Payer: Self-pay

## 2021-09-09 ENCOUNTER — Other Ambulatory Visit (HOSPITAL_COMMUNITY): Payer: Self-pay

## 2021-09-10 ENCOUNTER — Other Ambulatory Visit (HOSPITAL_COMMUNITY): Payer: Self-pay

## 2021-09-10 ENCOUNTER — Other Ambulatory Visit: Payer: Self-pay

## 2021-09-10 MED ORDER — GABAPENTIN 300 MG PO CAPS
300.0000 mg | ORAL_CAPSULE | Freq: Every day | ORAL | 0 refills | Status: DC
  Filled 2021-09-10: qty 30, 30d supply, fill #0

## 2021-10-28 ENCOUNTER — Other Ambulatory Visit (HOSPITAL_COMMUNITY): Payer: Self-pay

## 2021-11-11 ENCOUNTER — Encounter: Payer: Self-pay | Admitting: Family Medicine

## 2021-11-11 DIAGNOSIS — M5136 Other intervertebral disc degeneration, lumbar region: Secondary | ICD-10-CM

## 2021-11-11 DIAGNOSIS — M5416 Radiculopathy, lumbar region: Secondary | ICD-10-CM

## 2021-11-11 DIAGNOSIS — S83002A Unspecified subluxation of left patella, initial encounter: Secondary | ICD-10-CM

## 2021-12-18 NOTE — Progress Notes (Signed)
[facility] [address] [place] [address] Phone: [phone] Subjective:   Carolyn Matthews, am serving as a scribe for Dr. Antoine Primas.  I'm seeing this patient by the request  of:  Tower, Audrie Gallus, MD  CC: low back pain and knee pain   [id number]  07/16/2021 Patient does have MRI from 2018 showing the patient did have a nerve root impingement noted that could be consistent with some of patient's leg pain.  Patient's pain was only at night.  We will get laboratory work-up to rule out iron deficiency anemia that could be contributing.  Patient's MCV is normal but has had a hemoglobin in the 11's for quite some time.  We will get an ABI to further evaluate the blood flow in the legs but I do anticipate being fairly normal.  Patient will follow up with me again in 6 weeks otherwise.     Update 12/19/2021 Carolyn Matthews is a 66 y.o. female coming in with complaint of LBP and L knee pain. Patient states LBP comes and goes, but the left knee was having problems a couple weeks ago where she couldn't put weight on it. States today she is fine. Right leg she is having some new issues where the pain radiates from her right hip all the way down to her right ankle. Is noting some right leg weakness, and some numbness.       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   Past Surgical History:  Procedure Laterality Date   ANKLE FRACTURE SURGERY  05/2003   BREAST BIOPSY  03/2004   Negative   BREAST BIOPSY Right 12/27/2014   fat necrosis / fibrocystic changes   BREAST CYST ASPIRATION  05/2007   Bilateral-benign   BREAST  LUMPECTOMY  12/1977   TOTAL ABDOMINAL HYSTERECTOMY  09/1996   Fibroids   Social History   Socioeconomic History   Marital status: Married    Spouse name: Not on file   Number of children: 2   Years of education: Not on file   Highest education level: Not on file  Occupational History   Occupation: Chartered loss adjuster: [employer]    Comment: [employer]    Employer: [employer]  Tobacco Use   Smoking status: Never   Smokeless tobacco: Never  Vaping Use   Vaping Use: Never used  Substance and Sexual Activity   Alcohol use: Yes    Alcohol/week: 0.0 standard drinks of alcohol    Comment: occasional use   Drug use: No   Sexual activity: Yes    Birth control/protection: Surgical    Comment: Hysterectomy  Other Topics Concern   Not on file  Social History Narrative   Not on file   Social Determinants of Health   Financial Resource Strain: Not on file  Food Insecurity: Not on file  Transportation Needs: Not on file  Physical Activity: Not on file  Stress: Not on file  Social Connections: Not on file   Allergies  Allergen Reactions  Eggs Or Egg-Derived Products Other (See Comments)    REACTION: SEVERE REACTION PV 4-22 pt states she gets flu like symptoms all but fever, no anaphylasis   Hepatitis A Vaccine Other (See Comments)    ? Allergic reaction; itching,weakness almost immediate after taking 1st vaccine   Family History  Problem Relation Age of Onset   Diabetes Father    Hypertension Mother    Breast cancer Daughter        (inflammatory)-now has brain tumor   Diabetes Paternal Grandmother    Breast cancer Other        mat great aunt   Breast cancer Daughter        2nd daughter with breast cancer    Colon cancer Neg Hx    Rectal cancer Neg Hx    Stomach cancer Neg Hx    Esophageal cancer Neg Hx       Current Outpatient Medications (Respiratory):    fluticasone (FLONASE) 50 MCG/ACT nasal spray, USE 2 SPRAYS IN EACH NOSTRIL ONCE A  DAY  Current Outpatient Medications (Analgesics):    meloxicam (MOBIC) 7.5 MG tablet, Take 1 tablet (7.5 mg total) by mouth daily.   Current Outpatient Medications (Other):    conjugated estrogens (PREMARIN) vaginal cream, Place 1 Applicatorful vaginally 2 (two) times a week. 0.5 gram vaginally at bedtime twice weekly   hydrOXYzine (ATARAX/VISTARIL) 25 MG tablet, Take 1-2 tablets by mouth every 8 hours as needed for anxiety and panic   Multiple Vitamin (MULTIVITAMIN) tablet, Take 1 tablet by mouth daily.   omeprazole (PRILOSEC) 20 MG capsule, Take 1 capsule (20 mg total) by mouth daily.   sertraline (ZOLOFT) 50 MG tablet, Take 1 tablet (50 mg total) by mouth daily.   gabapentin (NEURONTIN) 300 MG capsule, Take 1 capsule (300 mg total) by mouth at bedtime. (Patient not taking: Reported on 12/19/2021)   Reviewed prior external information including notes and imaging from  primary care provider As well as notes that were available from care everywhere and other healthcare systems.  Past medical history, social, surgical and family history all reviewed in electronic medical record.  No pertanent information unless stated regarding to the chief complaint.   Review of Systems:  No headache, visual changes, nausea, vomiting, diarrhea, constipation, dizziness, abdominal pain, skin rash, fevers, chills, night sweats, weight loss, swollen lymph nodes, body aches, joint swelling, chest pain, shortness of breath, mood changes. POSITIVE muscle aches  Objective  Blood pressure 104/62, pulse 64, height 5' 3.5" (1.613 m), weight 165 lb (74.8 kg), SpO2 94 %.   General: No apparent distress alert and oriented x3 mood and affect normal, dressed appropriately.  HEENT: Pupils equal, extraocular movements intact  Respiratory: Patient's speak in full sentences and does not appear short of breath  Cardiovascular: No lower extremity edema, non tender, no erythema  Left knee exam shows the patient does have  some tenderness to palpation but very mild overall.  Patient does have very mild crepitus.  No significant instability noted. Low back exam does have some loss of lordosis.  Good range of motion though noted lacking only the last 5 degrees of extension.  Negative straight leg test.  Mild tightness with FABER test on the right compared to the left   [id number]; 15 additional minutes spent for Therapeutic exercises as stated in above notes.  This included exercises focusing on stretching, strengthening, with significant focus on eccentric aspects.   Long term goals include an improvement in range of motion, strength, endurance as  well as avoiding reinjury. Patient's frequency would include in 1-2 times a day, 3-5 times a week for a duration of 6-12 weeks. Low back exercises that included:  Pelvic tilt/bracing instruction to focus on control of the pelvic girdle and lower abdominal muscles  Glute strengthening exercises, focusing on proper firing of the glutes without engaging the low back muscles Proper stretching techniques for maximum relief for the hamstrings, hip flexors, low back and some rotation where tolerated  Proper technique shown and discussed handout in great detail with ATC.  All questions were discussed and answered.     Impression and Recommendations:     The above documentation has been reviewed and is accurate and complete Judi Saa, DO

## 2021-12-19 ENCOUNTER — Other Ambulatory Visit (HOSPITAL_COMMUNITY): Payer: Self-pay

## 2021-12-19 ENCOUNTER — Ambulatory Visit (INDEPENDENT_AMBULATORY_CARE_PROVIDER_SITE_OTHER): Payer: No Typology Code available for payment source | Admitting: Family Medicine

## 2021-12-19 VITALS — BP 104/62 | HR 64 | Ht 63.5 in | Wt 165.0 lb

## 2021-12-19 DIAGNOSIS — M5416 Radiculopathy, lumbar region: Secondary | ICD-10-CM

## 2021-12-19 MED ORDER — MELOXICAM 7.5 MG PO TABS
7.5000 mg | ORAL_TABLET | Freq: Every day | ORAL | 0 refills | Status: DC
  Filled 2021-12-19: qty 30, 30d supply, fill #0

## 2021-12-19 NOTE — Patient Instructions (Signed)
Meloxicam 7.5mg   See me in 6-8 weeks

## 2021-12-20 ENCOUNTER — Encounter: Payer: Self-pay | Admitting: Family Medicine

## 2021-12-20 NOTE — Assessment & Plan Note (Addendum)
Chronic problem with an exacerbation.  The patient was having some radicular symptoms on the left side of the back.  Has been quite sometime since patient has complained of this.  We discussed anti-inflammatories, prescription and gabapentin and warned of potential side effects, we discussed home exercises and patient work with Event organiser.  Follow-up again in 6 to 8 weeks worsening pain consider formal physical therapy or advanced imaging.  Meloxicam 7.5 mg from day burst scheduled as well.

## 2021-12-23 ENCOUNTER — Other Ambulatory Visit (HOSPITAL_COMMUNITY): Payer: Self-pay

## 2021-12-30 ENCOUNTER — Other Ambulatory Visit: Payer: Self-pay | Admitting: Family Medicine

## 2021-12-30 DIAGNOSIS — Z1231 Encounter for screening mammogram for malignant neoplasm of breast: Secondary | ICD-10-CM

## 2022-01-13 DIAGNOSIS — Z1231 Encounter for screening mammogram for malignant neoplasm of breast: Secondary | ICD-10-CM

## 2022-01-22 ENCOUNTER — Encounter: Payer: No Typology Code available for payment source | Admitting: Family Medicine

## 2022-01-26 ENCOUNTER — Other Ambulatory Visit: Payer: Self-pay | Admitting: Family Medicine

## 2022-01-29 MED ORDER — SERTRALINE HCL 50 MG PO TABS
50.0000 mg | ORAL_TABLET | Freq: Every day | ORAL | 3 refills | Status: DC
  Filled 2022-01-29: qty 90, 90d supply, fill #0

## 2022-01-29 NOTE — Telephone Encounter (Signed)
Refill request Zoloft Last refill 01/21/21 #90/3 Upcoming appointment 02/26/22 See drug warning with Meloxicam

## 2022-01-30 ENCOUNTER — Other Ambulatory Visit: Payer: Self-pay

## 2022-01-30 ENCOUNTER — Other Ambulatory Visit (HOSPITAL_COMMUNITY): Payer: Self-pay

## 2022-02-04 NOTE — Progress Notes (Signed)
[facility] [address] [place] Phone: [phone] Subjective:    I'm seeing this patient by the request  of:  Tower, Wynelle Fanny, MD  CC: Low back pain  [id number]  Taheerah Guldin is a 67 y.o. female coming in with complaint of lumbar spine pain. Last seen on 12/19/2021.  Was having back pain and started on a low-dose of meloxicam patient states that the Meloxicam did help but she is still having pain that comes and goes. Patient has a new issue that starts on her neck on the left side that radiates down her shoulder. Patient has had a massage and topical cream to help, but the pain comes back.       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   Past Surgical History:  Procedure Laterality Date   ANKLE FRACTURE SURGERY  05/2003   BREAST BIOPSY  03/2004   Negative   BREAST BIOPSY Right 12/27/2014   fat necrosis / fibrocystic changes   BREAST CYST ASPIRATION  05/2007   Bilateral-benign   BREAST LUMPECTOMY  12/1977   TOTAL ABDOMINAL HYSTERECTOMY  09/1996   Fibroids   Social History   Socioeconomic History   Marital status: Married    Spouse name: Not on file   Number of children: 2   Years of education: Not on file   Highest education level: Not on file  Occupational History   Occupation: Patent attorney: [place]    Employer: [employer]  Tobacco Use   Smoking status: Never   Smokeless tobacco: Never  Vaping Use   Vaping Use: Never used  Substance and Sexual Activity   Alcohol use: Yes    Alcohol/week: 0.0 standard drinks of alcohol     Comment: occasional use   Drug use: No   Sexual activity: Yes    Birth control/protection: Surgical    Comment: Hysterectomy  Other Topics Concern   Not on file  Social History Narrative   Not on file   Social Determinants of Health   Financial Resource Strain: Not on file  Food Insecurity: Not on file  Transportation Needs: Not on file  Physical Activity: Not on file  Stress: Not on file  Social Connections: Not on file   Allergies  Allergen Reactions   Eggs Or Egg-Derived Products Other (See Comments)    REACTION: SEVERE REACTION PV 4-22 pt states she gets flu like symptoms all but fever, no anaphylasis   Hepatitis A Vaccine Other (See Comments)    ? Allergic reaction; itching,weakness almost immediate after taking 1st vaccine   Family History  Problem Relation Age of Onset   Diabetes Father    Hypertension Mother    Breast cancer Daughter        (inflammatory)-now has brain tumor   Diabetes Paternal Grandmother    Breast cancer Other        mat great aunt   Breast cancer Daughter        2nd daughter  with breast cancer    Colon cancer Neg Hx    Rectal cancer Neg Hx    Stomach cancer Neg Hx    Esophageal cancer Neg Hx       Current Outpatient Medications (Respiratory):    fluticasone (FLONASE) 50 MCG/ACT nasal spray, USE 2 SPRAYS IN EACH NOSTRIL ONCE A DAY  Current Outpatient Medications (Analgesics):    meloxicam (MOBIC) 7.5 MG tablet, Take 1 tablet (7.5 mg total) by mouth daily.   Current Outpatient Medications (Other):    conjugated estrogens (PREMARIN) vaginal cream, Place 1 Applicatorful vaginally 2 (two) times a week. 0.5 gram vaginally at bedtime twice weekly   hydrOXYzine (ATARAX/VISTARIL) 25 MG tablet, Take 1-2 tablets by mouth every 8 hours as needed for anxiety and panic   Multiple Vitamin (MULTIVITAMIN) tablet, Take 1 tablet by mouth daily.   omeprazole (PRILOSEC) 20 MG capsule, Take 1 capsule (20 mg total) by mouth daily.   sertraline (ZOLOFT)  50 MG tablet, Take 1 tablet (50 mg total) by mouth daily.   gabapentin (NEURONTIN) 300 MG capsule, Take 1 capsule (300 mg total) by mouth at bedtime. (Patient not taking: Reported on 12/19/2021)   Reviewed prior external information including notes and imaging from  primary care provider As well as notes that were available from care everywhere and other healthcare systems.  Past medical history, social, surgical and family history all reviewed in electronic medical record.  No pertanent information unless stated regarding to the chief complaint.   Review of Systems:  No headache, visual changes, nausea, vomiting, diarrhea, constipation, dizziness, abdominal pain, skin rash, fevers, chills, night sweats, weight loss, swollen lymph nodes, body aches, joint swelling, chest pain, shortness of breath, mood changes. POSITIVE muscle aches  Objective  Blood pressure 138/60, pulse 64, height 5' 3.5" (1.613 m), weight 165 lb (74.8 kg), SpO2 96 %.   General: No apparent distress alert and oriented x3 mood and affect normal, dressed appropriately.  HEENT: Pupils equal, extraocular movements intact  Respiratory: Patient's speak in full sentences and does not appear short of breath  Cardiovascular: No lower extremity edema, non tender, no erythema  Neck exam does have some loss of lordosis.  Some limited sidebending noted.  Questionable positive Spurling's noted but does have significant voluntary guarding.  5 out of 5 strength of the upper extremities.  [id number]; 15 additional minutes spent for Therapeutic exercises as stated in above notes.  This included exercises focusing on stretching, strengthening, with significant focus on eccentric aspects.   Long term goals include an improvement in range of motion, strength, endurance as well as avoiding reinjury. Patient's frequency would include in 1-2 times a day, 3-5 times a week for a duration of 6-12 weeks. Exercises that included:  Basic scapular  stabilization to include adduction and depression of scapula Scaption, focusing on proper movement and good control Internal and External rotation utilizing a theraband, with elbow tucked at side entire time Rows with theraband   Proper technique shown and discussed handout in great detail with ATC.  All questions were discussed and answered.      Impression and Recommendations:     The above documentation has been reviewed and is accurate and complete Lyndal Pulley, DO

## 2022-02-06 ENCOUNTER — Ambulatory Visit (INDEPENDENT_AMBULATORY_CARE_PROVIDER_SITE_OTHER): Payer: 59 | Admitting: Family Medicine

## 2022-02-06 ENCOUNTER — Ambulatory Visit (INDEPENDENT_AMBULATORY_CARE_PROVIDER_SITE_OTHER): Payer: 59

## 2022-02-06 VITALS — BP 138/60 | HR 64 | Ht 63.5 in | Wt 165.0 lb

## 2022-02-06 DIAGNOSIS — M542 Cervicalgia: Secondary | ICD-10-CM | POA: Diagnosis not present

## 2022-02-06 DIAGNOSIS — M5030 Other cervical disc degeneration, unspecified cervical region: Secondary | ICD-10-CM | POA: Diagnosis not present

## 2022-02-06 NOTE — Patient Instructions (Addendum)
Good to see you  Get neck x ray on the way out  Scapular exercises Follow up in 6-8 weeks

## 2022-02-07 ENCOUNTER — Encounter: Payer: Self-pay | Admitting: Family Medicine

## 2022-02-07 DIAGNOSIS — M5030 Other cervical disc degeneration, unspecified cervical region: Secondary | ICD-10-CM | POA: Insufficient documentation

## 2022-02-07 NOTE — Assessment & Plan Note (Signed)
Degenerative cervical spinal stenosis is likely.  Discussed different medications but patient wanted to try home exercises.  Given home exercises and work with Product/process development scientist.  Discussed formal physical therapy and patient declined at the moment.  Increase activity slowly.  Follow-up again in 6 to 8 weeks.

## 2022-02-21 DIAGNOSIS — Z1231 Encounter for screening mammogram for malignant neoplasm of breast: Secondary | ICD-10-CM

## 2022-02-26 ENCOUNTER — Other Ambulatory Visit: Payer: Self-pay

## 2022-02-26 ENCOUNTER — Other Ambulatory Visit (HOSPITAL_COMMUNITY): Payer: Self-pay

## 2022-02-26 ENCOUNTER — Encounter: Payer: Self-pay | Admitting: Family Medicine

## 2022-02-26 ENCOUNTER — Ambulatory Visit (INDEPENDENT_AMBULATORY_CARE_PROVIDER_SITE_OTHER): Payer: 59 | Admitting: Family Medicine

## 2022-02-26 ENCOUNTER — Ambulatory Visit
Admission: RE | Admit: 2022-02-26 | Discharge: 2022-02-26 | Disposition: A | Discharge status: Home / Self Care | Payer: 59 | Source: Ambulatory Visit

## 2022-02-26 VITALS — BP 131/65 | HR 95 | Temp 97.8°F | Ht 63.25 in | Wt 165.0 lb

## 2022-02-26 DIAGNOSIS — L309 Dermatitis, unspecified: Secondary | ICD-10-CM

## 2022-02-26 DIAGNOSIS — F411 Generalized anxiety disorder: Secondary | ICD-10-CM

## 2022-02-26 DIAGNOSIS — Z0000 Encounter for general adult medical examination without abnormal findings: Secondary | ICD-10-CM | POA: Diagnosis not present

## 2022-02-26 DIAGNOSIS — E2839 Other primary ovarian failure: Secondary | ICD-10-CM | POA: Diagnosis not present

## 2022-02-26 DIAGNOSIS — Z1231 Encounter for screening mammogram for malignant neoplasm of breast: Secondary | ICD-10-CM

## 2022-02-26 DIAGNOSIS — E559 Vitamin D deficiency, unspecified: Secondary | ICD-10-CM

## 2022-02-26 DIAGNOSIS — E7800 Pure hypercholesterolemia, unspecified: Secondary | ICD-10-CM

## 2022-02-26 DIAGNOSIS — D649 Anemia, unspecified: Secondary | ICD-10-CM | POA: Diagnosis not present

## 2022-02-26 DIAGNOSIS — Z23 Encounter for immunization: Secondary | ICD-10-CM | POA: Diagnosis not present

## 2022-02-26 DIAGNOSIS — R7303 Prediabetes: Secondary | ICD-10-CM | POA: Diagnosis not present

## 2022-02-26 LAB — CBC WITH DIFFERENTIAL/PLATELET
Basophils Absolute: 0.1 10*3/uL (ref 0.0–0.1)
Basophils Relative: 0.9 % (ref 0.0–3.0)
Eosinophils Absolute: 0.1 10*3/uL (ref 0.0–0.7)
Eosinophils Relative: 1.2 % (ref 0.0–5.0)
HCT: 36.7 % (ref 36.0–46.0)
Hemoglobin: 12.2 g/dL (ref 12.0–15.0)
Lymphocytes Relative: 21.6 % (ref 12.0–46.0)
Lymphs Abs: 1.5 10*3/uL (ref 0.7–4.0)
MCHC: 33.2 g/dL (ref 30.0–36.0)
MCV: 91.4 fl (ref 78.0–100.0)
Monocytes Absolute: 0.3 10*3/uL (ref 0.1–1.0)
Monocytes Relative: 4.6 % (ref 3.0–12.0)
Neutro Abs: 5 10*3/uL (ref 1.4–7.7)
Neutrophils Relative %: 71.7 % (ref 43.0–77.0)
Platelets: 381 10*3/uL (ref 150.0–400.0)
RBC: 4.02 Mil/uL (ref 3.87–5.11)
RDW: 14 % (ref 11.5–15.5)
WBC: 7 10*3/uL (ref 4.0–10.5)

## 2022-02-26 LAB — COMPREHENSIVE METABOLIC PANEL
ALT: 15 U/L (ref 0–35)
AST: 20 U/L (ref 0–37)
Albumin: 4.7 g/dL (ref 3.5–5.2)
Alkaline Phosphatase: 84 U/L (ref 39–117)
BUN: 14 mg/dL (ref 6–23)
CO2: 32 mEq/L (ref 19–32)
Calcium: 9.8 mg/dL (ref 8.4–10.5)
Chloride: 102 mEq/L (ref 96–112)
Creatinine, Ser: 0.82 mg/dL (ref 0.40–1.20)
GFR: 75.22 mL/min (ref >60.00)
Glucose, Bld: 93 mg/dL (ref 70–99)
Potassium: 4.6 mEq/L (ref 3.5–5.1)
Sodium: 141 mEq/L (ref 135–145)
Total Bilirubin: 0.6 mg/dL (ref 0.2–1.2)
Total Protein: 7.4 g/dL (ref 6.0–8.3)

## 2022-02-26 LAB — LIPID PANEL
Cholesterol: 217 mg/dL — ABNORMAL HIGH (ref 0–200)
HDL: 58.4 mg/dL (ref >39.00)
LDL Cholesterol: 140 mg/dL — ABNORMAL HIGH (ref 0–99)
NonHDL: 158.41
Total CHOL/HDL Ratio: 4
Triglycerides: 90 mg/dL (ref 0.0–149.0)
VLDL: 18 mg/dL (ref 0.0–40.0)

## 2022-02-26 LAB — IRON: Iron: 91 ug/dL (ref 42–145)

## 2022-02-26 LAB — VITAMIN D 25 HYDROXY (VIT D DEFICIENCY, FRACTURES): VITD: 66.29 ng/mL (ref 30.00–100.00)

## 2022-02-26 LAB — HEMOGLOBIN A1C: Hgb A1c MFr Bld: 5.8 % (ref 4.6–6.5)

## 2022-02-26 LAB — TSH: TSH: 1.61 u[IU]/mL (ref 0.35–5.50)

## 2022-02-26 LAB — FERRITIN: Ferritin: 156.5 ng/mL (ref 10.0–291.0)

## 2022-02-26 MED ORDER — SERTRALINE HCL 50 MG PO TABS
75.0000 mg | ORAL_TABLET | Freq: Every day | ORAL | 3 refills | Status: DC
  Filled 2022-02-26 (×2): qty 135, 90d supply, fill #0
  Filled 2022-07-12: qty 135, 90d supply, fill #1
  Filled 2022-11-03: qty 135, 90d supply, fill #2
  Filled 2023-02-10: qty 135, 90d supply, fill #3

## 2022-02-26 MED ORDER — FLUOCINONIDE 0.05 % EX CREA
1.0000 | TOPICAL_CREAM | Freq: Two times a day (BID) | CUTANEOUS | 0 refills | Status: DC
  Filled 2022-02-26: qty 30, 15d supply, fill #0

## 2022-02-26 MED ORDER — FLUTICASONE PROPIONATE 50 MCG/ACT NA SUSP
2.0000 | Freq: Every day | NASAL | 3 refills | Status: DC
  Filled 2022-02-26: qty 48, 90d supply, fill #0
  Filled 2022-11-03: qty 48, 90d supply, fill #1
  Filled 2023-02-10: qty 48, 90d supply, fill #2

## 2022-02-26 NOTE — Assessment & Plan Note (Addendum)
Reviewed health habits including diet and exercise and skin cancer prevention Reviewed appropriate screening tests for age  Also reviewed health mt list, fam hx and immunization status , as well as social and family history   See HPI Labs ordered Rev dep/anx screen and addressed  Mammogram planned today  Ordered dexa- pt will schedule / disc fall prevention  Urged to start exercise program and continue vit D Colonoscopy utd from 5/206  .

## 2022-02-26 NOTE — Patient Instructions (Addendum)
Reach out to [facility] counseling team  Get started with them   Go up to 75 mg daily on zoloft  If any side effects or problems let us know   Follow up in about a month   Get a yoga mat and get started   Pneumonia vaccine today   Labs today     [facility]

## 2022-02-26 NOTE — Progress Notes (Signed)
Subjective:    Patient ID: Carolyn Matthews, female    DOB: [birth year], 67 y.o.   MRN: [id number]  HPI Here for health maintenance exam and to review chronic medical problems    Wt Readings from Last 3 Encounters:  02/26/22 165 lb (74.8 kg)  02/06/22 165 lb (74.8 kg)  12/19/21 165 lb (74.8 kg)   29.00 kg/m  Went down to 160 with intermittent fasting  Off track and going back on it now   Not doing great  Wants to stay in the house  Has not driven since her mother died Very anxious   Immunization History  Administered Date(s) Administered   Hepatitis A 08/11/2012   PFIZER(Purple Top)SARS-COV-2 Vaccination 05/13/2019, 06/03/2019, 02/27/2020   Pfizer Covid-19 Vaccine Bivalent Booster 67yrs & up 08/01/2021   Td 02/03/2006   Tdap 02/20/2017   Zoster Recombinat (Shingrix) 06/06/2020, 11/29/2020   Health Maintenance Due  Topic Date Due   HIV Screening  Never done   Hepatitis C Screening  Never done   COVID-19 Vaccine (5 - 2023-24 season) 10/04/2021   Pneumonia Vaccine 93+ Years old (1 - PCV) Never done   DEXA SCAN  Never done   Pna vaccine : wants to get today   Dexa -wants to do first one Falls: none  Fractures-none  Supplements - both D ,some  Exercise -finally going to start yoga (at home)  [facility] a program    Mammogram 01/2021-is scheduled for today  Self breast exam  : no lumps  Daughter had breast cancer  Colonoscopy 06/2014 10 y recall   Mood PHQ    02/26/2022   12:14 PM 01/21/2021    3:26 PM 01/20/2020   12:20 PM 12/10/2018    9:20 AM 12/02/2017    9:05 AM  Depression screen PHQ 2/9  Decreased Interest 2 0 0 0 0  Down, Depressed, Hopeless 1 0 0 0 1  PHQ - 2 Score 3 0 0 0 1  Altered sleeping 1 0 0 0   Tired, decreased energy 0 0 0 0   Change in appetite 0 0 0 2   Feeling bad or failure about yourself  0 0 0 1   Trouble concentrating 1 0 0 0   Moving slowly or fidgety/restless 0 0 0 0   Suicidal thoughts 0 0 0 0   PHQ-9 Score 5 0 0 3    Difficult doing work/chores Somewhat difficult Not difficult at all Not difficult at all      GAD 7    02/26/2022   12:14 PM  GAD 7 : Generalized Anxiety Score  Nervous, Anxious, on Edge 1  Control/stop worrying 0  Worry too much - different things 0  Trouble relaxing 0  Restless 0  Easily annoyed or irritable 0  Afraid - awful might happen 1  Total GAD 7 Score 2  Anxiety Difficulty Somewhat difficult     Has h/o anxiety disorder   Husb is supportive   Started feeling down and anxious since mother died  Was going to call [facility]   Not driving -too anxious  Husband takes her everywhere  Works at home   Loss of motivation  Some degree of grief    BP Readings from Last 3 Encounters:  02/26/22 (!) 144/70  02/06/22 138/60  12/19/21 104/62   Pulse Readings from Last 3 Encounters:  02/26/22 95  02/06/22 64  12/19/21 64   HTN in the family   Past h/o anemia Lab Results  Component Value Date   WBC 8.5 01/21/2021   HGB 11.5 (L) 01/21/2021   HCT 34.9 (L) 01/21/2021   MCV 91.3 01/21/2021   PLT 363.0 01/21/2021   Lab Results  Component Value Date   IRON 69 07/16/2020   FERRITIN 149.5 07/16/2020    Hyperlipidemia Lab Results  Component Value Date   CHOL 215 (H) 01/21/2021   HDL 49.60 01/21/2021   LDLCALC 141 (H) 01/21/2021   TRIG 121.0 01/21/2021   CHOLHDL 4 01/21/2021  Diet controlled  Prediabetes Lab Results  Component Value Date   HGBA1C 6.1 (H) 01/23/2021    Off intermittent fasting for a mo  Not eating the right things now but getting back on track Husband eats junk food, is hard on her   Needs refill of lidex for ear skin condition  Orig from dermatologist and chronic   Patient Active Problem List   Diagnosis Date Noted   Estrogen deficiency 02/26/2022   Degenerative cervical disc 02/07/2022   Allergic rhinitis 04/06/2020   OSA (obstructive sleep apnea) 01/20/2020   Lumbar degenerative disc disease 12/08/2016   Lumbar back pain with  radiculopathy affecting left lower extremity 12/02/2016   Patellar subluxation, left, initial encounter 06/23/2016   Vitamin D deficiency 10/03/2015   Encounter for vitamin deficiency screening 11/14/2014   Colon cancer screening 08/11/2012   Anxiety disorder 02/26/2011   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   Dermatitis 06/24/2010   Routine general medical examination at a health care facility 05/23/2010   MVP (mitral valve prolapse)    Chronic headaches    Seasonal allergies    Family history of breast cancer in female    Prediabetes 04/04/2009   Hyperlipidemia 11/22/2008   Anemia, unspecified 02/12/2007   OVERACTIVE BLADDER 01/11/2007   FIBROCYSTIC BREAST DISEASE 01/11/2007   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   Past Surgical History:  Procedure Laterality Date   ANKLE FRACTURE SURGERY  05/2003   BREAST BIOPSY  03/2004   Negative   BREAST BIOPSY Right 12/27/2014   fat necrosis / fibrocystic changes   BREAST CYST ASPIRATION  05/2007   Bilateral-benign   BREAST LUMPECTOMY  12/1977   TOTAL ABDOMINAL HYSTERECTOMY  09/1996   Fibroids   Social History   Tobacco Use   Smoking status: Never   Smokeless tobacco: Never  Vaping Use   Vaping Use: Never used  Substance Use Topics   Alcohol use: Yes    Alcohol/week: 0.0 standard drinks of alcohol    Comment: occasional use   Drug use: No   Family History  Problem Relation Age of Onset   Diabetes Father    Hypertension Mother    Breast cancer Daughter        (inflammatory)-now has brain tumor   Diabetes Paternal Grandmother    Breast cancer Other        mat great aunt   Breast cancer  Daughter        2nd daughter with breast cancer    Colon cancer Neg Hx    Rectal cancer Neg Hx  Stomach cancer Neg Hx    Esophageal cancer Neg Hx    Allergies  Allergen Reactions   Eggs Or Egg-Derived Products Other (See Comments)    REACTION: SEVERE REACTION PV 4-22 pt states she gets flu like symptoms all but fever, no anaphylasis   Hepatitis A Vaccine Other (See Comments)    ? Allergic reaction; itching,weakness almost immediate after taking 1st vaccine   Current Outpatient Medications on File Prior to Visit  Medication Sig Dispense Refill   conjugated estrogens (PREMARIN) vaginal cream Place 1 Applicatorful vaginally 2 (two) times a week. 0.5 gram vaginally at bedtime twice weekly 30 g 3   hydrOXYzine (ATARAX/VISTARIL) 25 MG tablet Take 1-2 tablets by mouth every 8 hours as needed for anxiety and panic 30 tablet 3   meloxicam (MOBIC) 7.5 MG tablet Take 1 tablet (7.5 mg total) by mouth daily. 30 tablet 0   Multiple Vitamin (MULTIVITAMIN) tablet Take 1 tablet by mouth daily.     No current facility-administered medications on file prior to visit.    Review of Systems  Constitutional:  Negative for activity change, appetite change, fatigue, fever and unexpected weight change.  HENT:  Negative for congestion, ear pain, rhinorrhea, sinus pressure and sore throat.   Eyes:  Negative for pain, redness and visual disturbance.  Respiratory:  Negative for cough, shortness of breath and wheezing.   Cardiovascular:  Negative for chest pain and palpitations.  Gastrointestinal:  Negative for abdominal pain, blood in stool, constipation and diarrhea.  Endocrine: Negative for polydipsia and polyuria.  Genitourinary:  Negative for dysuria, frequency and urgency.  Musculoskeletal:  Negative for arthralgias, back pain and myalgias.  Skin:  Negative for pallor and rash.  Allergic/Immunologic: Negative for environmental allergies.  Neurological:  Negative for dizziness, syncope and headaches.   Hematological:  Negative for adenopathy. Does not bruise/bleed easily.  Psychiatric/Behavioral:  Positive for dysphoric mood and sleep disturbance. Negative for decreased concentration, self-injury and suicidal ideas. The patient is nervous/anxious.        Objective:   Physical Exam Constitutional:      General: She is not in acute distress.    Appearance: Normal appearance. She is well-developed. She is not ill-appearing or diaphoretic.     Comments: Overweight   HENT:     Head: Normocephalic and atraumatic.     Right Ear: Tympanic membrane, ear canal and external ear normal.     Left Ear: Tympanic membrane, ear canal and external ear normal.     Nose: Nose normal. No congestion.     Mouth/Throat:     Mouth: Mucous membranes are moist.     Pharynx: Oropharynx is clear. No posterior oropharyngeal erythema.  Eyes:     General: No scleral icterus.    Extraocular Movements: Extraocular movements intact.     Conjunctiva/sclera: Conjunctivae normal.     Pupils: Pupils are equal, round, and reactive to light.  Neck:     Thyroid: No thyromegaly.     Vascular: No carotid bruit or JVD.  Cardiovascular:     Rate and Rhythm: Normal rate and regular rhythm.     Pulses: Normal pulses.     Heart sounds: Normal heart sounds.     No gallop.  Pulmonary:     Effort: Pulmonary effort is normal. No respiratory distress.     Breath sounds: Normal breath sounds. No wheezing.     Comments: Good air exch Chest:     Chest wall: No tenderness.  Abdominal:  General: Bowel sounds are normal. There is no distension or abdominal bruit.     Palpations: Abdomen is soft. There is no mass.     Tenderness: There is no abdominal tenderness.     Hernia: No hernia is present.  Genitourinary:    Comments: Breast exam: No mass, nodules, thickening, tenderness, bulging, retraction, inflamation, nipple discharge or skin changes noted.  No axillary or clavicular LA.     Musculoskeletal:        General: No  tenderness. Normal range of motion.     Cervical back: Normal range of motion and neck supple. No rigidity. No muscular tenderness.     Right lower leg: No edema.     Left lower leg: No edema.     Comments: No kyphosis   Lymphadenopathy:     Cervical: No cervical adenopathy.  Skin:    General: Skin is warm and dry.     Coloration: Skin is not pale.     Findings: No erythema or rash.     Comments: Scattered lentigines   Neurological:     Mental Status: She is alert. Mental status is at baseline.     Cranial Nerves: No cranial nerve deficit.     Motor: No abnormal muscle tone.     Coordination: Coordination normal.     Gait: Gait normal.     Deep Tendon Reflexes: Reflexes are normal and symmetric. Reflexes normal.  Psychiatric:        Attention and Perception: Attention normal.        Mood and Affect: Mood is anxious. Affect is blunt.        Cognition and Memory: Cognition and memory normal.     Comments: Candidly discusses symptoms and stressors             Assessment & Plan:   Problem List Items Addressed This Visit       Musculoskeletal and Integument   Dermatitis    Behind ears  Refilled her lidex cream orig from derm helpful        Other   Anemia, unspecified    Lab today  Energy level seems stable      Relevant Orders   CBC with Differential/Platelet   Iron   Ferritin   Anxiety disorder    Worsened anxiety since losing her mother  Has not driven Some features of agoraphobia/does not want to leave house  Reviewed stressors/ coping techniques/symptoms/ support sources/ tx options and side effects in detail today  Plans to start counseling with [facility] program  Will inc sertraline to 75 mg daily  Inst to update if side eff or worse F/u in about a month        Relevant Medications   sertraline (ZOLOFT) 50 MG tablet   Estrogen deficiency   Relevant Orders   DG Bone Density   Hyperlipidemia    Disc goals for lipids and reasons to control them Rev  last labs with pt Rev low sat fat diet in detail Labs today  Diet controlled - per pt diet worse in past mo      Relevant Orders   Lipid panel   Comprehensive metabolic panel   Prediabetes    Lab today A1c ordered  disc imp of low glycemic diet and wt loss to prevent DM2  Diet worse in past mo Ready to get back on track      Relevant Orders   Hemoglobin A1c   Routine general medical examination at a health  care facility - Primary    Reviewed health habits including diet and exercise and skin cancer prevention Reviewed appropriate screening tests for age  Also reviewed health mt list, fam hx and immunization status , as well as social and family history   See HPI Labs ordered Rev dep/anx screen and addressed  Mammogram planned today  Ordered dexa- pt will schedule / disc fall prevention  Urged to start exercise program and continue vit D Colonoscopy utd from 5/206  .      Relevant Orders   TSH   Lipid panel   Comprehensive metabolic panel   CBC with Differential/Platelet   Pneumococcal conjugate vaccine 20-valent (Prevnar 20) (Completed)   Vitamin D deficiency    D level added to labs Disc imp to bone and overall health      Relevant Orders   VITAMIN D 25 Hydroxy (Vit-D Deficiency, Fractures)   Other Visit Diagnoses     Need for vaccination against Streptococcus pneumoniae       Relevant Orders   Pneumococcal conjugate vaccine 20-valent (Prevnar 20) (Completed)

## 2022-02-26 NOTE — Assessment & Plan Note (Signed)
Behind ears  Refilled her lidex cream orig from derm helpful

## 2022-02-26 NOTE — Assessment & Plan Note (Signed)
D level added to labs ?Disc imp to bone and overall health ?

## 2022-02-26 NOTE — Assessment & Plan Note (Signed)
Lab today A1c ordered  disc imp of low glycemic diet and wt loss to prevent DM2  Diet worse in past mo Ready to get back on track

## 2022-02-26 NOTE — Assessment & Plan Note (Signed)
Lab today  Energy level seems stable

## 2022-02-26 NOTE — Assessment & Plan Note (Signed)
Worsened anxiety since losing her mother  Has not driven Some features of agoraphobia/does not want to leave house  Reviewed stressors/ coping techniques/symptoms/ support sources/ tx options and side effects in detail today  Plans to start counseling with [facility] program  Will inc sertraline to 75 mg daily  Inst to update if side eff or worse F/u in about a month

## 2022-02-26 NOTE — Assessment & Plan Note (Signed)
Disc goals for lipids and reasons to control them Rev last labs with pt Rev low sat fat diet in detail Labs today  Diet controlled - per pt diet worse in past mo

## 2022-02-27 ENCOUNTER — Other Ambulatory Visit: Payer: Self-pay

## 2022-02-27 ENCOUNTER — Other Ambulatory Visit (HOSPITAL_COMMUNITY): Payer: Self-pay

## 2022-03-05 ENCOUNTER — Other Ambulatory Visit (HOSPITAL_COMMUNITY): Payer: Self-pay

## 2022-03-05 ENCOUNTER — Encounter: Payer: Self-pay | Admitting: Family Medicine

## 2022-03-05 MED ORDER — FLUOCINONIDE 0.05 % EX CREA
1.0000 | TOPICAL_CREAM | Freq: Two times a day (BID) | CUTANEOUS | 0 refills | Status: AC
  Filled 2022-03-05 – 2022-11-03 (×2): qty 30, 15d supply, fill #0

## 2022-03-05 NOTE — Addendum Note (Signed)
Addended by: Tammi Sou on: 03/05/2022 10:04 AM   Modules accepted: Orders

## 2022-04-03 ENCOUNTER — Ambulatory Visit: Payer: 59 | Admitting: Family Medicine

## 2022-04-30 ENCOUNTER — Ambulatory Visit: Payer: 59 | Admitting: Family Medicine

## 2022-04-30 ENCOUNTER — Encounter: Payer: Self-pay | Admitting: Family Medicine

## 2022-07-12 ENCOUNTER — Other Ambulatory Visit (HOSPITAL_COMMUNITY): Payer: Self-pay

## 2022-07-14 ENCOUNTER — Other Ambulatory Visit (HOSPITAL_COMMUNITY): Payer: Self-pay

## 2022-07-28 DIAGNOSIS — S0502XA Injury of conjunctiva and corneal abrasion without foreign body, left eye, initial encounter: Secondary | ICD-10-CM | POA: Diagnosis not present

## 2022-07-28 DIAGNOSIS — X58XXXA Exposure to other specified factors, initial encounter: Secondary | ICD-10-CM | POA: Diagnosis not present

## 2022-09-03 ENCOUNTER — Telehealth: Payer: 59 | Admitting: Nurse Practitioner

## 2022-09-03 DIAGNOSIS — J0140 Acute pansinusitis, unspecified: Secondary | ICD-10-CM

## 2022-09-03 MED ORDER — AMOXICILLIN-POT CLAVULANATE 875-125 MG PO TABS: 1.0000 | ORAL_TABLET | Freq: Two times a day (BID) | ORAL | 0 refills | Status: AC

## 2022-09-03 NOTE — Progress Notes (Signed)
E-Visit for Sinus Problems  We are sorry that you are not feeling well.  Here is how we plan to help!  Based on what you have shared with me it looks like you have sinusitis.  Sinusitis is inflammation and infection in the sinus cavities of the head.  Based on your presentation I believe you most likely have Acute Bacterial Sinusitis.  This is an infection caused by bacteria and is treated with antibiotics. I have prescribed Augmentin 875mg/125mg one tablet twice daily with food, for 7 days. You may use an oral decongestant such as Mucinex D or if you have glaucoma or high blood pressure use plain Mucinex. Saline nasal spray help and can safely be used as often as needed for congestion.  If you develop worsening sinus pain, fever or notice severe headache and vision changes, or if symptoms are not better after completion of antibiotic, please schedule an appointment with a health care provider.    Sinus infections are not as easily transmitted as other respiratory infection, however we still recommend that you avoid close contact with loved ones, especially the very young and elderly.  Remember to wash your hands thoroughly throughout the day as this is the number one way to prevent the spread of infection!  Home Care: Only take medications as instructed by your medical team. Complete the entire course of an antibiotic. Do not take these medications with alcohol. A steam or ultrasonic humidifier can help congestion.  You can place a towel over your head and breathe in the steam from hot water coming from a faucet. Avoid close contacts especially the very young and the elderly. Cover your mouth when you cough or sneeze. Always remember to wash your hands.  Get Help Right Away If: You develop worsening fever or sinus pain. You develop a severe head ache or visual changes. Your symptoms persist after you have completed your treatment plan.  Make sure you Understand these instructions. Will watch  your condition. Will get help right away if you are not doing well or get worse.  Thank you for choosing an e-visit.  Your e-visit answers were reviewed by a board certified advanced clinical practitioner to complete your personal care plan. Depending upon the condition, your plan could have included both over the counter or prescription medications.  Please review your pharmacy choice. Make sure the pharmacy is open so you can pick up prescription now. If there is a problem, you may contact your provider through MyChart messaging and have the prescription routed to another pharmacy.  Your safety is important to us. If you have drug allergies check your prescription carefully.   For the next 24 hours you can use MyChart to ask questions about today's visit, request a non-urgent call back, or ask for a work or school excuse. You will get an email in the next two days asking about your experience. I hope that your e-visit has been valuable and will speed your recovery.   Meds ordered this encounter  Medications   amoxicillin-clavulanate (AUGMENTIN) 875-125 MG tablet    Sig: Take 1 tablet by mouth 2 (two) times daily for 7 days. Take with food    Dispense:  14 tablet    Refill:  0     I spent approximately 5 minutes reviewing the patient's history, current symptoms and coordinating their care today.   

## 2022-09-04 ENCOUNTER — Encounter: Payer: Self-pay | Admitting: Family Medicine

## 2022-09-05 NOTE — Telephone Encounter (Signed)
Patients daughter sent a MyChart message through her account asking for any recommendations for helping patient get better. Called and spoke with daughter she stated the patient is having very bad body aches, chest pain, slightly short of breath, congestion. She denies fever. She stated the symptoms started increasing in intensity yesterday and have not eased up.  Advised patients daughter due to red flag words she needs to be evaluated by ER or UC. She advised she would notify patient and get her evaluated. She requested I still send Dr. Milinda Antis a message for any recommendation to make patient more comfortable while she is dealing with Covid. Please advise

## 2022-09-10 ENCOUNTER — Other Ambulatory Visit: Payer: 59

## 2022-09-23 ENCOUNTER — Encounter: Payer: Self-pay | Admitting: Family Medicine

## 2022-09-23 ENCOUNTER — Ambulatory Visit (INDEPENDENT_AMBULATORY_CARE_PROVIDER_SITE_OTHER): Payer: 59 | Admitting: Family Medicine

## 2022-09-23 ENCOUNTER — Ambulatory Visit (INDEPENDENT_AMBULATORY_CARE_PROVIDER_SITE_OTHER)
Admission: RE | Admit: 2022-09-23 | Discharge: 2022-09-23 | Disposition: A | Discharge status: Home / Self Care | Payer: 59 | Source: Ambulatory Visit | Attending: Family Medicine | Admitting: Family Medicine

## 2022-09-23 VITALS — BP 145/77 | HR 74 | Temp 97.6°F | Ht 63.25 in | Wt 173.2 lb

## 2022-09-23 DIAGNOSIS — U071 COVID-19: Secondary | ICD-10-CM

## 2022-09-23 DIAGNOSIS — R0781 Pleurodynia: Secondary | ICD-10-CM | POA: Diagnosis not present

## 2022-09-23 DIAGNOSIS — J0110 Acute frontal sinusitis, unspecified: Secondary | ICD-10-CM | POA: Diagnosis not present

## 2022-09-23 DIAGNOSIS — I700 Atherosclerosis of aorta: Secondary | ICD-10-CM | POA: Insufficient documentation

## 2022-09-23 MED ORDER — AMOXICILLIN-POT CLAVULANATE 875-125 MG PO TABS: 1.0000 | ORAL_TABLET | Freq: Two times a day (BID) | ORAL | 0 refills | Status: DC

## 2022-09-23 MED ORDER — BENZONATATE 200 MG PO CAPS: 200.0000 mg | ORAL_CAPSULE | Freq: Three times a day (TID) | ORAL | 1 refills | Status: DC | PRN

## 2022-09-23 MED ORDER — PREDNISONE 20 MG PO TABS: 20.0000 mg | ORAL_TABLET | Freq: Every day | ORAL | 0 refills | Status: DC

## 2022-09-23 NOTE — Assessment & Plan Note (Signed)
Suspect early bacterial sinusitis following covid  Will treatment with augmentin and prednisone   Update if not starting to improve in a week or if worsening  Call back and Er precautions noted in detail today

## 2022-09-23 NOTE — Assessment & Plan Note (Signed)
Still dealing with head and chest congestion and cough  Also some chest wall soreness  Reassuring exam Cxr is clear  Suspect sinusitis-will treat with augmentin  Prednisone for congestion/ chest soreness  Tessalon for cough Update if not starting to improve in a week or if worsening  Call back and Er precautions noted in detail today

## 2022-09-23 NOTE — Progress Notes (Signed)
Subjective:    Patient ID: Carolyn Matthews, female    DOB: May 31, 1955, 67 y.o.   MRN: [id number]  HPI  Wt Readings from Last 3 Encounters:  09/23/22 173 lb 4 oz (78.6 kg)  02/26/22 165 lb (74.8 kg)  02/06/22 165 lb (74.8 kg)   30.45 kg/m  Vitals:   09/23/22 1027 09/23/22 1049  BP: (!) 144/70 (!) 145/77  Pulse: 74   Temp: 97.6 F (36.4 C)   SpO2: 97%     Pt presents for follow up of covid 19 Symptoms started in late July  Congestion -chest and nasal  Yellow mucous to clear  No fever   Some shortness of breath  A little wheezy /tight - cannot tell if anxious symptom or now   Hurts to take a deep breath  Middle of her chest   Coughing pretty hard -phlegm is hard to get out  Is interfering with sleep  Needs more fluids   Some sinus headache /facial pain  Forehead   Over the counter Mucinex- helps (thinks it may be DM)    Cxr  DG Chest 2 View  Result Date: 09/23/2022 CLINICAL DATA:  67 year old female with history of chest soreness and pleuritic chest pain. Post COVID cough. EXAM: CHEST - 2 VIEW COMPARISON:  Chest x-ray 07/16/2020. FINDINGS: Lung volumes are normal. No consolidative airspace disease. No pleural effusions. No pneumothorax. No pulmonary nodule or mass noted. Pulmonary vasculature and the cardiomediastinal silhouette are within normal limits. Atherosclerosis in the thoracic aorta. IMPRESSION: No active cardiopulmonary disease. Electronically Signed   By: Trudie Reed M.D.   On: 09/23/2022 12:21      Patient Active Problem List   Diagnosis Date Noted   COVID-19 09/23/2022   Estrogen deficiency 02/26/2022   Degenerative cervical disc 02/07/2022   Allergic rhinitis 04/06/2020   OSA (obstructive sleep apnea) 01/20/2020   Lumbar degenerative disc disease 12/08/2016   Lumbar back pain with radiculopathy affecting left lower extremity 12/02/2016   Patellar subluxation, left, initial encounter 06/23/2016   Vitamin D deficiency 10/03/2015    Encounter for vitamin deficiency screening 11/14/2014   Colon cancer screening 08/11/2012   Sinusitis 12/25/2011   Anxiety disorder 02/26/2011   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   Dermatitis 06/24/2010   Routine general medical examination at a health care facility 05/23/2010   MVP (mitral valve prolapse)    Chronic headaches    Seasonal allergies    Family history of breast cancer in female    Prediabetes 04/04/2009   Hyperlipidemia 11/22/2008   Anemia, unspecified 02/12/2007   OVERACTIVE BLADDER 01/11/2007   FIBROCYSTIC BREAST DISEASE 01/11/2007   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   Past Surgical History:  Procedure Laterality Date   ANKLE FRACTURE SURGERY  05/2003   BREAST BIOPSY  03/2004   Negative   BREAST BIOPSY Right 12/27/2014   fat necrosis / fibrocystic changes   BREAST CYST ASPIRATION  05/2007   Bilateral-benign   BREAST LUMPECTOMY  12/1977   TOTAL ABDOMINAL HYSTERECTOMY  09/1996   Fibroids   Social  History   Tobacco Use   Smoking status: Never   Smokeless tobacco: Never  Vaping Use   Vaping status: Never Used  Substance Use Topics   Alcohol use: Yes    Alcohol/week: 0.0 standard drinks of alcohol    Comment: occasional use   Drug use: No   Family History  Problem Relation Age of Onset   Diabetes Father    Hypertension Mother    Breast cancer Daughter        (inflammatory)-now has brain tumor   Diabetes Paternal Grandmother    Breast cancer Other        mat great aunt   Breast cancer Daughter        2nd daughter with breast cancer    Colon cancer Neg Hx    Rectal cancer Neg Hx    Stomach cancer Neg Hx     Esophageal cancer Neg Hx    Allergies  Allergen Reactions   Egg-Derived Products Other (See Comments)    REACTION: SEVERE REACTION PV 4-22 pt states she gets flu like symptoms all but fever, no anaphylasis   Hepatitis A Vaccine Other (See Comments)    ? Allergic reaction; itching,weakness almost immediate after taking 1st vaccine   Current Outpatient Medications on File Prior to Visit  Medication Sig Dispense Refill   conjugated estrogens (PREMARIN) vaginal cream Place 1 Applicatorful vaginally 2 (two) times a week. 0.5 gram vaginally at bedtime twice weekly 30 g 3   fluocinonide cream (LIDEX) 0.05 % Apply topically 2 (two) times daily. 30 g 0   fluticasone (FLONASE) 50 MCG/ACT nasal spray USE 2 SPRAYS IN EACH NOSTRIL ONCE A DAY 48 g 3   hydrOXYzine (ATARAX/VISTARIL) 25 MG tablet Take 1-2 tablets by mouth every 8 hours as needed for anxiety and panic 30 tablet 3   Multiple Vitamin (MULTIVITAMIN) tablet Take 1 tablet by mouth daily.     sertraline (ZOLOFT) 50 MG tablet Take 1.5 tablets (75 mg total) by mouth daily. 135 tablet 3   No current facility-administered medications on file prior to visit.    Review of Systems  Constitutional:  Positive for fatigue. Negative for activity change, appetite change, fever and unexpected weight change.  HENT:  Positive for congestion, sinus pressure and sinus pain. Negative for ear pain, rhinorrhea, sore throat and trouble swallowing.   Eyes:  Negative for pain, redness and visual disturbance.  Respiratory:  Positive for cough and chest tightness. Negative for shortness of breath and wheezing.        Chest wall pain   Cardiovascular:  Negative for chest pain and palpitations.  Gastrointestinal:  Negative for abdominal pain, blood in stool, constipation and diarrhea.  Endocrine: Negative for polydipsia and polyuria.  Genitourinary:  Negative for dysuria, frequency and urgency.  Musculoskeletal:  Negative for arthralgias, back pain and myalgias.   Skin:  Negative for pallor and rash.  Allergic/Immunologic: Negative for environmental allergies.  Neurological:  Negative for dizziness, syncope and headaches.  Hematological:  Negative for adenopathy. Does not bruise/bleed easily.  Psychiatric/Behavioral:  Negative for decreased concentration and dysphoric mood. The patient is not nervous/anxious.        Objective:   Physical Exam Constitutional:      General: She is not in acute distress.    Appearance: Normal appearance. She is well-developed. She is obese. She is not ill-appearing or diaphoretic.  HENT:     Head: Normocephalic and atraumatic.     Comments: Mild bilateral frontal sinus tenderness  Right Ear: Tympanic membrane, ear canal and external ear normal.     Left Ear: Tympanic membrane, ear canal and external ear normal.     Nose: Congestion present.     Mouth/Throat:     Mouth: Mucous membranes are moist.     Pharynx: Oropharynx is clear. No posterior oropharyngeal erythema.  Eyes:     General:        Right eye: No discharge.        Left eye: No discharge.     Conjunctiva/sclera: Conjunctivae normal.     Pupils: Pupils are equal, round, and reactive to light.  Neck:     Thyroid: No thyromegaly.     Vascular: No carotid bruit or JVD.  Cardiovascular:     Rate and Rhythm: Normal rate and regular rhythm.     Heart sounds: Normal heart sounds.     No gallop.  Pulmonary:     Effort: Pulmonary effort is normal. No respiratory distress.     Breath sounds: Normal breath sounds. No stridor. No wheezing, rhonchi or rales.     Comments: Bs are harsh  Tenderness over anterior mid chest and sternum No crepitus or step off noted  No skin change  Abdominal:     General: There is no distension or abdominal bruit.     Palpations: Abdomen is soft.  Musculoskeletal:     Cervical back: Normal range of motion and neck supple.     Right lower leg: No edema.     Left lower leg: No edema.  Lymphadenopathy:     Cervical: No  cervical adenopathy.  Skin:    General: Skin is warm and dry.     Coloration: Skin is not pale.     Findings: No rash.  Neurological:     Mental Status: She is alert.     Cranial Nerves: No cranial nerve deficit.     Coordination: Coordination normal.     Deep Tendon Reflexes: Reflexes are normal and symmetric. Reflexes normal.  Psychiatric:        Mood and Affect: Mood normal.           Assessment & Plan:   Problem List Items Addressed This Visit       Respiratory   Sinusitis    Suspect early bacterial sinusitis following covid  Will treatment with augmentin and prednisone   Update if not starting to improve in a week or if worsening  Call back and Er precautions noted in detail today          Other   COVID-19 - Primary    Still dealing with head and chest congestion and cough  Also some chest wall soreness  Reassuring exam Cxr is clear  Suspect sinusitis-will treat with augmentin  Prednisone for congestion/ chest soreness  Tessalon for cough Update if not starting to improve in a week or if worsening  Call back and Er precautions noted in detail today        Relevant Orders   DG Chest 2 View (Completed)

## 2022-09-23 NOTE — Patient Instructions (Addendum)
Take care of yourself   Drink fluids and rest  mucinex DM is good for cough and congestion  Nasal saline for congestion as needed  Tylenol for fever or pain or headache  Please alert [place] if symptoms worsen (if severe or short of breath please go to the ER)    Once the chest xray is read I will send in an antibiotic for sinuses and also for chest congestion if needed   Also will send low dose prednisone (will make you hyper and hungry) and cough medicine   Follow up here in about 2 weeks for re check and blood pressure

## 2022-10-07 ENCOUNTER — Ambulatory Visit (INDEPENDENT_AMBULATORY_CARE_PROVIDER_SITE_OTHER): Payer: 59 | Admitting: Family Medicine

## 2022-10-07 ENCOUNTER — Encounter: Payer: Self-pay | Admitting: Family Medicine

## 2022-10-07 VITALS — BP 130/65 | HR 68 | Temp 97.6°F | Ht 63.25 in | Wt 176.4 lb

## 2022-10-07 DIAGNOSIS — I700 Atherosclerosis of aorta: Secondary | ICD-10-CM

## 2022-10-07 DIAGNOSIS — J0110 Acute frontal sinusitis, unspecified: Secondary | ICD-10-CM

## 2022-10-07 DIAGNOSIS — U071 COVID-19: Secondary | ICD-10-CM | POA: Diagnosis not present

## 2022-10-07 DIAGNOSIS — E7800 Pure hypercholesterolemia, unspecified: Secondary | ICD-10-CM | POA: Diagnosis not present

## 2022-10-07 NOTE — Patient Instructions (Addendum)
Blood pressure is better   Continue tessalon for cough  Rest when you can Drink fluids   Warm compress may help chest as needed for soreness  Tylenol and or ibuprofen (with food) is ok for chest soreness    Watch for  Fever/ trouble breathing / worse cough or more colored mucous   If you suddenly develop shortness of breath-go to the ER

## 2022-10-07 NOTE — Assessment & Plan Note (Addendum)
Atherosclerosis was noted in thoracic aorta on cxr incidental   Aim to control blood pressure and cholesterol  Blood pressure is improved today   May need statin if cholesterol does not improve with next lab

## 2022-10-07 NOTE — Progress Notes (Signed)
Subjective:    Patient ID: Carolyn Matthews, female    DOB: [birth year], 67 y.o.   MRN: [id number]  HPI  Wt Readings from Last 3 Encounters:  10/07/22 176 lb 6 oz (80 kg)  09/23/22 173 lb 4 oz (78.6 kg)  02/26/22 165 lb (74.8 kg)   31.00 kg/m  Vitals:   10/07/22 1359 10/07/22 1414  BP: 134/68 130/85  Pulse: 68   Temp: 97.6 F (36.4 C)   SpO2: 98%      Pt presents for follow up of covid 19 and for elevated blood pressure and sinusitis   Was seen on 8/20 for initial follow up  Treated for bacterial sinusitis with augmentin and prednisone  Cxr was clear  Tessalon prescription for cough    DG Chest 2 View  Result Date: 09/23/2022 CLINICAL DATA:  67 year old female with history of chest soreness and pleuritic chest pain. Post COVID cough. EXAM: CHEST - 2 VIEW COMPARISON:  Chest x-ray 07/16/2020. FINDINGS: Lung volumes are normal. No consolidative airspace disease. No pleural effusions. No pneumothorax. No pulmonary nodule or mass noted. Pulmonary vasculature and the cardiomediastinal silhouette are within normal limits. Atherosclerosis in the thoracic aorta. IMPRESSION: No active cardiopulmonary disease. Electronically Signed   By: Trudie Reed M.D.   On: 09/23/2022 12:21    Blood pressure was elevated last time   BP Readings from Last 3 Encounters:  10/07/22 130/85  09/23/22 (!) 145/77  02/26/22 131/65   Improved today    Symptoms are improving gradually  Less headache  Not as congested   Cough - improved / some phlegm - clear  No wheezing  No shortness of breath  Sore in chest - worse to take a deep breath or when moving arms a lot    Is tired in general / that is not gone    Cxr noted some thoracic atherosclerosis   Lab Results  Component Value Date   CHOL 217 (H) 02/26/2022   HDL 58.40 02/26/2022   LDLCALC 140 (H) 02/26/2022   TRIG 90.0 02/26/2022   CHOLHDL 4 02/26/2022     Patient Active Problem List   Diagnosis Date Noted   COVID-19  09/23/2022   Aortic atherosclerosis (HCC) 09/23/2022   Estrogen deficiency 02/26/2022   Degenerative cervical disc 02/07/2022   Allergic rhinitis 04/06/2020   OSA (obstructive sleep apnea) 01/20/2020   Lumbar degenerative disc disease 12/08/2016   Lumbar back pain with radiculopathy affecting left lower extremity 12/02/2016   Patellar subluxation, left, initial encounter 06/23/2016   Vitamin D deficiency 10/03/2015   Encounter for vitamin deficiency screening 11/14/2014   Colon cancer screening 08/11/2012   Sinusitis 12/25/2011   Anxiety disorder 02/26/2011   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   Dermatitis 06/24/2010   Routine general medical examination at a health care facility 05/23/2010   MVP (mitral valve prolapse)    Chronic headaches    Seasonal allergies    Family history of breast cancer in female    Prediabetes 04/04/2009   Hyperlipidemia 11/22/2008   Anemia, unspecified 02/12/2007   OVERACTIVE BLADDER 01/11/2007   FIBROCYSTIC BREAST DISEASE 01/11/2007   Past Medical History:  Diagnosis Date   Allergy    seasonal   Anemia    Chronic headaches    Family history of breast cancer in female    Daughter; gene neg per pt   Fibrocystic breast disease    GERD (gastroesophageal reflux disease)    Hyperglycemia    Diet controlled   MVP (  mitral valve prolapse)    Seasonal allergies    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   Past Surgical History:  Procedure Laterality Date   ANKLE FRACTURE SURGERY  05/2003   BREAST BIOPSY  03/2004   Negative   BREAST BIOPSY Right 12/27/2014   fat necrosis / fibrocystic changes   BREAST CYST ASPIRATION  05/2007   Bilateral-benign   BREAST LUMPECTOMY  12/1977   TOTAL ABDOMINAL HYSTERECTOMY  09/1996   Fibroids   Social History   Tobacco Use   Smoking status: Never   Smokeless tobacco: Never  Vaping Use   Vaping status: Never Used  Substance Use Topics   Alcohol use: Yes    Alcohol/week: 0.0 standard drinks of alcohol     Comment: occasional use   Drug use: No   Family History  Problem Relation Age of Onset   Diabetes Father    Hypertension Mother    Breast cancer Daughter        (inflammatory)-now has brain tumor   Diabetes Paternal Grandmother    Breast cancer Other        mat great aunt   Breast cancer Daughter        2nd daughter with breast cancer    Colon cancer Neg Hx    Rectal cancer Neg Hx    Stomach cancer Neg Hx    Esophageal cancer Neg Hx    Allergies  Allergen Reactions   Egg-Derived Products Other (See Comments)    REACTION: SEVERE REACTION PV 4-22 pt states she gets flu like symptoms all but fever, no anaphylasis   Hepatitis A Vaccine Other (See Comments)    ? Allergic reaction; itching,weakness almost immediate after taking 1st vaccine   Current Outpatient Medications on File Prior to Visit  Medication Sig Dispense Refill   benzonatate (TESSALON) 200 MG capsule Take 1 capsule (200 mg total) by mouth 3 (three) times daily as needed for cough. Swallow whole 30 capsule 1   conjugated estrogens (PREMARIN) vaginal cream Place 1 Applicatorful vaginally 2 (two) times a week. 0.5 gram vaginally at bedtime twice weekly 30 g 3   fluocinonide cream (LIDEX) 0.05 % Apply topically 2 (two) times daily. 30 g 0   fluticasone (FLONASE) 50 MCG/ACT nasal spray USE 2 SPRAYS IN EACH NOSTRIL ONCE A DAY 48 g 3   hydrOXYzine (ATARAX/VISTARIL) 25 MG tablet Take 1-2 tablets by mouth every 8 hours as needed for anxiety and panic 30 tablet 3   Multiple Vitamin (MULTIVITAMIN) tablet Take 1 tablet by mouth daily.     sertraline (ZOLOFT) 50 MG tablet Take 1.5 tablets (75 mg total) by mouth daily. 135 tablet 3   No current facility-administered medications on file prior to visit.    Review of Systems  Constitutional:  Positive for fatigue. Negative for activity change, appetite change, fever and unexpected weight change.  HENT:  Positive for rhinorrhea. Negative for congestion, ear pain, sinus pressure and  sore throat.   Eyes:  Negative for pain, redness and visual disturbance.  Respiratory:  Positive for cough. Negative for chest tightness, shortness of breath and wheezing.        Sore in mid chest to take deep breath and with lifting or position change  No wheezing or tightness    Cardiovascular:  Negative for chest pain and palpitations.  Gastrointestinal:  Negative for abdominal pain, blood in stool, constipation and diarrhea.  Endocrine: Negative for polydipsia and polyuria.  Genitourinary:  Negative for dysuria, frequency and urgency.  Musculoskeletal:  Negative for arthralgias, back pain and myalgias.  Skin:  Negative for pallor and rash.  Allergic/Immunologic: Negative for environmental allergies.  Neurological:  Negative for dizziness, syncope and headaches.  Hematological:  Negative for adenopathy. Does not bruise/bleed easily.  Psychiatric/Behavioral:  Negative for decreased concentration and dysphoric mood. The patient is not nervous/anxious.        Objective:   Physical Exam Constitutional:      General: She is not in acute distress.    Appearance: Normal appearance. She is well-developed. She is obese. She is not ill-appearing or diaphoretic.  HENT:     Head: Normocephalic and atraumatic.     Right Ear: Tympanic membrane and ear canal normal.     Left Ear: Tympanic membrane and ear canal normal.     Nose:     Comments: Boggy nares  Clear  Eyes:     General:        Right eye: No discharge.        Left eye: No discharge.     Conjunctiva/sclera: Conjunctivae normal.     Pupils: Pupils are equal, round, and reactive to light.  Neck:     Thyroid: No thyromegaly.     Vascular: No carotid bruit or JVD.  Cardiovascular:     Rate and Rhythm: Normal rate and regular rhythm.     Heart sounds: Normal heart sounds.     No gallop.  Pulmonary:     Effort: Pulmonary effort is normal. No respiratory distress.     Breath sounds: Normal breath sounds. No stridor. No wheezing,  rhonchi or rales.     Comments: Tender over mid chest  No crepitus or step off or skin change  Able to take deep breath   Cough sounds dry  Chest:     Chest wall: Tenderness present.  Abdominal:     General: There is no distension or abdominal bruit.     Palpations: Abdomen is soft.  Musculoskeletal:     Cervical back: Normal range of motion and neck supple.     Right lower leg: No edema.     Left lower leg: No edema.  Lymphadenopathy:     Cervical: No cervical adenopathy.  Skin:    General: Skin is warm and dry.     Coloration: Skin is not pale.     Findings: No rash.  Neurological:     Mental Status: She is alert.     Coordination: Coordination normal.     Deep Tendon Reflexes: Reflexes are normal and symmetric. Reflexes normal.  Psychiatric:        Mood and Affect: Mood normal.           Assessment & Plan:   Problem List Items Addressed This Visit       Cardiovascular and Mediastinum   Aortic atherosclerosis (HCC)    Atherosclerosis was noted in thoracic aorta on cxr incidental   Aim to control blood pressure and cholesterol  Blood pressure is improved today   May need statin if cholesterol does not improve with next lab         Respiratory   Sinusitis    Much improved with prenisone and augmentin         Other   COVID-19 - Primary    Continues to improve s/p prednisone and augmentin for sinusitis  Blood pressure is better  Normal pulse ox and pulse  Still some cough (clear phlegm) w/o wheezing but some chest soreness  Exam  is very reassuring  Normal pulse ox   Will continue tessalon prn  Also recommend analgesic and warm compress to chest if able Update if not starting to improve in a week or if worsening  Call back and Er precautions noted in detail today    Rev signs and symptoms of PE to watch for (shortness of breath/ pain) in detail today       Hyperlipidemia    Will need to watch closely in light of aortic atherosclerosis seen on her  cxr

## 2022-10-07 NOTE — Assessment & Plan Note (Signed)
Will need to watch closely in light of aortic atherosclerosis seen on her cxr

## 2022-10-07 NOTE — Assessment & Plan Note (Signed)
Much improved with prenisone and augmentin

## 2022-10-07 NOTE — Assessment & Plan Note (Addendum)
Continues to improve s/p prednisone and augmentin for sinusitis  Blood pressure is better  Normal pulse ox and pulse  Still some cough (clear phlegm) w/o wheezing but some chest soreness  Exam is very reassuring  Normal pulse ox   Will continue tessalon prn  Also recommend analgesic and warm compress to chest if able Update if not starting to improve in a week or if worsening  Call back and Er precautions noted in detail today    Rev signs and symptoms of PE to watch for (shortness of breath/ pain) in detail today

## 2022-11-03 ENCOUNTER — Other Ambulatory Visit (HOSPITAL_COMMUNITY): Payer: Self-pay

## 2022-11-03 ENCOUNTER — Ambulatory Visit (INDEPENDENT_AMBULATORY_CARE_PROVIDER_SITE_OTHER): Payer: 59 | Admitting: Family Medicine

## 2022-11-03 ENCOUNTER — Encounter: Payer: Self-pay | Admitting: Family Medicine

## 2022-11-03 VITALS — BP 135/70 | HR 73 | Temp 98.6°F | Ht 63.25 in | Wt 172.1 lb

## 2022-11-03 DIAGNOSIS — G8929 Other chronic pain: Secondary | ICD-10-CM | POA: Diagnosis not present

## 2022-11-03 DIAGNOSIS — R519 Headache, unspecified: Secondary | ICD-10-CM | POA: Insufficient documentation

## 2022-11-03 NOTE — Assessment & Plan Note (Signed)
This is different from past headaches  Intermittent for 1 week (lasting 30 minutes at a time) throbbing at times with some light sensitivity  Reassuring exam , noted some tenderness over left frontal sinus and also temple  Blood pressure is higher than usual but better on 2nd check  ESR and crp ordered to r/o temporal arteritis  Pending results  May consider prednisone course  May consider imaging  Update if not starting to improve in a week or if worsening  Call back and Er precautions noted in detail today   Addendum Labs reassuring  Will try to get MRI based on age and severity (if ins does not pay will do CT) Her headache did proceed to other side of head  Will try prednisone course also  Call back and Er precautions noted in detail today

## 2022-11-03 NOTE — Patient Instructions (Signed)
Stay hydrated  Don't drink caffeine every day  Try and keep bed and wake times the same   Lab today  Will make plan when we get a result   Motrin is ok- always take with food   Try a cold compress on the area that hurts 4

## 2022-11-03 NOTE — Progress Notes (Signed)
Subjective:    Patient ID: Carolyn Matthews, female    DOB: [birth year], 67 y.o.   MRN: [id number]  HPI  Wt Readings from Last 3 Encounters:  11/03/22 172 lb 2 oz (78.1 kg)  10/07/22 176 lb 6 oz (80 kg)  09/23/22 173 lb 4 oz (78.6 kg)   30.25 kg/m  Vitals:   11/03/22 1359 11/03/22 1420  BP: (!) 148/64 135/70  Pulse: 73   Temp: 98.6 F (37 C)   SpO2: 97%      Pt presents for c/o head pain   Headache - left side /over eye and and rad behind eye and back of neck  When it starts-throbbing, then turns into a dull constant pain   Started about a week ago (comes and goes , usually lasts 30 minutes)  Not similar to other headaches  When it happens is intense -up to 8/10 pain scale   No other neuro symptoms  No vision change    No n/v  Some sensitivity to light  Not sound    No head trauma  Stress is about the same   Lately trying to drink more fluids   Caffeine - 2 sodas per week max     Past history of headaches/ not migraine   Some snoring (husband notes)  Some unrestful sleep  Has OSA  Does not use cpap machine   Over the counter Took motrin one time , it did help  (800 mg)    Patient Active Problem List   Diagnosis Date Noted   Left-sided headache 11/03/2022   COVID-19 09/23/2022   Aortic atherosclerosis (HCC) 09/23/2022   Estrogen deficiency 02/26/2022   Degenerative cervical disc 02/07/2022   Allergic rhinitis 04/06/2020   OSA (obstructive sleep apnea) 01/20/2020   Lumbar degenerative disc disease 12/08/2016   Lumbar back pain with radiculopathy affecting left lower extremity 12/02/2016   Patellar subluxation, left, initial encounter 06/23/2016   Vitamin D deficiency 10/03/2015   Encounter for vitamin deficiency screening 11/14/2014   Colon cancer screening 08/11/2012   Sinusitis 12/25/2011   Anxiety disorder 02/26/2011   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   Dermatitis 06/24/2010   Routine general medical examination at a health  care facility 05/23/2010   MVP (mitral valve prolapse)    Chronic headaches    Seasonal allergies    Family history of breast cancer in female    Prediabetes 04/04/2009   Hyperlipidemia 11/22/2008   Anemia, unspecified 02/12/2007   OVERACTIVE BLADDER 01/11/2007   FIBROCYSTIC BREAST DISEASE 01/11/2007   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   Past Surgical History:  Procedure Laterality Date   ANKLE FRACTURE SURGERY  05/2003   BREAST BIOPSY  03/2004   Negative   BREAST BIOPSY Right 12/27/2014   fat necrosis / fibrocystic changes   BREAST CYST ASPIRATION  05/2007   Bilateral-benign   BREAST LUMPECTOMY  12/1977   TOTAL ABDOMINAL HYSTERECTOMY  09/1996   Fibroids   Social History   Tobacco Use   Smoking status: Never   Smokeless tobacco: Never  Vaping  Use   Vaping status: Never Used  Substance Use Topics   Alcohol use: Yes    Alcohol/week: 0.0 standard drinks of alcohol    Comment: occasional use   Drug use: No   Family History  Problem Relation Age of Onset   Diabetes Father    Hypertension Mother    Breast cancer Daughter        (inflammatory)-now has brain tumor   Diabetes Paternal Grandmother    Breast cancer Other        mat great aunt   Breast cancer Daughter        2nd daughter with breast cancer    Colon cancer Neg Hx    Rectal cancer Neg Hx    Stomach cancer Neg Hx    Esophageal cancer Neg Hx    Allergies  Allergen Reactions   Egg-Derived Products Other (See Comments)    REACTION: SEVERE REACTION PV 4-22 pt states she gets flu like symptoms all but fever, no anaphylasis   Hepatitis A Vaccine Other (See Comments)    ?  Allergic reaction; itching,weakness almost immediate after taking 1st vaccine   Current Outpatient Medications on File Prior to Visit  Medication Sig Dispense Refill   benzonatate (TESSALON) 200 MG capsule Take 1 capsule (200 mg total) by mouth 3 (three) times daily as needed for cough. Swallow whole 30 capsule 1   conjugated estrogens (PREMARIN) vaginal cream Place 1 Applicatorful vaginally 2 (two) times a week. 0.5 gram vaginally at bedtime twice weekly 30 g 3   fluocinonide cream (LIDEX) 0.05 % Apply topically 2 (two) times daily. 30 g 0   fluticasone (FLONASE) 50 MCG/ACT nasal spray USE 2 SPRAYS IN EACH NOSTRIL ONCE A DAY 48 g 3   hydrOXYzine (ATARAX/VISTARIL) 25 MG tablet Take 1-2 tablets by mouth every 8 hours as needed for anxiety and panic 30 tablet 3   Multiple Vitamin (MULTIVITAMIN) tablet Take 1 tablet by mouth daily.     sertraline (ZOLOFT) 50 MG tablet Take 1.5 tablets (75 mg total) by mouth daily. 135 tablet 3   No current facility-administered medications on file prior to visit.    Review of Systems  Constitutional:  Negative for activity change, appetite change, fatigue, fever and unexpected weight change.  HENT:  Negative for congestion, ear pain, rhinorrhea, sinus pressure and sore throat.   Eyes:  Negative for pain, discharge, redness, itching and visual disturbance.  Respiratory:  Negative for cough, shortness of breath and wheezing.   Cardiovascular:  Negative for chest pain and palpitations.  Gastrointestinal:  Negative for abdominal pain, blood in stool, constipation and diarrhea.  Endocrine: Negative for polydipsia and polyuria.  Genitourinary:  Negative for dysuria, frequency and urgency.  Musculoskeletal:  Negative for arthralgias, back pain and myalgias.  Skin:  Negative for pallor and rash.  Allergic/Immunologic: Negative for environmental allergies.  Neurological:  Positive for headaches. Negative for dizziness, tremors, seizures, syncope, facial asymmetry,  speech difficulty, weakness, light-headedness and numbness.  Hematological:  Negative for adenopathy. Does not bruise/bleed easily.  Psychiatric/Behavioral:  Negative for decreased concentration and dysphoric mood. The patient is not nervous/anxious.        Objective:   Physical Exam Constitutional:      General: She is not in acute distress.    Appearance: Normal appearance. She is well-developed. She is obese. She is not ill-appearing or diaphoretic.  HENT:     Head: Normocephalic and atraumatic.     Comments: Mild left temporal tendenress Mild left frontal  sinus tenderness     Right Ear: Tympanic membrane, ear canal and external ear normal.     Left Ear: Tympanic membrane and external ear normal.     Nose: Nose normal. No congestion or rhinorrhea.     Mouth/Throat:     Mouth: Mucous membranes are moist.     Pharynx: No oropharyngeal exudate or posterior oropharyngeal erythema.  Eyes:     General: No scleral icterus.       Right eye: No discharge.        Left eye: No discharge.     Conjunctiva/sclera: Conjunctivae normal.     Pupils: Pupils are equal, round, and reactive to light.     Comments: No nystagmus  Neck:     Thyroid: No thyromegaly.     Vascular: No carotid bruit or JVD.     Trachea: No tracheal deviation.  Cardiovascular:     Rate and Rhythm: Normal rate and regular rhythm.     Heart sounds: Normal heart sounds. No murmur heard. Pulmonary:     Effort: Pulmonary effort is normal. No respiratory distress.     Breath sounds: Normal breath sounds. No wheezing or rales.  Abdominal:     General: Bowel sounds are normal. There is no distension.     Palpations: Abdomen is soft. There is no mass.     Tenderness: There is no abdominal tenderness.  Musculoskeletal:        General: No tenderness.     Cervical back: Full passive range of motion without pain, normal range of motion and neck supple.  Lymphadenopathy:     Cervical: No cervical adenopathy.  Skin:     General: Skin is warm and dry.     Coloration: Skin is not pale.     Findings: No rash.  Neurological:     Mental Status: She is alert and oriented to person, place, and time.     Cranial Nerves: No cranial nerve deficit, dysarthria or facial asymmetry.     Sensory: Sensation is intact. No sensory deficit.     Motor: No weakness, tremor, atrophy, abnormal muscle tone, seizure activity or pronator drift.     Coordination: Romberg sign negative. Coordination normal. Finger-Nose-Finger Test normal.     Gait: Gait normal.     Deep Tendon Reflexes: Reflexes are normal and symmetric. Reflexes normal.     Comments: No focal cerebellar signs   Psychiatric:        Mood and Affect: Mood normal.        Behavior: Behavior normal.        Thought Content: Thought content normal.           Assessment & Plan:   Problem List Items Addressed This Visit       Other   Left-sided headache - Primary    This is different from past headaches  Intermittent for 1 week (lasting 30 minutes at a time) throbbing at times with some light sensitivity  Reassuring exam , noted some tenderness over left frontal sinus and also temple  Blood pressure is higher than usual but better on 2nd check  ESR and crp ordered to r/o temporal arteritis  Pending results  May consider prednisone course  May consider imaging  Update if not starting to improve in a week or if worsening  Call back and Er precautions noted in detail today         Relevant Orders   Sedimentation Rate   C-reactive protein  CBC with Differential/Platelet

## 2022-11-04 ENCOUNTER — Other Ambulatory Visit (HOSPITAL_COMMUNITY): Payer: Self-pay

## 2022-11-04 LAB — CBC WITH DIFFERENTIAL/PLATELET
Basophils Absolute: 0.1 10*3/uL (ref 0.0–0.1)
Basophils Relative: 0.8 % (ref 0.0–3.0)
Eosinophils Absolute: 0.2 10*3/uL (ref 0.0–0.7)
Eosinophils Relative: 2.6 % (ref 0.0–5.0)
HCT: 35.1 % — ABNORMAL LOW (ref 36.0–46.0)
Hemoglobin: 11.4 g/dL — ABNORMAL LOW (ref 12.0–15.0)
Lymphocytes Relative: 23.8 % (ref 12.0–46.0)
Lymphs Abs: 1.8 10*3/uL (ref 0.7–4.0)
MCHC: 32.6 g/dL (ref 30.0–36.0)
MCV: 91.1 fL (ref 78.0–100.0)
Monocytes Absolute: 0.5 10*3/uL (ref 0.1–1.0)
Monocytes Relative: 5.9 % (ref 3.0–12.0)
Neutro Abs: 5.2 10*3/uL (ref 1.4–7.7)
Neutrophils Relative %: 66.9 % (ref 43.0–77.0)
Platelets: 329 10*3/uL (ref 150.0–400.0)
RBC: 3.86 Mil/uL — ABNORMAL LOW (ref 3.87–5.11)
RDW: 14 % (ref 11.5–15.5)
WBC: 7.7 10*3/uL (ref 4.0–10.5)

## 2022-11-04 LAB — C-REACTIVE PROTEIN: CRP: <1 mg/dL (ref 0.5–20.0)

## 2022-11-04 LAB — SEDIMENTATION RATE: Sed Rate: 39 mm/h — ABNORMAL HIGH (ref 0–30)

## 2022-11-06 MED ORDER — PREDNISONE 10 MG PO TABS: ORAL_TABLET | ORAL | 0 refills | Status: DC

## 2022-11-06 NOTE — Addendum Note (Signed)
Addended by: Roxy Manns A on: 11/06/2022 02:03 PM   Modules accepted: Orders

## 2022-11-11 ENCOUNTER — Encounter: Payer: Self-pay | Admitting: Family Medicine

## 2022-11-12 ENCOUNTER — Ambulatory Visit: Admission: RE | Admit: 2022-11-12 | Payer: 59 | Source: Ambulatory Visit

## 2022-12-17 ENCOUNTER — Encounter: Payer: Self-pay | Admitting: Family Medicine

## 2022-12-17 ENCOUNTER — Telehealth: Payer: Self-pay | Admitting: *Deleted

## 2022-12-17 ENCOUNTER — Telehealth (INDEPENDENT_AMBULATORY_CARE_PROVIDER_SITE_OTHER): Payer: 59 | Admitting: Family Medicine

## 2022-12-17 DIAGNOSIS — M5416 Radiculopathy, lumbar region: Secondary | ICD-10-CM | POA: Diagnosis not present

## 2022-12-17 MED ORDER — PREDNISONE 10 MG PO TABS: ORAL_TABLET | ORAL | 0 refills | Status: DC

## 2022-12-17 MED ORDER — GABAPENTIN 300 MG PO CAPS: 300.0000 mg | ORAL_CAPSULE | Freq: Two times a day (BID) | ORAL | 1 refills | Status: DC

## 2022-12-17 NOTE — Patient Instructions (Signed)
Take prednisone taper as directed  Try gabapentin 300 mg twice daily -caution of sedation or loss of balance   If worse or if you develop numbness of saddle area, difficulty lifting left toes or any sudden loss of bowel or bladder control- go to the ER  The office willl call you to set up in office visit next week for re check   In meantime keep [place] posted

## 2022-12-17 NOTE — Telephone Encounter (Signed)
Pt had a virtual visit with PCP today. Per Dr. Milinda Antis pt needs an "IN OFFICE" appt with her next week. Please schedule, thanks

## 2022-12-17 NOTE — Assessment & Plan Note (Signed)
Worsened symptoms since Sunday- woke up with left lumbar pain rad to LLE to knee  No worrisome/red flag neuro symptoms  Pain worse with flex/ext and left lat bend and walking  Reviewed old records from ortho Yevette Edwards) and sport med Katrinka Blazing) Reviewed last MRI and LS films  Has history of DDD/ spondylosis, and arthritis  Most recently S1 nerve impingement   Sent gabapentin 300 mg bid for pain (discussed side effects/she has tried higher dose) Also 40 mg pred taper Follow up next week or earlier if needed  (in person)  May need PT in future and /or re consid of injections   Symptom care-see AVS Update if not starting to improve in a week or if worsening  Call back and Er precautions noted in detail today

## 2022-12-17 NOTE — Progress Notes (Signed)
Virtual Visit via Video Note  I connected with Carolyn Matthews on 12/17/22 at 12:30 PM EST by a video enabled telemedicine application and verified that I am speaking with the correct person using two identifiers.  Location: Patient: home Provider: office    I discussed the limitations of evaluation and management by telemedicine and the availability of in person appointments. The patient expressed understanding and agreed to proceed.  Parties involved in encounter  Patient: Carolyn Matthews   Provider:  Roxy Manns MD   History of Present Illness: Pt presents for a sciatica flare up   Sunday she woke up with lower back pain / left side  Getting worse and worse  Last night- worst night  Daughter gave her some gabapentin 600 mg  Took some today as well   Motrin 800 mg also   Now radiating down left leg to the knee  No numbness  No foot drop or weakness   No known triggers   Hurts to lean on left side and walking on left foot  Bending forward and backwards hurts   No topical medicines    Saw sport med Dr Katrinka Blazing a year ago for exacerbation of lumbar pain with radiculopathy on left  Gave her home exercises  Meloxicam was prescription    Last MRI LS was 2018  IMPRESSION: 1. The dominant finding is a left lateral recess disc protrusion extending caudad and causing moderate impingement on the left S1 nerve roots. This has mixed signal intensity on T2 weighted images, potentially indicating a subacute chronicity disc protrusion. 2. There is also borderline impingement at L3-4 and L4-5 as detailed above due to spondylosis and degenerative disc disease.  Saw Dr Yevette Edwards about this in 2028  Recommended PT - it helped   Injections - never ended up needed   Last LS xray 2022 Study Result  Narrative & Impression  CLINICAL DATA:  Back pain   EXAM: LUMBAR SPINE - COMPLETE 4+ VIEW   COMPARISON:  12/19/2016, 12/02/2016   FINDINGS: Five non rib-bearing lumbar type  vertebra. Lumbar alignment within normal limits. Mild degenerative changes at L3-L4 and L5-S1. Remaining disc spaces are patent. Mild facet degenerative changes of the lower lumbar spine.   IMPRESSION: Mild degenerative changes.  No acute osseous abnormality.     Review of Systems  Constitutional:  Negative for chills, fever and malaise/fatigue.  HENT:  Negative for congestion, ear pain, sinus pain and sore throat.   Eyes:  Negative for blurred vision, discharge and redness.  Respiratory:  Negative for cough, shortness of breath and stridor.   Cardiovascular:  Negative for chest pain, palpitations and leg swelling.  Gastrointestinal:  Negative for abdominal pain, diarrhea, nausea and vomiting.  Musculoskeletal:  Positive for back pain. Negative for myalgias.  Skin:  Negative for rash.  Neurological:  Negative for dizziness, tingling, sensory change, focal weakness, weakness and headaches.      Observations/Objective:  Patient appears well, in no distress Weight is baseline  No facial swelling or asymmetry Normal voice-not hoarse and no slurred speech No obvious tremor or mobility impairment Moving neck and UEs normally Notes pain with spinal flex and ext , also with left lat bend  Able to hear the call well  No cough or shortness of breath during interview  Talkative and mentally sharp with no cognitive changes No skin changes on face or neck , no rash or pallor Affect is normal   Assessment and Plan:  Problem List Items Addressed This Visit  Nervous and Auditory   Lumbar back pain with radiculopathy affecting left lower extremity - Primary    Worsened symptoms since Sunday- woke up with left lumbar pain rad to LLE to knee  No worrisome/red flag neuro symptoms  Pain worse with flex/ext and left lat bend and walking  Reviewed old records from ortho Yevette Edwards) and sport med Katrinka Blazing) Reviewed last MRI and LS films  Has history of DDD/ spondylosis, and arthritis   Most recently S1 nerve impingement   Sent gabapentin 300 mg bid for pain (discussed side effects/she has tried higher dose) Also 40 mg pred taper Follow up next week or earlier if needed  (in person)  May need PT in future and /or re consid of injections   Symptom care-see AVS Update if not starting to improve in a week or if worsening  Call back and Er precautions noted in detail today          Relevant Medications   predniSONE (DELTASONE) 10 MG tablet   gabapentin (NEURONTIN) 300 MG capsule    Follow Up Instructions: Take prednisone taper as directed  Try gabapentin 300 mg twice daily -caution of sedation or loss of balance   If worse or if you develop numbness of saddle area, difficulty lifting left toes or any sudden loss of bowel or bladder control- go to the ER  The office willl call you to set up in office visit next week for re check   In meantime keep [place] posted    I discussed the assessment and treatment plan with the patient. The patient was provided an opportunity to ask questions and all were answered. The patient agreed with the plan and demonstrated an understanding of the instructions.   The patient was advised to call back or seek an in-person evaluation if the symptoms worsen or if the condition fails to improve as anticipated.     Roxy Manns, MD

## 2022-12-19 ENCOUNTER — Encounter: Payer: Self-pay | Admitting: Family Medicine

## 2022-12-26 ENCOUNTER — Encounter: Payer: Self-pay | Admitting: Family Medicine

## 2022-12-26 ENCOUNTER — Ambulatory Visit (INDEPENDENT_AMBULATORY_CARE_PROVIDER_SITE_OTHER)
Admission: RE | Admit: 2022-12-26 | Discharge: 2022-12-26 | Disposition: A | Discharge status: Home / Self Care | Payer: 59 | Source: Ambulatory Visit | Attending: Family Medicine | Admitting: Family Medicine

## 2022-12-26 ENCOUNTER — Ambulatory Visit (INDEPENDENT_AMBULATORY_CARE_PROVIDER_SITE_OTHER): Payer: 59 | Admitting: Family Medicine

## 2022-12-26 VITALS — BP 132/68 | HR 83 | Temp 98.9°F | Ht 63.25 in | Wt 177.2 lb

## 2022-12-26 DIAGNOSIS — M5137 Other intervertebral disc degeneration, lumbosacral region with discogenic back pain only: Secondary | ICD-10-CM | POA: Diagnosis not present

## 2022-12-26 DIAGNOSIS — M51362 Other intervertebral disc degeneration, lumbar region with discogenic back pain and lower extremity pain: Secondary | ICD-10-CM

## 2022-12-26 DIAGNOSIS — M5136 Other intervertebral disc degeneration, lumbar region with discogenic back pain only: Secondary | ICD-10-CM | POA: Diagnosis not present

## 2022-12-26 DIAGNOSIS — M5416 Radiculopathy, lumbar region: Secondary | ICD-10-CM | POA: Diagnosis not present

## 2022-12-26 DIAGNOSIS — M438X6 Other specified deforming dorsopathies, lumbar region: Secondary | ICD-10-CM | POA: Diagnosis not present

## 2022-12-26 DIAGNOSIS — M4316 Spondylolisthesis, lumbar region: Secondary | ICD-10-CM | POA: Diagnosis not present

## 2022-12-26 NOTE — Progress Notes (Unsigned)
Subjective:    Patient ID: Carolyn Matthews, female    DOB: [birth year], 67 y.o.   MRN: [id number]  HPI  Wt Readings from Last 3 Encounters:  12/26/22 177 lb 4 oz (80.4 kg)  11/03/22 172 lb 2 oz (78.1 kg)  10/07/22 176 lb 6 oz (80 kg)   31.15 kg/m  Vitals:   12/26/22 1420 12/26/22 1450  BP: (!) 144/62 132/68  Pulse: 83   Temp: 98.9 F (37.2 C)   SpO2: 95%    Pt presents for follow up of back pain and radicular symptoms    Last visit was virtual  We treatment with predisone taper and 300 mg bid gabapentin  Pt messaged in 11/15  noting pain was still there and left foot was hurting  Taking 800 mg of motrin  Noted history of DDD /spondylosis and arthritis  S1 nerve impingment   Today a little better Pain in left posterior thigh  Rad to foot  Back pain is improved   Worse with walking and standing (more than 5 minute)   Foot is numb or tinging No weakness   DG Lumbar Spine 2-3 Views  Result Date: 12/26/2022 CLINICAL DATA:  Lumbar pain radiating to left leg. Paresthesia of foot. Suspected radiculopathy. EXAM: LUMBAR SPINE - 2-3 VIEW COMPARISON:  Lumbar radiograph 07/16/2020 FINDINGS: Five non-rib-bearing lumbar vertebra. Broad-based levo scoliotic curvature which is increased from prior exam, 10 degrees when measured from superior endplate of T12 to inferior endplate of L4. Similar trace anterolisthesis of L3 on L4. Progression in L3-L4 disc space narrowing and spurring. Similar L5-S1 disc space narrowing. Normal vertebral body heights, no evidence of fracture or compression deformity. L3-L4 facet hypertrophy. No visible pars defects. IMPRESSION: 1. Progression in degenerative disc disease at L3-L4 since 2022 with unchanged trace anterolisthesis. 2. Similar L5-S1 degenerative disc disease. 3. Increased levo scoliotic curvature of the lumbar spine. Electronically Signed   By: Narda Rutherford M.D.   On: 12/26/2022 15:40      Patient Active Problem List   Diagnosis Date  Noted   Left-sided headache 11/03/2022   COVID-19 09/23/2022   Aortic atherosclerosis (HCC) 09/23/2022   Estrogen deficiency 02/26/2022   Degenerative cervical disc 02/07/2022   Allergic rhinitis 04/06/2020   OSA (obstructive sleep apnea) 01/20/2020   Lumbar degenerative disc disease 12/08/2016   Lumbar back pain with radiculopathy affecting left lower extremity 12/02/2016   Patellar subluxation, left, initial encounter 06/23/2016   Vitamin D deficiency 10/03/2015   Encounter for vitamin deficiency screening 11/14/2014   Colon cancer screening 08/11/2012   Sinusitis 12/25/2011   Anxiety disorder 02/26/2011   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   Dermatitis 06/24/2010   Routine general medical examination at a health care facility 05/23/2010   MVP (mitral valve prolapse)    Chronic headaches    Seasonal allergies    Family history of breast cancer in female    Prediabetes 04/04/2009   Hyperlipidemia 11/22/2008   Anemia, unspecified 02/12/2007   OVERACTIVE BLADDER 01/11/2007   FIBROCYSTIC BREAST DISEASE 01/11/2007   Past Medical History:  Diagnosis Date   Allergy    seasonal   Anemia    Chronic headaches    Family history of breast cancer in female    Daughter; gene neg per pt   Fibrocystic breast disease    GERD (gastroesophageal reflux disease)    Hyperglycemia    Diet controlled   MVP (mitral valve prolapse)    Seasonal allergies    Traumatic arthritis  of ankle 11/14/2010   Past Surgical History:  Procedure Laterality Date   ANKLE FRACTURE SURGERY  05/2003   BREAST BIOPSY  03/2004   Negative   BREAST BIOPSY Right 12/27/2014   fat necrosis / fibrocystic changes   BREAST CYST ASPIRATION  05/2007   Bilateral-benign   BREAST LUMPECTOMY  12/1977   TOTAL ABDOMINAL HYSTERECTOMY  09/1996   Fibroids   Social History   Tobacco Use   Smoking status: Never   Smokeless tobacco: Never  Vaping Use   Vaping status: Never Used  Substance Use Topics   Alcohol use: Yes     Alcohol/week: 0.0 standard drinks of alcohol    Comment: occasional use   Drug use: No   Family History  Problem Relation Age of Onset   Diabetes Father    Hypertension Mother    Breast cancer Daughter        (inflammatory)-now has brain tumor   Diabetes Paternal Grandmother    Breast cancer Other        mat great aunt   Breast cancer Daughter        2nd daughter with breast cancer    Colon cancer Neg Hx    Rectal cancer Neg Hx    Stomach cancer Neg Hx    Esophageal cancer Neg Hx    Allergies  Allergen Reactions   Egg-Derived Products Other (See Comments)    REACTION: SEVERE REACTION PV 4-22 pt states she gets flu like symptoms all but fever, no anaphylasis   Hepatitis A Vaccine Other (See Comments)    ? Allergic reaction; itching,weakness almost immediate after taking 1st vaccine   Current Outpatient Medications on File Prior to Visit  Medication Sig Dispense Refill   conjugated estrogens (PREMARIN) vaginal cream Place 1 Applicatorful vaginally 2 (two) times a week. 0.5 gram vaginally at bedtime twice weekly 30 g 3   fluocinonide cream (LIDEX) 0.05 % Apply topically 2 (two) times daily. 30 g 0   fluticasone (FLONASE) 50 MCG/ACT nasal spray USE 2 SPRAYS IN EACH NOSTRIL ONCE A DAY 48 g 3   gabapentin (NEURONTIN) 300 MG capsule Take 1 capsule (300 mg total) by mouth 2 (two) times daily. 60 capsule 1   hydrOXYzine (ATARAX/VISTARIL) 25 MG tablet Take 1-2 tablets by mouth every 8 hours as needed for anxiety and panic 30 tablet 3   Multiple Vitamin (MULTIVITAMIN) tablet Take 1 tablet by mouth daily.     predniSONE (DELTASONE) 10 MG tablet Take 4 pills once daily by mouth for 3 days, then 3 pills daily for 3 days, then 2 pills daily for 3 days then 1 pill daily for 3 days then stop 30 tablet 0   sertraline (ZOLOFT) 50 MG tablet Take 1.5 tablets (75 mg total) by mouth daily. 135 tablet 3   No current facility-administered medications on file prior to visit.    Review of Systems   Constitutional:  Negative for activity change, appetite change, fatigue, fever and unexpected weight change.  HENT:  Negative for congestion, ear pain, rhinorrhea, sinus pressure and sore throat.   Eyes:  Negative for pain, redness and visual disturbance.  Respiratory:  Negative for cough, shortness of breath and wheezing.   Cardiovascular:  Negative for chest pain and palpitations.  Gastrointestinal:  Negative for abdominal pain, blood in stool, constipation and diarrhea.  Endocrine: Negative for polydipsia and polyuria.  Genitourinary:  Negative for dysuria, frequency and urgency.  Musculoskeletal:  Positive for back pain. Negative for arthralgias and myalgias.  Skin:  Negative for pallor and rash.  Allergic/Immunologic: Negative for environmental allergies.  Neurological:  Positive for numbness. Negative for dizziness, syncope and headaches.       Tingling and pain in LLE down to foot   Hematological:  Negative for adenopathy. Does not bruise/bleed easily.  Psychiatric/Behavioral:  Negative for decreased concentration and dysphoric mood. The patient is not nervous/anxious.        Objective:   Physical Exam Constitutional:      General: She is not in acute distress.    Appearance: Normal appearance. She is well-developed. She is obese. She is not ill-appearing.  HENT:     Head: Normocephalic and atraumatic.  Eyes:     Conjunctiva/sclera: Conjunctivae normal.     Pupils: Pupils are equal, round, and reactive to light.  Neck:     Thyroid: No thyromegaly.     Vascular: No carotid bruit or JVD.  Cardiovascular:     Rate and Rhythm: Normal rate and regular rhythm.     Heart sounds: Normal heart sounds.     No gallop.  Pulmonary:     Effort: Pulmonary effort is normal. No respiratory distress.     Breath sounds: Normal breath sounds. No wheezing or rales.  Abdominal:     General: There is no distension or abdominal bruit.     Palpations: Abdomen is soft.  Musculoskeletal:      Cervical back: Normal range of motion and neck supple.     Lumbar back: Spasms and tenderness present. No swelling, edema or deformity. Decreased range of motion. Positive left straight leg raise test. Negative right straight leg raise test. Scoliosis present.     Right lower leg: No edema.     Left lower leg: No edema.     Comments: Some loss of lordosis  Mild scoliosis  Mild lower LS tenderness / left lumbar muscular tenderness Worse with extension  Normal gait  Positive SLR on left for leg pain  Normal rom of hip  Lymphadenopathy:     Cervical: No cervical adenopathy.  Skin:    General: Skin is warm and dry.     Coloration: Skin is not pale.     Findings: No bruising or rash.  Neurological:     Mental Status: She is alert.     Motor: No weakness.     Coordination: Coordination normal.     Deep Tendon Reflexes: Reflexes are normal and symmetric. Reflexes normal.     Comments: Normal strength of LLE  No foot drop    Psychiatric:        Mood and Affect: Mood normal.           Assessment & Plan:   Problem List Items Addressed This Visit       Nervous and Auditory   Lumbar back pain with radiculopathy affecting left lower extremity - Primary    Now with gabapentin and after prednisone back pain has improved but continues to have worsening radicular symptoms in LLE   History of DDD and nerve impingement in past   Xray ordered Noted progression in DDD at L3-4 and L5-S1  Trace anterolisthesis  Scoliosis  Call back and Er precautions noted in detail today    Would be interested in getting MRI   Rehab handout given for stretching Encouraged heat  TENS unit is ok if tolerated         Relevant Orders   DG Lumbar Spine 2-3 Views (Completed)

## 2022-12-26 NOTE — Patient Instructions (Addendum)
TENS unit is unit is fine  Stretching is fine  Xray now   If symptoms suddenly worsen - let us know If severe you develop foot drop or if you suddenly loose control of bowel or bladder control go to the ER   Heat is ok for 10  minutes at a time   We will make a plan when xray report returns

## 2022-12-28 ENCOUNTER — Encounter: Payer: Self-pay | Admitting: Family Medicine

## 2022-12-28 NOTE — Assessment & Plan Note (Addendum)
Now with gabapentin and after prednisone back pain has improved but continues to have worsening radicular symptoms in LLE   History of DDD and nerve impingement in past   Xray ordered Noted progression in DDD at L3-4 and L5-S1  Trace anterolisthesis  Scoliosis  Call back and Er precautions noted in detail today    Would be interested in getting MRI   Rehab handout given for stretching Encouraged heat  TENS unit is ok if tolerated    Imaging notes degenerative changes Given symptoms off/on over a year, worse in past month and failed conservative treatment will see if we can get MRI LS

## 2022-12-29 NOTE — Addendum Note (Signed)
Addended by: Roxy Manns A on: 12/29/2022 08:12 PM   Modules accepted: Orders

## 2023-01-17 ENCOUNTER — Ambulatory Visit
Admission: RE | Admit: 2023-01-17 | Discharge: 2023-01-17 | Disposition: A | Discharge status: Home / Self Care | Payer: 59 | Source: Ambulatory Visit | Attending: Family Medicine | Admitting: Family Medicine

## 2023-01-17 DIAGNOSIS — M5416 Radiculopathy, lumbar region: Secondary | ICD-10-CM

## 2023-01-17 DIAGNOSIS — M5127 Other intervertebral disc displacement, lumbosacral region: Secondary | ICD-10-CM | POA: Diagnosis not present

## 2023-01-29 ENCOUNTER — Other Ambulatory Visit: Payer: Self-pay | Admitting: Family Medicine

## 2023-01-29 DIAGNOSIS — Z1231 Encounter for screening mammogram for malignant neoplasm of breast: Secondary | ICD-10-CM

## 2023-01-29 DIAGNOSIS — E2839 Other primary ovarian failure: Secondary | ICD-10-CM

## 2023-02-10 ENCOUNTER — Other Ambulatory Visit: Payer: Self-pay

## 2023-02-10 ENCOUNTER — Other Ambulatory Visit (HOSPITAL_COMMUNITY): Payer: Self-pay

## 2023-02-20 NOTE — Progress Notes (Unsigned)
Referring Physician:  Tower, Carolyn Gallus, MD [address] [place],  [place] [address]  Primary Physician:  Matthews, Carolyn Gallus, MD  History of Present Illness: 02/23/2023 Carolyn Matthews has a history of MVP, OSA, hyperlipidemia, prediabetes.  She has constant LBP with constant posterior left leg pain to her foot x several months. She has seen some relief with prednisone. No right leg pain. She has numbness and tingling in her left leg/foot. Feels like she is walking on rocks. No specific alleviating or aggravating factors.   She had improvement with prednisone from PCP. She is taking neurontin with some relief as well.   Bowel/Bladder Dysfunction: none  She does not smoke.   Conservative measures:  Physical therapy: has participated in PT with no relief- none in last year Multimodal medical therapy including regular antiinflammatories: Gabapentin, Prednisone   Injections: no epidural steroid injections  Past Surgery: no spinal surgery  Carolyn Matthews has no symptoms of cervical myelopathy.  The symptoms are causing a significant impact on the patient's life.   Review of Systems:  A 10 point review of systems is negative, except for the pertinent positives and negatives detailed in the HPI.  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    Past Surgical History: Past Surgical History:  Procedure Laterality Date   ANKLE FRACTURE SURGERY  05/2003   BREAST BIOPSY  03/2004   Negative   BREAST BIOPSY Right 12/27/2014   fat necrosis / fibrocystic changes   BREAST CYST ASPIRATION  05/2007   Bilateral-benign   BREAST LUMPECTOMY   12/1977   TOTAL ABDOMINAL HYSTERECTOMY  09/1996   Fibroids    Allergies: Allergies as of 02/23/2023 - Review Complete 02/23/2023  Allergen Reaction Noted   Egg-derived products Other (See Comments) 12/07/2007   Hepatitis a vaccine Other (See Comments) 04/12/2013    Medications: Outpatient Encounter Medications as of 02/23/2023  Medication Sig   conjugated estrogens (PREMARIN) vaginal cream Place 1 Applicatorful vaginally 2 (two) times a week. 0.5 gram vaginally at bedtime twice weekly   fluocinonide cream (LIDEX) 0.05 % Apply topically 2 (two) times daily.   fluticasone (FLONASE) 50 MCG/ACT nasal spray USE 2 SPRAYS IN EACH NOSTRIL ONCE A DAY   gabapentin (NEURONTIN) 300 MG capsule Take 1 capsule (300 mg total) by mouth 2 (two) times daily.   hydrOXYzine (ATARAX/VISTARIL) 25 MG tablet Take 1-2 tablets by mouth every 8 hours as needed for anxiety and panic   Multiple Vitamin (MULTIVITAMIN) tablet Take 1 tablet by mouth daily.   sertraline (ZOLOFT) 50 MG tablet Take 1.5 tablets (75 mg total) by mouth daily.   [DISCONTINUED] predniSONE (DELTASONE) 10 MG tablet Take 4 pills once daily by mouth for 3 days, then 3 pills daily for 3 days, then 2 pills daily for 3 days then 1 pill daily for 3 days then stop (Patient not taking: Reported on 02/23/2023)   No facility-administered encounter medications on file as of 02/23/2023.    Social History: Social History   Tobacco Use   Smoking status: Never  Smokeless tobacco: Never  Vaping Use   Vaping status: Never Used  Substance Use Topics   Alcohol use: Yes    Alcohol/week: 0.0 standard drinks of alcohol    Comment: occasional use   Drug use: No    Family Medical History: Family History  Problem Relation Age of Onset   Diabetes Father    Hypertension Mother    Breast cancer Daughter        (inflammatory)-now has brain tumor   Diabetes Paternal Grandmother    Breast cancer Other        mat great aunt   Breast cancer Daughter         2nd daughter with breast cancer    Colon cancer Neg Hx    Rectal cancer Neg Hx    Stomach cancer Neg Hx    Esophageal cancer Neg Hx     Physical Examination: Vitals:   02/23/23 1407  BP: 134/70    General: Patient is well developed, well nourished, calm, collected, and in no apparent distress. Attention to examination is appropriate.  Respiratory: Patient is breathing without any difficulty.   NEUROLOGICAL:     Awake, alert, oriented to person, place, and time.  Speech is clear and fluent. Fund of knowledge is appropriate.   Cranial Nerves: Pupils equal round and reactive to light.  Facial tone is symmetric.    No posterior lumbar tenderness.   No abnormal lesions on exposed skin.   Strength: Side Biceps Triceps Deltoid Interossei Grip Wrist Ext. Wrist Flex.  R 5 5 5 5 5 5 5   L 5 5 5 5 5 5 5    Side Iliopsoas Quads Hamstring PF DF EHL  R 5 5 5 5 5 5   L 5 5 5 5 5 5    Reflexes are 3+ and symmetric at the biceps, brachioradialis, patella and achilles.   Hoffman's is positive bilaterally.  Clonus is not present.   Bilateral upper and lower extremity sensation is intact to light touch.     She is able to heel stand. She is able to stand on toes on right and left independently, but has pain when doing this on left.   Gait is normal.     Medical Decision Making  Imaging: Lumbar xrays dated 12/26/22:  FINDINGS: Five non-rib-bearing lumbar vertebra. Broad-based levo scoliotic curvature which is increased from prior exam, 10 degrees when measured from superior endplate of T12 to inferior endplate of L4. Similar trace anterolisthesis of L3 on L4. Progression in L3-L4 disc space narrowing and spurring. Similar L5-S1 disc space narrowing. Normal vertebral body heights, no evidence of fracture or compression deformity. L3-L4 facet hypertrophy. No visible pars defects.   IMPRESSION: 1. Progression in degenerative disc disease at L3-L4 since 2022 with unchanged trace  anterolisthesis. 2. Similar L5-S1 degenerative disc disease. 3. Increased levo scoliotic curvature of the lumbar spine.     Electronically Signed   By: Narda Rutherford M.D.   On: 12/26/2022 15:40   Lumbar MRI dated 01/17/23:  FINDINGS: Segmentation:  Standard.   Alignment:  No listhesis.  Convex left curvature is noted.   Vertebrae: No fracture, evidence of discitis or worrisome lesion. Small hemangioma in L2 is unchanged.   Conus medullaris and cauda equina: Conus extends to the L1 level. Conus and cauda equina appear normal.   Paraspinal and other soft tissues: Negative.   Disc levels:   T10-11 and T11-12 are imaged in the sagittal plane only and negative.   T12-L1: Negative.  L1-2: Negative.   L2-3: Negative.   L3-4: There is a shallow disc bulge and mild-to-moderate facet arthropathy. The central canal and foramina remain open.   L4-5: Mild facet degenerative change and a minimal disc bulge. No stenosis.   L5-S1: Again seen is a left subarticular recess disc extrusion impinging on the left S1 root. Extension of the disc to the right now results in mild encroachment on the right S1 root. The foramina are open.   IMPRESSION: 1. A left subarticular recess disc extrusion at L5-S1 impinges on the left S1 root. Extension of the disc to the right now results in mild encroachment on the right S1 root. 2. No change in mild spondylosis at L3-4 and L4-5.     Electronically Signed   By: Drusilla Kanner M.D.   On: 01/30/2023 09:48  I have personally reviewed the images and agree with the above interpretation.  Assessment and Plan: Carolyn Matthews has constant LBP with constant posterior left leg pain to her foot x several months. She has seen some relief with prednisone. No right leg pain. She has numbness and tingling in her left leg/foot.   She has lumbar spondylosis with disc at L5-S1 that impinges on left S1 nerve root and mildly encroaches on right S1 nerve  root.   Treatment options discussed with patient and following plan made:   - PT for lumbar spine. Orders to [facility] PT in [place].  - Discussed lumbar injections, she declines for now.  - Follow up with me in 6-8 weeks and prn.   Of note, she is hyper-reflexic on exam and has positive hoffmans in both hands. No myelopathic symptoms. No neck or arm pain.   Reviewed with Dr. Katrinka Blazing after her visit and he recommends cervical MRI. Called patient and she agrees. She needs valium prior to MRI. Will set up phone visit to review cervical MRI once I get the results.   I spent a total of 35 minutes in face-to-face and non-face-to-face activities related to this patient's care today including review of outside records, review of imaging, review of symptoms, physical exam, discussion of differential diagnosis, discussion of treatment options, and documentation.   Thank you for involving me in the care of this patient.   Drake Leach PA-C Dept. of Neurosurgery

## 2023-02-23 ENCOUNTER — Ambulatory Visit: Payer: Medicare HMO | Admitting: Orthopedic Surgery

## 2023-02-23 ENCOUNTER — Encounter: Payer: Self-pay | Admitting: Orthopedic Surgery

## 2023-02-23 VITALS — BP 134/70 | Ht 63.25 in | Wt 179.0 lb

## 2023-02-23 DIAGNOSIS — M5416 Radiculopathy, lumbar region: Secondary | ICD-10-CM

## 2023-02-23 DIAGNOSIS — R292 Abnormal reflex: Secondary | ICD-10-CM

## 2023-02-23 DIAGNOSIS — M5126 Other intervertebral disc displacement, lumbar region: Secondary | ICD-10-CM

## 2023-02-23 DIAGNOSIS — M5127 Other intervertebral disc displacement, lumbosacral region: Secondary | ICD-10-CM

## 2023-02-23 DIAGNOSIS — M4726 Other spondylosis with radiculopathy, lumbar region: Secondary | ICD-10-CM

## 2023-02-23 DIAGNOSIS — M47816 Spondylosis without myelopathy or radiculopathy, lumbar region: Secondary | ICD-10-CM

## 2023-02-23 MED ORDER — DIAZEPAM 5 MG PO TABS: ORAL_TABLET | ORAL | 0 refills | Status: AC

## 2023-02-23 NOTE — Patient Instructions (Signed)
It was so nice to see you today. Thank you so much for coming in.    You have some wear and tear (arthritis) in your lower back along with disc herniation at L5-S1 that is likely causing your back and left leg pain.   I sent physical therapy orders to [facility] in [place]. You can call them at [phone] if you don't hear from them to schedule your visit. The are located at [address], [place].   If pain gets worse, we can consider lumbar injections.   Let me know if you develop any neck or arm pain or have any issues with balance/hands not working (the things we talked about).   I will see you back in 6-8 weeks. Please do not hesitate to call if you have any questions or concerns. You can also message me in MyChart.   Drake Leach PA-C [phone]     The physicians and staff at [facility] Neurosurgery at [facility] are committed to providing excellent care. You may receive a survey asking for feedback about your experience at our office. We value you your feedback and appreciate you taking the time to to fill it out. The [facility] leadership team is also available to discuss your experience in person, feel free to contact us [phone].

## 2023-02-27 NOTE — Progress Notes (Unsigned)
Carolyn Matthews [address] [place] [address] Phone: [phone] Subjective:   Carolyn Matthews am a scribe for Dr. Katrinka Blazing and Dr. Jean Rosenthal.   I'm seeing this patient by the request  of:  Tower, Audrie Gallus, MD  CC: Low back pain follow-up  NGE:[id number]  02/06/2022 Degenerative cervical spinal stenosis is likely.  Discussed different medications but patient wanted to try home exercises.  Given home exercises and work with Event organiser.  Discussed formal physical therapy and patient declined at the moment.  Increase activity slowly.  Follow-up again in 6 to 8 weeks.     Updated 03/03/2023 Carolyn Matthews is a 68 y.o. female coming in with complaint of sciatic pain, reviewing the patient's chart has been seen by neurosurgery at an outside facility.  Will send to physical therapy.  Continues to have numbness in L foot and feels like she is walking on rocks. Patient complains about R hip pain.  Has not had any neck pain but was recommended to get MRI of neck due to lower back issues.   Patient is scheduled for an MRI of the cervical spine on February 1.   MRI Lumbar spine December 2024 IMPRESSION: 1. A left subarticular recess disc extrusion at L5-S1 impinges on the left S1 root. Extension of the disc to the right now results in mild encroachment on the right S1 root. 2. No change in mild spondylosis at L3-4 and L4-5.  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   Past Surgical History:  Procedure Laterality Date   ANKLE FRACTURE SURGERY  05/2003   BREAST BIOPSY  03/2004   Negative   BREAST BIOPSY Right 12/27/2014   fat  necrosis / fibrocystic changes   BREAST CYST ASPIRATION  05/2007   Bilateral-benign   BREAST LUMPECTOMY  12/1977   TOTAL ABDOMINAL HYSTERECTOMY  09/1996   Fibroids   Social History   Socioeconomic History   Marital status: Married    Spouse name: Not on file   Number of children: 2   Years of education: Not on file   Highest education level: Not on file  Occupational History   Occupation: Chartered loss adjuster: [employer]    Comment: [employer]    Employer: [employer]  Tobacco Use   Smoking status: Never   Smokeless tobacco: Never  Vaping Use   Vaping status: Never Used  Substance and Sexual Activity   Alcohol use: Yes    Alcohol/week: 0.0 standard drinks of alcohol    Comment: occasional use   Drug use: No   Sexual activity: Yes    Birth control/protection: Surgical    Comment: Hysterectomy  Other Topics Concern   Not on file  Social History Narrative   Not on file   Social Drivers of Health   Financial Resource Strain: Not on file  Food Insecurity: Not on file  Transportation Needs: Not on file  Physical Activity: Not on file  Stress: Not on file  Social Connections: Not on file   Allergies  Allergen Reactions   Egg-Derived Products Other (See Comments)    REACTION: SEVERE REACTION PV 4-22 pt states she gets flu like symptoms all but fever, no anaphylasis   Hepatitis A Vaccine Other (See Comments)    ? Allergic reaction; itching,weakness almost immediate after taking 1st vaccine   Family History  Problem Relation Age of Onset   Diabetes Father    Hypertension Mother    Breast cancer Daughter        (inflammatory)-now has brain tumor   Diabetes Paternal Grandmother    Breast cancer Other        mat great aunt   Breast cancer Daughter        2nd daughter with breast cancer    Colon cancer Neg Hx    Rectal cancer Neg Hx    Stomach cancer Neg Hx    Esophageal cancer Neg Hx       Current Outpatient Medications (Respiratory):     fluticasone (FLONASE) 50 MCG/ACT nasal spray, USE 2 SPRAYS IN EACH NOSTRIL ONCE A DAY    Current Outpatient Medications (Other):    conjugated estrogens (PREMARIN) vaginal cream, Place 1 Applicatorful vaginally 2 (two) times a week. 0.5 gram vaginally at bedtime twice weekly   diazepam (VALIUM) 5 MG tablet, 1 po 30 minutes prior to MRI scan. May repeat x 1 right before MRI scan if needed. You will need a driver.   fluocinonide cream (LIDEX) 0.05 %, Apply topically 2 (two) times daily.   gabapentin (NEURONTIN) 300 MG capsule, Take 1 capsule (300 mg total) by mouth 2 (two) times daily.   hydrOXYzine (ATARAX/VISTARIL) 25 MG tablet, Take 1-2 tablets by mouth every 8 hours as needed for anxiety and panic   Multiple Vitamin (MULTIVITAMIN) tablet, Take 1 tablet by mouth daily.   sertraline (ZOLOFT) 50 MG tablet, Take 1.5 tablets (75 mg total) by mouth daily.   Reviewed prior external information including notes and imaging from  primary care provider As well as notes that were available from care everywhere and other healthcare systems.  Past medical history, social, surgical and family history all reviewed in electronic medical record.  No pertanent information unless stated regarding to the chief complaint.   Review of Systems:  No headache, visual changes, nausea, vomiting, diarrhea, constipation, dizziness, abdominal pain, skin rash, fevers, chills, night sweats, weight loss, swollen lymph nodes, body aches, joint swelling, chest pain, shortness of breath, mood changes. POSITIVE muscle aches  Objective  Blood pressure 130/60, pulse 77, height 5\' 3"  (1.6 m), SpO2 99%.   General: No apparent distress alert and oriented x3 mood and affect normal, dressed appropriately.  HEENT: Pupils equal, extraocular movements intact  Respiratory: Patient's speak in full sentences and does not appear short of breath  Cardiovascular: No lower extremity edema, non tender, no erythema  Neck exam shows loss of  lordosis.  And does have some weakness in the hands noted. Foot exam shows breakdown of the transverse arch noted.  Patient does have overpronation noted of the hindfoot.  Patient seems to be neurovascularly intact.  No worsening pain with extension of the leg.   [id number]; 15 additional minutes spent for Therapeutic exercises as stated in above notes.  This included exercises focusing on stretching, strengthening, with significant focus on eccentric aspects.   Long term goals include an improvement in range of motion, strength, endurance as well as avoiding reinjury. Patient's frequency would include in 1-2 times a day, 3-5 times a week for a duration of 6-12  weeks. Exercises for the foot include:  Stretches to help lengthen the lower leg and plantar fascia areas Theraband exercises for the lower leg and ankle to help strengthen the surrounding area- dorsiflexion, plantarflexion, inversion, eversion Massage rolling on the plantar surface of the foot with a frozen bottle, tennis ball or golf ball Towel or marble pick-ups to strengthen the plantar surface of the foot Weight bearing exercises to increase balance and overall stability    Proper technique shown and discussed handout in great detail with ATC.  All questions were discussed and answered.   Impression and Recommendations:    The above documentation has been reviewed and is accurate and complete Judi Saa, DO

## 2023-03-03 ENCOUNTER — Ambulatory Visit
Admission: RE | Admit: 2023-03-03 | Discharge: 2023-03-03 | Disposition: A | Discharge status: Home / Self Care | Payer: Medicare HMO | Source: Ambulatory Visit | Attending: Family Medicine | Admitting: Family Medicine

## 2023-03-03 ENCOUNTER — Encounter: Payer: Self-pay | Admitting: Family Medicine

## 2023-03-03 ENCOUNTER — Ambulatory Visit: Payer: Medicare HMO | Admitting: Family Medicine

## 2023-03-03 VITALS — BP 130/60 | HR 77 | Ht 63.0 in

## 2023-03-03 DIAGNOSIS — Z1231 Encounter for screening mammogram for malignant neoplasm of breast: Secondary | ICD-10-CM | POA: Diagnosis not present

## 2023-03-03 DIAGNOSIS — M79672 Pain in left foot: Secondary | ICD-10-CM | POA: Diagnosis not present

## 2023-03-03 NOTE — Patient Instructions (Addendum)
PF exercises. Spenco orthotics (original). Avoid being barefoot. Return 6 to 8 weeks.

## 2023-03-03 NOTE — Assessment & Plan Note (Signed)
Known arthritic changes of the ankle that is caused patient to have an antalgic gait noted.  Discussed with patient about icing regimen and home exercises, proper shoes and over-the-counter orthotics.  Discussed avoiding being barefoot in the house.  I do think that there is a possibility of some lumbar radiculopathy that could also be potentially contributing.  Discussed with patient about icing regimen and home exercises otherwise.  Follow-up again in 6 to 8 weeks otherwise.

## 2023-03-03 NOTE — Assessment & Plan Note (Signed)
Differential is quite broad.  Do think that there is significant breakdown of the longitudinal arch that could be potentially contributing.  Pain seems to be worse with activity that goes against plantar fasciitis.  Patient does have numbness some could be a potential neuroma but highly unlikely but I do think lumbar radiculopathy is also within the differential.  We discussed with patient and decided on over-the-counter orthotics, proper shoes, home exercises given, increase activity slowly.  Follow-up again in 6 to 8 weeks.

## 2023-03-04 ENCOUNTER — Encounter: Payer: Self-pay | Admitting: Family Medicine

## 2023-03-07 ENCOUNTER — Ambulatory Visit
Admission: RE | Admit: 2023-03-07 | Discharge: 2023-03-07 | Disposition: A | Discharge status: Home / Self Care | Payer: Medicare HMO | Source: Ambulatory Visit | Attending: Orthopedic Surgery | Admitting: Orthopedic Surgery

## 2023-03-07 DIAGNOSIS — M47812 Spondylosis without myelopathy or radiculopathy, cervical region: Secondary | ICD-10-CM | POA: Diagnosis not present

## 2023-03-07 DIAGNOSIS — M50223 Other cervical disc displacement at C6-C7 level: Secondary | ICD-10-CM | POA: Diagnosis not present

## 2023-03-07 DIAGNOSIS — M50222 Other cervical disc displacement at C5-C6 level: Secondary | ICD-10-CM | POA: Diagnosis not present

## 2023-03-07 DIAGNOSIS — R292 Abnormal reflex: Secondary | ICD-10-CM

## 2023-03-20 DIAGNOSIS — Z135 Encounter for screening for eye and ear disorders: Secondary | ICD-10-CM | POA: Diagnosis not present

## 2023-03-20 DIAGNOSIS — H5213 Myopia, bilateral: Secondary | ICD-10-CM | POA: Diagnosis not present

## 2023-03-20 DIAGNOSIS — H52223 Regular astigmatism, bilateral: Secondary | ICD-10-CM | POA: Diagnosis not present

## 2023-03-20 DIAGNOSIS — H524 Presbyopia: Secondary | ICD-10-CM | POA: Diagnosis not present

## 2023-03-26 ENCOUNTER — Encounter: Payer: Self-pay | Admitting: Family Medicine

## 2023-03-26 NOTE — Progress Notes (Unsigned)
Telephone Visit- Progress Note: Referring Physician:  Tower, Audrie Gallus, MD [address] [place],  [place] [address]  Primary Physician:  Tower, Audrie Gallus, MD  This visit was performed via telephone.  Patient location: home Provider location: office  I spent a total of 8 minutes non-face-to-face activities for this visit on the date of this encounter including review of current clinical condition and response to treatment.    Patient has given verbal consent to this telephone visits and we reviewed the limitations of a telephone visit. Patient wishes to proceed.    Chief Complaint:  review cervical MRI  History of Present Illness: Carolyn Matthews is a 68 y.o. female has a history of MVP, OSA, hyperlipidemia, prediabetes.  Last seen by m on 02/23/23 for LBP and left leg pain. She has known  lumbar spondylosis with disc at L5-S1 that impinges on left S1 nerve root and mildly encroaches on right S1 nerve root.   PT was ordered for her lower back.   Cervical MRI was ordered as she was hyper-reflexic on exam and had positive hoffmans in both hands. No myelopathic symptoms. No neck or arm pain.   Phone visit scheduled to review her cervical MRI.   She has no neck or arm pain. No dexterity or balance issues. She does have intermittent numbness/tingling in her hands that is worse at night. She also continues with numbness and tingling in her left leg/foot.   She has not started PT yet for her lumbar spine. Wanted to review her cervical MRI results before starting.   She does not smoke.    Conservative measures:  Physical therapy: has participated in PT with no relief- none in last year Multimodal medical therapy including regular antiinflammatories: Gabapentin, Prednisone   Injections: no epidural steroid injections   Past Surgery: no spinal surgery   Nana Vastine has no symptoms of cervical myelopathy.   The symptoms are causing a significant impact on the patient's  life.   Exam: No exam done as this was a telephone encounter.     Imaging: Cervical MRI dated 03/07/23:  FINDINGS: Alignment: No malalignment.   Vertebrae: No fracture or focal bone lesion. Some discogenic endplate marrow edema at the C6-7 level which could relate to cervicalgia.   Cord: No cord compression or focal cord lesion.   Posterior Fossa, vertebral arteries, paraspinal tissues: Negative   Disc levels:   No abnormality from the foramen magnum through C4-5.   C5-6: Degenerative spondylosis with endplate osteophytes and bulging of the disc. Narrowing of the ventral subarachnoid space but no compression of cord. AP diameter of the canal in the midline 9.3 mm. There is bilateral foraminal narrowing that could possibly affect either C6 nerve.   C6-7: Endplate osteophytes and bulging of the disc. Narrowing of the ventral subarachnoid space but no compression of cord. AP diameter of the canal in the midline 9.3 mm. Bilateral foraminal narrowing that could possibly affect either C7 nerve. As noted above, there is some discogenic endplate edema which could relate to regional pain.   C7-T1: Minimal disc bulge and facet degeneration.  No stenosis.   IMPRESSION: 1. C5-6: Degenerative spondylosis with endplate osteophytes and bulging of the disc. Narrowing of the ventral subarachnoid space but no compression of cord. Bilateral foraminal narrowing that could possibly affect either C6 nerve. 2. C6-7: Degenerative spondylosis with endplate osteophytes and bulging of the disc. Narrowing of the ventral subarachnoid space but no compression of cord. Bilateral foraminal narrowing  that could possibly affect either C7 nerve. Some discogenic endplate edema which could relate to regional pain.     Electronically Signed   By: Paulina Fusi M.D.   On: 03/22/2023 17:55  I have personally reviewed the images and agree with the above interpretation.  Assessment and Plan: [name]  was hyper-reflexic on exam and had positive hoffmans in both hands. No myelopathic symptoms. No neck or arm pain. She has intermittent numbness and tingling in her hands that is worse at night.   Cervical MRI shows cervical spondylosis with mild cervical stenosis C5-C7, no cord compression. She has mild bilateral foraminal stenosis C5-C7 as well.   Treatment options discussed with patient and following plan made:   - No cord compression seen on her cervical MRI. No further treatment recommended for her neck.  - Recommend EMG of bilateral upper extremities to evaluate numbness and tingling. I suspect this may be due to carpal tunnel. She declines.  - Recommend she start PT for her lumbar spine.  - If no improvement with PT, may also consider EMG of bilateral lower extremities.  - She wanted me to cancel her follow up and did not want to schedule another appointment. She states she will call after PT to schedule one. Will also message her in 6 weeks to check on her progress.   Drake Leach PA-C Neurosurgery

## 2023-03-27 ENCOUNTER — Ambulatory Visit: Payer: Medicare HMO | Admitting: Orthopedic Surgery

## 2023-03-27 ENCOUNTER — Encounter: Payer: Self-pay | Admitting: Orthopedic Surgery

## 2023-03-27 DIAGNOSIS — R200 Anesthesia of skin: Secondary | ICD-10-CM

## 2023-03-27 DIAGNOSIS — M47812 Spondylosis without myelopathy or radiculopathy, cervical region: Secondary | ICD-10-CM

## 2023-03-27 DIAGNOSIS — R202 Paresthesia of skin: Secondary | ICD-10-CM | POA: Diagnosis not present

## 2023-04-06 ENCOUNTER — Other Ambulatory Visit (HOSPITAL_COMMUNITY): Payer: Self-pay

## 2023-04-06 ENCOUNTER — Other Ambulatory Visit: Payer: Self-pay

## 2023-04-06 MED ORDER — GABAPENTIN 300 MG PO CAPS
300.0000 mg | ORAL_CAPSULE | Freq: Two times a day (BID) | ORAL | 3 refills | Status: DC
  Filled 2023-04-06: qty 60, 30d supply, fill #0

## 2023-04-06 NOTE — Telephone Encounter (Signed)
 Gabapentin 300 mg bid   LAST APPOINTMENT DATE: 12/26/2022   NEXT APPOINTMENT DATE: Visit date not found    LAST REFILL: 12/17/22  QTY: #60 1 rf

## 2023-04-07 ENCOUNTER — Other Ambulatory Visit (HOSPITAL_COMMUNITY): Payer: Self-pay

## 2023-04-08 ENCOUNTER — Other Ambulatory Visit: Payer: Self-pay

## 2023-04-08 MED ORDER — SERTRALINE HCL 50 MG PO TABS: 75.0000 mg | ORAL_TABLET | Freq: Every day | ORAL | 2 refills | Status: DC

## 2023-04-08 MED ORDER — FLUTICASONE PROPIONATE 50 MCG/ACT NA SUSP: 2.0000 | Freq: Every day | NASAL | 2 refills | Status: AC

## 2023-04-08 NOTE — Telephone Encounter (Signed)
   LAST APPOINTMENT DATE: 12/26/2022  No follow up scheduled at this time.

## 2023-04-16 ENCOUNTER — Ambulatory Visit: Payer: Medicare HMO | Admitting: Orthopedic Surgery

## 2023-07-08 ENCOUNTER — Ambulatory Visit: Admitting: Family Medicine

## 2023-07-13 ENCOUNTER — Other Ambulatory Visit: Payer: Self-pay | Admitting: *Deleted

## 2023-07-13 MED ORDER — GABAPENTIN 300 MG PO CAPS: 300.0000 mg | ORAL_CAPSULE | Freq: Two times a day (BID) | ORAL | 1 refills | Status: DC

## 2023-07-13 NOTE — Telephone Encounter (Signed)
 Received fax requesting Rx be sent to [facility]   Last OV was on 12/26/22  Last filled on 04/06/23 #60 caps/ 3 refill (can't xfer Rx to mail order)

## 2023-07-22 ENCOUNTER — Ambulatory Visit: Admitting: Family Medicine

## 2023-07-22 ENCOUNTER — Encounter: Payer: Self-pay | Admitting: Family Medicine

## 2023-07-22 VITALS — BP 130/72 | HR 71 | Temp 98.1°F | Ht 63.0 in | Wt 180.1 lb

## 2023-07-22 DIAGNOSIS — M5030 Other cervical disc degeneration, unspecified cervical region: Secondary | ICD-10-CM | POA: Diagnosis not present

## 2023-07-22 DIAGNOSIS — M542 Cervicalgia: Secondary | ICD-10-CM | POA: Insufficient documentation

## 2023-07-22 MED ORDER — PREDNISONE 10 MG PO TABS: ORAL_TABLET | ORAL | 0 refills | Status: DC

## 2023-07-22 MED ORDER — CYCLOBENZAPRINE HCL 10 MG PO TABS: 5.0000 mg | ORAL_TABLET | Freq: Three times a day (TID) | ORAL | 0 refills | Status: DC | PRN

## 2023-07-22 NOTE — Progress Notes (Signed)
 Subjective:    Patient ID: Carolyn Matthews, female    DOB: Mar 04, 1955, 68 y.o.   MRN: [id number]  HPI  Wt Readings from Last 3 Encounters:  07/22/23 180 lb 2 oz (81.7 kg)  02/23/23 179 lb (81.2 kg)  12/26/22 177 lb 4 oz (80.4 kg)   31.91 kg/m  Vitals:   07/22/23 1222  BP: 130/72  Pulse: 71  Temp: 98.1 F (36.7 C)  SpO2: 98%    Pt presents with c/o neck pain  It radiates down to left arm   Started about 3 weeks ago after cleaning above her head and neck prolonged extension  Some discomfort began - not sudden but during activity   Pain starts in left neck and radiates down to her left arm and hand Tingling also  Throbbing pain   Worse when she flexes elbow with sitting  Not a lot of change with neck movement  Now pain is more in arm than neck   Is right handed   Some weak and numb feeling   Massage helps  Has not tried heat/ice  Ibuprofen did not help   Has history of deg disk dz   Last MR CS was 03/2023  IMPRESSION: 1. C5-6: Degenerative spondylosis with endplate osteophytes and bulging of the disc. Narrowing of the ventral subarachnoid space but no compression of cord. Bilateral foraminal narrowing that could possibly affect either C6 nerve. 2. C6-7: Degenerative spondylosis with endplate osteophytes and bulging of the disc. Narrowing of the ventral subarachnoid space but no compression of cord. Bilateral foraminal narrowing that could possibly affect either C7 nerve. Some discogenic endplate edema which could relate to regional pain.    Patient Active Problem List   Diagnosis Date Noted   Neck pain on left side 07/22/2023   Left foot pain 03/03/2023   Left-sided headache 11/03/2022   COVID-19 09/23/2022   Aortic atherosclerosis (HCC) 09/23/2022   Estrogen deficiency 02/26/2022   Degenerative cervical disc 02/07/2022   Allergic rhinitis 04/06/2020   OSA (obstructive sleep apnea) 01/20/2020   Degenerative disc disease, lumbar 12/08/2016    Lumbar back pain with radiculopathy affecting left lower extremity 12/02/2016   Patellar subluxation, left, initial encounter 06/23/2016   Vitamin D  deficiency 10/03/2015   Encounter for vitamin deficiency screening 11/14/2014   Colon cancer screening 08/11/2012   Anxiety disorder 02/26/2011   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   Dermatitis 06/24/2010   Routine general medical examination at a health care facility 05/23/2010   MVP (mitral valve prolapse)    Chronic headaches    Seasonal allergies    Family history of breast cancer in female    Prediabetes 04/04/2009   Hyperlipidemia 11/22/2008   Anemia, unspecified 02/12/2007   OVERACTIVE BLADDER 01/11/2007   FIBROCYSTIC BREAST DISEASE 01/11/2007   Past Medical History:  Diagnosis Date   Allergy 07/1985   seasonal   Anemia    All my life   Anxiety 2012   Chronic headaches    Family history of breast cancer in female    Daughter; gene neg per pt   Fibrocystic breast disease    GERD (gastroesophageal reflux disease)    Hyperglycemia    Diet controlled   MVP (mitral valve prolapse)    Seasonal allergies    Sleep apnea 2021   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   Past Surgical History:  Procedure Laterality Date   ANKLE FRACTURE SURGERY  05/2003   BREAST BIOPSY  03/2004   Negative  BREAST BIOPSY Right 12/27/2014   fat necrosis / fibrocystic changes   BREAST CYST ASPIRATION  05/2007   Bilateral-benign   BREAST LUMPECTOMY  12/1977   TOTAL ABDOMINAL HYSTERECTOMY  09/1996   Fibroids   Social History   Tobacco Use   Smoking status: Never   Smokeless tobacco: Never  Vaping Use   Vaping status: Never Used  Substance Use Topics   Alcohol use: Yes    Alcohol/week: 0.0 standard drinks of alcohol    Comment: occasional use   Drug use: No   Family History  Problem Relation Age of Onset   Diabetes Father    Hypertension Mother    Breast cancer Daughter        (inflammatory)-now has brain tumor   Cancer  Daughter    Diabetes Paternal Grandmother    Breast cancer Other        mat great aunt   Breast cancer Daughter        2nd daughter with breast cancer    Cancer Daughter    Colon cancer Neg Hx    Rectal cancer Neg Hx    Stomach cancer Neg Hx    Esophageal cancer Neg Hx    Allergies  Allergen Reactions   Egg-Derived Products Other (See Comments)    REACTION: SEVERE REACTION PV 4-22 pt states she gets flu like symptoms all but fever, no anaphylasis   Hepatitis A Vaccine Other (See Comments)    ? Allergic reaction; itching,weakness almost immediate after taking 1st vaccine   Current Outpatient Medications on File Prior to Visit  Medication Sig Dispense Refill   conjugated estrogens  (PREMARIN ) vaginal cream Place 1 Applicatorful vaginally 2 (two) times a week. 0.5 gram vaginally at bedtime twice weekly 30 g 3   diazepam  (VALIUM ) 5 MG tablet 1 po 30 minutes prior to MRI scan. May repeat x 1 right before MRI scan if needed. You will need a driver. 2 tablet 0   fluocinonide  cream (LIDEX ) 0.05 % Apply topically 2 (two) times daily. 30 g 0   fluticasone  (FLONASE ) 50 MCG/ACT nasal spray USE 2 SPRAYS IN EACH NOSTRIL ONCE A DAY 48 g 2   gabapentin  (NEURONTIN ) 300 MG capsule Take 1 capsule (300 mg total) by mouth 2 (two) times daily. 180 capsule 1   hydrOXYzine  (ATARAX /VISTARIL ) 25 MG tablet Take 1-2 tablets by mouth every 8 hours as needed for anxiety and panic 30 tablet 3   Multiple Vitamin (MULTIVITAMIN) tablet Take 1 tablet by mouth daily.     sertraline  (ZOLOFT ) 50 MG tablet Take 1.5 tablets (75 mg total) by mouth daily. 135 tablet 2   No current facility-administered medications on file prior to visit.    Review of Systems  Musculoskeletal:  Positive for back pain and neck pain. Negative for arthralgias and neck stiffness.  Neurological:        Tingling in left arm        Objective:   Physical Exam Constitutional:      General: She is not in acute distress.    Appearance: Normal  appearance. She is obese. She is not ill-appearing or diaphoretic.  HENT:     Mouth/Throat:     Mouth: Mucous membranes are moist.   Eyes:     Conjunctiva/sclera: Conjunctivae normal.     Pupils: Pupils are equal, round, and reactive to light.   Neck:     Vascular: No carotid bruit.     Comments: Pain with neck extension and rotation to the  left  Tender cervical musculature on left     Cardiovascular:     Rate and Rhythm: Normal rate and regular rhythm.   Musculoskeletal:     Cervical back: Normal range of motion and neck supple. No rigidity or tenderness.     Comments: Normal rom of left shoulder and wrist   See neck exam   Some mild tenderness of left upper/lower arm    Lymphadenopathy:     Cervical: No cervical adenopathy.   Skin:    General: Skin is warm and dry.     Findings: No bruising, erythema or rash.   Neurological:     Mental Status: She is alert.     Sensory: No sensory deficit.     Motor: No weakness.     Coordination: Coordination normal.     Deep Tendon Reflexes: Reflexes normal.   Psychiatric:        Mood and Affect: Mood normal.           Assessment & Plan:   Problem List Items Addressed This Visit       Musculoskeletal and Integument   Degenerative cervical disc   Current neck pain left with radicular pain  Possible C6-7   Prednisone  Flexeril  Update  Call back and Er precautions noted in detail today   See avs        Other   Neck pain on left side - Primary   In setting of DDD of CS With radicular symptoms to left arm  After working in prolonged neck extension   Suspect possible inflamed C6-7 with radicular pain  Will try prednisone  40 mg taper  Flexeril  for spasm and pain   Update if not starting to improve in a week or if worsening  Call back and Er precautions noted in detail today    If not improved consider follow up with neuro surg in [facility]

## 2023-07-22 NOTE — Assessment & Plan Note (Addendum)
 Suspect worsened after prolonged neck extension , now symptomatic  Current neck pain left with radicular pain  Possible C6-7   Prednisone  Flexeril  Update  Call back and Er precautions noted in detail today   See avs

## 2023-07-22 NOTE — Assessment & Plan Note (Signed)
 In setting of DDD of CS With radicular symptoms to left arm  After working in prolonged neck extension   Suspect possible inflamed C6-7 with radicular pain  Will try prednisone  40 mg taper  Flexeril  for spasm and pain   Update if not starting to improve in a week or if worsening  Call back and Er precautions noted in detail today    If not improved consider follow up with neuro surg in [facility]

## 2023-07-22 NOTE — Patient Instructions (Signed)
 I think you have muscle spasm and pinched nerve in your neck  Cervical disk disease is not doubt in play   Take prednisone  as directed  Try the flexeril  for spasm  Do some heat/ice  10 minutes at a time Massage is fine if it does not make you worse   If suddenly worse or you loose function of hand / grip -go to ER   Update if not starting to improve in a week or if worsening

## 2023-07-30 ENCOUNTER — Encounter: Payer: Self-pay | Admitting: Family Medicine

## 2023-08-14 ENCOUNTER — Ambulatory Visit (INDEPENDENT_AMBULATORY_CARE_PROVIDER_SITE_OTHER)

## 2023-08-14 VITALS — Ht 63.25 in | Wt 180.0 lb

## 2023-08-14 DIAGNOSIS — Z0000 Encounter for general adult medical examination without abnormal findings: Secondary | ICD-10-CM | POA: Diagnosis not present

## 2023-08-14 NOTE — Patient Instructions (Signed)
 Carolyn Matthews , Thank you for taking time out of your busy schedule to complete your Annual Wellness Visit with me. I enjoyed our conversation and look forward to speaking with you again next year. I, as well as your care team,  appreciate your ongoing commitment to your health goals. Please review the following plan we discussed and let me know if I can assist you in the future. Your Game plan/ To Do List    Follow up Visits: Next Medicare AWV with our clinical staff: 08/16/24 @ 1pm   Have you seen your provider in the last 6 months (3 months if uncontrolled diabetes)? Yes Next Office Visit with your provider: 10/20/23  Clinician Recommendations:  Aim for 30 minutes of exercise or brisk walking, 6-8 glasses of water, and 5 servings of fruits and vegetables each day.       This is a list of the screening recommended for you and due dates:  Health Maintenance  Topic Date Due   DEXA scan (bone density measurement)  Never done   Hepatitis C Screening  12/26/2023*   COVID-19 Vaccine (5 - 2024-25 season) 10/23/2027*   Flu Shot  09/04/2023   Colon Cancer Screening  06/08/2024   Medicare Annual Wellness Visit  08/13/2024   Mammogram  03/02/2025   DTaP/Tdap/Td vaccine (3 - Td or Tdap) 02/21/2027   Pneumococcal Vaccine for age over 98  Completed   Zoster (Shingles) Vaccine  Completed   Hepatitis B Vaccine  Aged Out   HPV Vaccine  Aged Out   Meningitis B Vaccine  Aged Out  *Topic was postponed. The date shown is not the original due date.    Advanced directives: (Copy Requested) Please bring a copy of your health care power of attorney and living will to the office to be added to your chart at your convenience. You can mail to [facility] [address] [place], [place] [address] or email to [email or web address] Advance Care Planning is important because it:  [x]  Makes sure you receive the medical care that is consistent with your values, goals, and preferences  [x]  It  provides guidance to your family and loved ones and reduces their decisional burden about whether or not they are making the right decisions based on your wishes.  Follow the link provided in your after visit summary or read over the paperwork we have mailed to you to help you started getting your Advance Directives in place. If you need assistance in completing these, please reach out to us  so that we can help you!

## 2023-08-14 NOTE — Progress Notes (Signed)
 Subjective:   Carolyn Matthews is a 68 y.o. who presents for a Medicare Wellness preventive visit.  As a reminder, Annual Wellness Visits don't include a physical exam, and some assessments may be limited, especially if this visit is performed virtually. We may recommend an in-person follow-up visit with your provider if needed.  Visit Complete: Virtual I connected with  Carolyn Matthews on 08/14/23 by a audio enabled telemedicine application and verified that I am speaking with the correct person using two identifiers.  Patient Location: Home  Provider Location: Office/Clinic  I discussed the limitations of evaluation and management by telemedicine. The patient expressed understanding and agreed to proceed.  Vital Signs: Because this visit was a virtual/telehealth visit, some criteria may be missing or patient reported. Any vitals not documented were not able to be obtained and vitals that have been documented are patient reported.  VideoDeclined- This patient declined Librarian, academic. Therefore the visit was completed with audio only.  Persons Participating in Visit: Patient.  AWV Questionnaire: No: Patient Medicare AWV questionnaire was not completed prior to this visit.  Cardiac Risk Factors include: advanced age (>53men, >37 women);dyslipidemia;sedentary lifestyle;obesity (BMI >30kg/m2)     Objective:    Today's Vitals   08/14/23 1314  Weight: 180 lb (81.6 kg)  Height: 5' 3.25 (1.607 m)   Body mass index is 31.63 kg/m.     08/14/2023    1:30 PM 07/11/2020    2:09 PM 06/09/2014   12:47 PM 05/26/2014    1:21 PM  Advanced Directives  Does Patient Have a Medical Advance Directive? Yes Yes Yes  Yes   Type of Estate agent of [place];Living will Healthcare Power of [place];Living will Healthcare Power of [employer] of [place];Living will   Copy of Healthcare Power of Attorney in Chart? No - copy requested         Data saved with a previous flowsheet row definition    Current Medications (verified) Outpatient Encounter Medications as of 08/14/2023  Medication Sig   conjugated estrogens  (PREMARIN ) vaginal cream Place 1 Applicatorful vaginally 2 (two) times a week. 0.5 gram vaginally at bedtime twice weekly   fluocinonide  cream (LIDEX ) 0.05 % Apply topically 2 (two) times daily.   fluticasone  (FLONASE ) 50 MCG/ACT nasal spray USE 2 SPRAYS IN EACH NOSTRIL ONCE A DAY   gabapentin  (NEURONTIN ) 300 MG capsule Take 1 capsule (300 mg total) by mouth 2 (two) times daily.   hydrOXYzine  (ATARAX /VISTARIL ) 25 MG tablet Take 1-2 tablets by mouth every 8 hours as needed for anxiety and panic   Multiple Vitamin (MULTIVITAMIN) tablet Take 1 tablet by mouth daily.   sertraline  (ZOLOFT ) 50 MG tablet Take 1.5 tablets (75 mg total) by mouth daily.   cyclobenzaprine  (FLEXERIL ) 10 MG tablet Take 0.5-1 tablets (5-10 mg total) by mouth 3 (three) times daily as needed for muscle spasms (neck pain). Caution of sedation (Patient not taking: Reported on 08/14/2023)   diazepam  (VALIUM ) 5 MG tablet 1 po 30 minutes prior to MRI scan. May repeat x 1 right before MRI scan if needed. You will need a driver. (Patient not taking: Reported on 08/14/2023)   predniSONE  (DELTASONE ) 10 MG tablet Take 4 pills once daily by mouth for 3 days, then 3 pills daily for 3 days, then 2 pills daily for 3 days then 1 pill daily for 3 days then stop (Patient not taking: Reported on 08/14/2023)   No facility-administered encounter medications on file as of  08/14/2023.    Allergies (verified) Egg-derived products and Hepatitis a vaccine   History: Past Medical History:  Diagnosis Date   Allergy 07/1985   seasonal   Anemia    All my life   Anxiety 2012   Chronic headaches    Family history of breast cancer in female    Daughter; gene neg per pt   Fibrocystic breast disease    GERD (gastroesophageal reflux disease)    Hyperglycemia    Diet  controlled   MVP (mitral valve prolapse)    Seasonal allergies    Sleep apnea 2021   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   Past Surgical History:  Procedure Laterality Date   ANKLE FRACTURE SURGERY  05/2003   BREAST BIOPSY  03/2004   Negative   BREAST BIOPSY Right 12/27/2014   fat necrosis / fibrocystic changes   BREAST CYST ASPIRATION  05/2007   Bilateral-benign   BREAST LUMPECTOMY  12/1977   TOTAL ABDOMINAL HYSTERECTOMY  09/1996   Fibroids   Family History  Problem Relation Age of Onset   Diabetes Father    Hypertension Mother    Breast cancer Daughter        (inflammatory)-now has brain tumor   Cancer Daughter    Diabetes Paternal Grandmother    Breast cancer Other        mat great aunt   Breast cancer Daughter        2nd daughter with breast cancer    Cancer Daughter    Colon cancer Neg Hx    Rectal cancer Neg Hx    Stomach cancer Neg Hx    Esophageal cancer Neg Hx    Social History   Socioeconomic History   Marital status: Married    Spouse name: Not on file   Number of children: 2   Years of education: Not on file   Highest education level: Not on file  Occupational History   Occupation: Chartered loss adjuster: [employer]    Comment: [employer]    Employer: [employer]  Tobacco Use   Smoking status: Never   Smokeless tobacco: Never  Vaping Use   Vaping status: Never Used  Substance and Sexual Activity   Alcohol use: Yes    Alcohol/week: 0.0 standard drinks of alcohol    Comment: occasional use   Drug use: No   Sexual activity: Yes    Birth control/protection: Surgical    Comment: Hysterectomy  Other Topics Concern   Not on file  Social History Narrative   Not on file   Social Drivers of Health   Financial Resource Strain: Low Risk  (08/14/2023)   Overall Financial Resource Strain (CARDIA)    Difficulty of Paying Living Expenses: Not hard at all  Food Insecurity: No Food Insecurity (08/14/2023)   Hunger Vital Sign     Worried About Running Out of Food in the Last Year: Never true    Ran Out of Food in the Last Year: Never true  Transportation Needs: No Transportation Needs (08/14/2023)   PRAPARE - Administrator, Civil Service (Medical): No    Lack of Transportation (Non-Medical): No  Physical Activity: Inactive (08/14/2023)   Exercise Vital Sign    Days of Exercise per Week: 0 days    Minutes of Exercise per Session: 0 min  Stress: No Stress Concern Present (08/14/2023)   [employer] of Occupational Health - Occupational Stress Questionnaire    Feeling of Stress: Not  at all  Social Connections: Moderately Integrated (08/14/2023)   Social Connection and Isolation Panel    Frequency of Communication with Friends and Family: Three times a week    Frequency of Social Gatherings with Friends and Family: Once a week    Attends Religious Services: More than 4 times per year    Active Member of [employer] or Organizations: No    Attends Engineer, structural: Never    Marital Status: Married    Tobacco Counseling Counseling given: Not Answered    Clinical Intake:  Pre-visit preparation completed: Yes  Pain : No/denies pain     BMI - recorded: 31.63 Nutritional Status: BMI > 30  Obese Nutritional Risks: None Diabetes: No  Lab Results  Component Value Date   HGBA1C 5.8 02/26/2022   HGBA1C 6.1 (H) 01/23/2021   HGBA1C 6.0 01/20/2020     How often do you need to have someone help you when you read instructions, pamphlets, or other written materials from your doctor or pharmacy?: 1 - Never  Interpreter Needed?: No  Information entered by :: B.Sisto Granillo,LPN   Activities of Daily Living     08/14/2023    1:30 PM  In your present state of health, do you have any difficulty performing the following activities:  Hearing? 0  Vision? 0  Difficulty concentrating or making decisions? 0  Walking or climbing stairs? 0  Dressing or bathing? 0  Doing errands, shopping? 0   Preparing Food and eating ? N  Using the Toilet? N  In the past six months, have you accidently leaked urine? N  Do you have problems with loss of bowel control? N  Managing your Medications? N  Managing your Finances? N  Housekeeping or managing your Housekeeping? N    Patient Care Team: [place], Laine LABOR, MD as PCP - [facility]  I have updated your Care Teams any recent Medical Services you may have received from other providers in the past year.     Assessment:   This is a routine wellness examination for [place].  Hearing/Vision screen Vision Screening - Comments:: Pt says her vision is good [facility]   Goals Addressed             This Visit's Progress    Patient Stated       Pt says she wants to lose weight-20lbs via walking       Depression Screen     08/14/2023    1:23 PM 09/23/2022   11:13 AM 02/26/2022   12:14 PM 01/21/2021    3:26 PM 01/20/2020   12:20 PM 12/10/2018    9:20 AM 12/02/2017    9:05 AM  PHQ 2/9 Scores  PHQ - 2 Score 0 4 3 0 0 0 1  PHQ- 9 Score  9 5 0 0 3     Fall Risk     08/14/2023    1:18 PM 09/23/2022   11:13 AM 02/26/2022   12:14 PM  Fall Risk   Falls in the past year? 0 0 0  Number falls in past yr: 0 0 0  Injury with Fall? 0 0 0  Risk for fall due to : No Fall Risks No Fall Risks No Fall Risks  Follow up Education provided;Falls prevention discussed Falls evaluation completed Falls evaluation completed      Data saved with a previous flowsheet row definition    MEDICARE RISK AT HOME:  Medicare Risk at Home Any  stairs in or around the home?: Yes If so, are there any without handrails?: Yes Home free of loose throw rugs in walkways, pet beds, electrical cords, etc?: Yes Adequate lighting in your home to reduce risk of falls?: Yes Life alert?: No Use of a cane, walker or w/c?: No Grab bars in the bathroom?: No Shower chair or bench in shower?: No Elevated toilet seat or a handicapped toilet?:  No  TIMED UP AND GO:  Was the test performed?  No  Cognitive Function: 6CIT completed        08/14/2023    1:32 PM  6CIT Screen  What Year? 0 points  What month? 0 points  What time? 0 points  Count back from 20 0 points  Months in reverse 0 points  Repeat phrase 0 points  Total Score 0 points    Immunizations Immunization History  Administered Date(s) Administered   Hepatitis A 08/11/2012   PFIZER(Purple Top)SARS-COV-2 Vaccination 05/13/2019, 06/03/2019, 02/27/2020   PNEUMOCOCCAL CONJUGATE-20 02/26/2022   Pfizer Covid-19 Vaccine Bivalent Booster 68yrs & up 08/01/2021   Td 02/03/2006   Tdap 02/20/2017   Zoster Recombinant(Shingrix) 06/06/2020, 11/29/2020    Screening Tests Health Maintenance  Topic Date Due   DEXA SCAN  Never done   Hepatitis C Screening  12/26/2023 (Originally 01/14/1974)   COVID-19 Vaccine (5 - 2024-25 season) 10/23/2027 (Originally 10/05/2022)   INFLUENZA VACCINE  09/04/2023   Colonoscopy  06/08/2024   Medicare Annual Wellness (AWV)  08/13/2024   MAMMOGRAM  03/02/2025   DTaP/Tdap/Td (3 - Td or Tdap) 02/21/2027   Pneumococcal Vaccine: 50+ Years  Completed   Zoster Vaccines- Shingrix  Completed   Hepatitis B Vaccines  Aged Out   HPV VACCINES  Aged Out   Meningococcal B Vaccine  Aged Out    Health Maintenance  Health Maintenance Due  Topic Date Due   DEXA SCAN  Never done   Health Maintenance Items Addressed: DEXA Scan already scheduled  Additional Screening:  Vision Screening: Recommended annual ophthalmology exams for early detection of glaucoma and other disorders of the eye. Would you like a referral to an eye doctor? No    Dental Screening: Recommended annual dental exams for proper oral hygiene  Community Resource Referral / Chronic Care Management: CRR required this visit?  No   CCM required this visit?  Appt scheduled with PCP   Plan:    I have personally reviewed and noted the following in the patient's chart:    Medical and social history Use of alcohol, tobacco or illicit drugs  Current medications and supplements including opioid prescriptions. Patient is not currently taking opioid prescriptions. Functional ability and status Nutritional status Physical activity Advanced directives List of other physicians Hospitalizations, surgeries, and ER visits in previous 12 months Vitals Screenings to include cognitive, depression, and falls Referrals and appointments  In addition, I have reviewed and discussed with patient certain preventive protocols, quality metrics, and best practice recommendations. A written personalized care plan for preventive services as well as general preventive health recommendations were provided to patient.   Erminio LITTIE Saris, LPN   [id number]/[id number]   After Visit Summary: (MyChart) Due to this being a telephonic visit, the after visit summary with patients personalized plan was offered to patient via MyChart   Notes: Nothing significant to report at this time.

## 2023-09-23 ENCOUNTER — Other Ambulatory Visit: Payer: Self-pay | Admitting: Medical Genetics

## 2023-09-24 ENCOUNTER — Ambulatory Visit
Admission: RE | Admit: 2023-09-24 | Discharge: 2023-09-24 | Disposition: A | Discharge status: Home / Self Care | Source: Ambulatory Visit | Attending: Family Medicine | Admitting: Family Medicine

## 2023-09-24 ENCOUNTER — Other Ambulatory Visit: Payer: 59

## 2023-09-24 ENCOUNTER — Other Ambulatory Visit
Admission: RE | Admit: 2023-09-24 | Discharge: 2023-09-24 | Disposition: A | Discharge status: Home / Self Care | Payer: Self-pay | Source: Ambulatory Visit | Attending: Medical Genetics | Admitting: Medical Genetics

## 2023-09-24 DIAGNOSIS — Z780 Asymptomatic menopausal state: Secondary | ICD-10-CM | POA: Diagnosis not present

## 2023-09-24 DIAGNOSIS — E2839 Other primary ovarian failure: Secondary | ICD-10-CM | POA: Diagnosis not present

## 2023-09-27 ENCOUNTER — Ambulatory Visit: Payer: Self-pay | Admitting: Family Medicine

## 2023-10-04 LAB — GENECONNECT MOLECULAR SCREEN: Genetic Analysis Overall Interpretation: NEGATIVE

## 2023-10-20 ENCOUNTER — Encounter: Admitting: Family Medicine

## 2023-11-20 ENCOUNTER — Ambulatory Visit (INDEPENDENT_AMBULATORY_CARE_PROVIDER_SITE_OTHER): Admitting: Family Medicine

## 2023-11-20 ENCOUNTER — Encounter: Payer: Self-pay | Admitting: Family Medicine

## 2023-11-20 VITALS — BP 129/62 | HR 69 | Temp 98.0°F | Ht 63.5 in | Wt 180.1 lb

## 2023-11-20 DIAGNOSIS — R7303 Prediabetes: Secondary | ICD-10-CM | POA: Diagnosis not present

## 2023-11-20 DIAGNOSIS — Z0000 Encounter for general adult medical examination without abnormal findings: Secondary | ICD-10-CM | POA: Diagnosis not present

## 2023-11-20 DIAGNOSIS — G4733 Obstructive sleep apnea (adult) (pediatric): Secondary | ICD-10-CM

## 2023-11-20 DIAGNOSIS — E559 Vitamin D deficiency, unspecified: Secondary | ICD-10-CM

## 2023-11-20 DIAGNOSIS — D649 Anemia, unspecified: Secondary | ICD-10-CM | POA: Diagnosis not present

## 2023-11-20 DIAGNOSIS — E7800 Pure hypercholesterolemia, unspecified: Secondary | ICD-10-CM | POA: Diagnosis not present

## 2023-11-20 DIAGNOSIS — Z1211 Encounter for screening for malignant neoplasm of colon: Secondary | ICD-10-CM

## 2023-11-20 DIAGNOSIS — F411 Generalized anxiety disorder: Secondary | ICD-10-CM

## 2023-11-20 MED ORDER — SERTRALINE HCL 50 MG PO TABS: 75.0000 mg | ORAL_TABLET | Freq: Every day | ORAL | 3 refills | Status: AC

## 2023-11-20 MED ORDER — HYDROXYZINE HCL 25 MG PO TABS: ORAL_TABLET | ORAL | 1 refills | Status: AC

## 2023-11-20 NOTE — Assessment & Plan Note (Signed)
 Reviewed health habits including diet and exercise and skin cancer prevention Reviewed appropriate screening tests for age  Also reviewed health mt list, fam hx and immunization status , as well as social and family history   See HPI Labs reviewed and ordered Health Maintenance  Topic Date Due   Hepatitis C Screening  12/26/2023*   Flu Shot  05/03/2024*   COVID-19 Vaccine (5 - 2025-26 season) 12/05/2025*   Colon Cancer Screening  06/08/2024   Medicare Annual Wellness Visit  08/13/2024   Breast Cancer Screening  03/02/2025   DTaP/Tdap/Td vaccine (3 - Td or Tdap) 02/21/2027   Pneumococcal Vaccine for age over 81  Completed   DEXA scan (bone density measurement)  Completed   Zoster (Shingles) Vaccine  Completed   Meningitis B Vaccine  Aged Out  *Topic was postponed. The date shown is not the original due date.    Mammogram due in January Discussed fall prevention, supplements and exercise for bone density  GAD, PHQ reviewed -with treatment , planning counseling referral Lab today

## 2023-11-20 NOTE — Progress Notes (Unsigned)
 Subjective:    Patient ID: Carolyn Matthews, female    DOB: 22-Sep-1955, 68 y.o.   MRN: [id number]  HPI  Here for health maintenance exam and to review chronic medical problems   Wt Readings from Last 3 Encounters:  11/20/23 180 lb 2 oz (81.7 kg)  08/14/23 180 lb (81.6 kg)  07/22/23 180 lb 2 oz (81.7 kg)   31.41 kg/m  Vitals:   11/20/23 1402 11/20/23 1422  BP: (!) 142/82 129/62  Pulse: 69   Temp: 98 F (36.7 C)   SpO2: 98%     Immunization History  Administered Date(s) Administered   Hepatitis A 08/11/2012   PFIZER(Purple Top)SARS-COV-2 Vaccination 05/13/2019, 06/03/2019, 02/27/2020   PNEUMOCOCCAL CONJUGATE-20 02/26/2022   Pfizer Covid-19 Vaccine Bivalent Booster 13yrs & up 08/01/2021   Td 02/03/2006   Tdap 02/20/2017   Zoster Recombinant(Shingrix) 06/06/2020, 11/29/2020    There are no preventive care reminders to display for this patient.  Feeling ok physically   Flu shot -declines   Mammogram 02/2023 Self breast exam- no lumps  Daughter had breast cancer   Gyn health No problems S/p hyst     Colon cancer screening  Colonoscopy 06/2014    Bone health  Dexa 09/2023  normal bmd  Falls-none  Fractures-none  Supplements  D 3  Ca from diet and mvi  Last vitamin D  Lab Results  Component Value Date   VD25OH 24 11/20/2023    Exercise  Had injury- then did not go back to it  (yoga, also vibrating pad)      Mood    11/20/2023    3:04 PM 08/14/2023    1:23 PM 09/23/2022   11:13 AM 02/26/2022   12:14 PM 01/21/2021    3:26 PM  Depression screen PHQ 2/9  Decreased Interest 1 0 3 2 0  Down, Depressed, Hopeless 0 0 1 1 0  PHQ - 2 Score 1 0 4 3 0  Altered sleeping 1  0 1 0  Tired, decreased energy 0  3 0 0  Change in appetite 0  2 0 0  Feeling bad or failure about yourself  0  0 0 0  Trouble concentrating 0  0 1 0  Moving slowly or fidgety/restless 0  0 0 0  Suicidal thoughts 0  0 0 0  PHQ-9 Score 2  9 5  0  Difficult doing work/chores  Somewhat difficult  Somewhat difficult Somewhat difficult Not difficult at all      11/20/2023    3:04 PM 09/23/2022   11:13 AM 02/26/2022   12:14 PM  GAD 7 : Generalized Anxiety Score  Nervous, Anxious, on Edge 1 2 1   Control/stop worrying 0 1 0  Worry too much - different things 0 1 0  Trouble relaxing 0 0 0  Restless 0 0 0  Easily annoyed or irritable 0 1 0  Afraid - awful might happen 1 1 1   Total GAD 7 Score 2 6 2   Anxiety Difficulty Somewhat difficult  Somewhat difficult     GAD Sertraline  75 mg daily  Still has anxiety attacks   2 per week max  Can last up to an hour   No counseling   Is interested in counseling  Does not want to increase sertraline     Hyperlipidemia Lab Results  Component Value Date   CHOL 203 (H) 11/20/2023   HDL 49 (L) 11/20/2023   LDLCALC 129 (H) 11/20/2023   TRIG 133 11/20/2023   CHOLHDL 4.1  11/20/2023    Prediabetes Lab Results  Component Value Date   HGBA1C 6.2 (H) 11/20/2023   HGBA1C 5.8 02/26/2022   HGBA1C 6.1 (H) 01/23/2021   History of anemia Lab Results  Component Value Date   WBC 9.6 11/20/2023   HGB 12.0 11/20/2023   HCT 37.1 11/20/2023   MCV 91.6 11/20/2023   PLT 390 11/20/2023   Lab Results  Component Value Date   IRON 86 11/20/2023   FERRITIN 145 11/20/2023       Patient Active Problem List   Diagnosis Date Noted   Neck pain on left side 07/22/2023   Left foot pain 03/03/2023   Left-sided headache 11/03/2022   Aortic atherosclerosis 09/23/2022   Estrogen deficiency 02/26/2022   Degenerative cervical disc 02/07/2022   Allergic rhinitis 04/06/2020   OSA (obstructive sleep apnea) 01/20/2020   Degenerative disc disease, lumbar 12/08/2016   Lumbar back pain with radiculopathy affecting left lower extremity 12/02/2016   Vitamin D  deficiency 10/03/2015   Colon cancer screening 08/11/2012   Anxiety disorder 02/26/2011   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   Dermatitis 06/24/2010   Routine general  medical examination at a health care facility 05/23/2010   MVP (mitral valve prolapse)    Chronic headaches    Seasonal allergies    Family history of breast cancer in female    Prediabetes 04/04/2009   Hyperlipidemia 11/22/2008   Anemia, unspecified 02/12/2007   OVERACTIVE BLADDER 01/11/2007   FIBROCYSTIC BREAST DISEASE 01/11/2007   Past Medical History:  Diagnosis Date   Allergy 07/1985   seasonal   Anemia    All my life   Anxiety 2012   Chronic headaches    Family history of breast cancer in female    Daughter; gene neg per pt   Fibrocystic breast disease    GERD (gastroesophageal reflux disease)    Hyperglycemia    Diet controlled   MVP (mitral valve prolapse)    Seasonal allergies    Sleep apnea 2021   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   Past Surgical History:  Procedure Laterality Date   ANKLE FRACTURE SURGERY  05/2003   BREAST BIOPSY  03/2004   Negative   BREAST BIOPSY Right 12/27/2014   fat necrosis / fibrocystic changes   BREAST CYST ASPIRATION  05/2007   Bilateral-benign   BREAST LUMPECTOMY  12/1977   TOTAL ABDOMINAL HYSTERECTOMY  09/1996   Fibroids   Social History   Tobacco Use   Smoking status: Never   Smokeless tobacco: Never  Vaping Use   Vaping status: Never Used  Substance Use Topics   Alcohol use: Yes    Alcohol/week: 0.0 standard drinks of alcohol    Comment: occasional use   Drug use: No   Family History  Problem Relation Age of Onset   Diabetes Father    Hypertension Mother    Breast cancer Daughter        (inflammatory)-now has brain tumor   Cancer Daughter    Diabetes Paternal Grandmother    Breast cancer Other        mat great aunt   Breast cancer Daughter        2nd daughter with breast cancer    Cancer Daughter    Colon cancer Neg Hx    Rectal cancer Neg Hx    Stomach cancer Neg Hx    Esophageal cancer Neg Hx    Allergies  Allergen Reactions   Egg Protein-Containing Drug Products Other (See Comments)  REACTION:  SEVERE REACTION PV 4-22 pt states she gets flu like symptoms all but fever, no anaphylasis   Hepatitis A Vaccine Other (See Comments)    ? Allergic reaction; itching,weakness almost immediate after taking 1st vaccine   Current Outpatient Medications on File Prior to Visit  Medication Sig Dispense Refill   conjugated estrogens  (PREMARIN ) vaginal cream Place 1 Applicatorful vaginally 2 (two) times a week. 0.5 gram vaginally at bedtime twice weekly 30 g 3   diazepam  (VALIUM ) 5 MG tablet 1 po 30 minutes prior to MRI scan. May repeat x 1 right before MRI scan if needed. You will need a driver. 2 tablet 0   fluocinonide  cream (LIDEX ) 0.05 % Apply topically 2 (two) times daily. 30 g 0   fluticasone  (FLONASE ) 50 MCG/ACT nasal spray USE 2 SPRAYS IN EACH NOSTRIL ONCE A DAY 48 g 2   Multiple Vitamin (MULTIVITAMIN) tablet Take 1 tablet by mouth daily.     No current facility-administered medications on file prior to visit.    Review of Systems  Constitutional:  Negative for activity change, appetite change, fatigue, fever and unexpected weight change.  HENT:  Negative for congestion, ear pain, rhinorrhea, sinus pressure and sore throat.   Eyes:  Negative for pain, redness and visual disturbance.  Respiratory:  Negative for cough, shortness of breath and wheezing.   Cardiovascular:  Negative for chest pain and palpitations.  Gastrointestinal:  Negative for abdominal pain, blood in stool, constipation and diarrhea.  Endocrine: Negative for polydipsia and polyuria.  Genitourinary:  Negative for dysuria, frequency and urgency.  Musculoskeletal:  Negative for arthralgias, back pain and myalgias.  Skin:  Negative for pallor and rash.  Allergic/Immunologic: Negative for environmental allergies.  Neurological:  Negative for dizziness, syncope and headaches.  Hematological:  Negative for adenopathy. Does not bruise/bleed easily.  Psychiatric/Behavioral:  Negative for decreased concentration and dysphoric  mood. The patient is nervous/anxious.        Objective:   Physical Exam Constitutional:      General: She is not in acute distress.    Appearance: Normal appearance. She is well-developed. She is not ill-appearing or diaphoretic.  HENT:     Head: Normocephalic and atraumatic.     Right Ear: Tympanic membrane, ear canal and external ear normal.     Left Ear: Tympanic membrane, ear canal and external ear normal.     Nose: Nose normal. No congestion.     Mouth/Throat:     Mouth: Mucous membranes are moist.     Pharynx: Oropharynx is clear. No posterior oropharyngeal erythema.  Eyes:     General: No scleral icterus.    Extraocular Movements: Extraocular movements intact.     Conjunctiva/sclera: Conjunctivae normal.     Pupils: Pupils are equal, round, and reactive to light.  Neck:     Thyroid : No thyromegaly.     Vascular: No carotid bruit or JVD.  Cardiovascular:     Rate and Rhythm: Normal rate and regular rhythm.     Pulses: Normal pulses.     Heart sounds: Normal heart sounds.     No gallop.  Pulmonary:     Effort: Pulmonary effort is normal. No respiratory distress.     Breath sounds: Normal breath sounds. No wheezing.     Comments: Good air exch Chest:     Chest wall: No tenderness.  Abdominal:     General: Bowel sounds are normal. There is no distension or abdominal bruit.     Palpations: Abdomen  is soft. There is no mass.     Tenderness: There is no abdominal tenderness.     Hernia: No hernia is present.  Genitourinary:    Comments: Breast exam: No mass, nodules, thickening, tenderness, bulging, retraction, inflamation, nipple discharge or skin changes noted.  No axillary or clavicular LA.     Musculoskeletal:        General: No tenderness. Normal range of motion.     Cervical back: Normal range of motion and neck supple. No rigidity. No muscular tenderness.     Right lower leg: No edema.     Left lower leg: No edema.     Comments: No kyphosis   Lymphadenopathy:      Cervical: No cervical adenopathy.  Skin:    General: Skin is warm and dry.     Coloration: Skin is not pale.     Findings: No erythema or rash.     Comments: Scattered sks   Neurological:     Mental Status: She is alert. Mental status is at baseline.     Cranial Nerves: No cranial nerve deficit.     Motor: No abnormal muscle tone.     Coordination: Coordination normal.     Gait: Gait normal.     Deep Tendon Reflexes: Reflexes are normal and symmetric. Reflexes normal.  Psychiatric:        Mood and Affect: Mood normal.        Cognition and Memory: Cognition and memory normal.           Assessment & Plan:   Problem List Items Addressed This Visit       Respiratory   OSA (obstructive sleep apnea)   cpap        Other   Vitamin D  deficiency   D level today  Oral supplementation  For bone and overall health       Relevant Orders   VITAMIN D  25 Hydroxy (Vit-D Deficiency, Fractures) (Completed)   Routine general medical examination at a health care facility - Primary   Reviewed health habits including diet and exercise and skin cancer prevention Reviewed appropriate screening tests for age  Also reviewed health mt list, fam hx and immunization status , as well as social and family history   See HPI Labs reviewed and ordered Health Maintenance  Topic Date Due   Hepatitis C Screening  12/26/2023*   Flu Shot  05/03/2024*   COVID-19 Vaccine (5 - 2025-26 season) 12/05/2025*   Colon Cancer Screening  06/08/2024   Medicare Annual Wellness Visit  08/13/2024   Breast Cancer Screening  03/02/2025   DTaP/Tdap/Td vaccine (3 - Td or Tdap) 02/21/2027   Pneumococcal Vaccine for age over 13  Completed   DEXA scan (bone density measurement)  Completed   Zoster (Shingles) Vaccine  Completed   Meningitis B Vaccine  Aged Out  *Topic was postponed. The date shown is not the original due date.    Mammogram due in January Discussed fall prevention, supplements and exercise for  bone density  GAD, PHQ reviewed -with treatment , planning counseling referral Lab today        Prediabetes   A1c ordered  disc imp of low glycemic diet and wt loss to prevent DM2       Relevant Orders   Hemoglobin A1c (Completed)   Hyperlipidemia   Disc goals for lipids and reasons to control them Rev last labs with pt Rev low sat fat diet in detail  Lab today Fair  diet Needs more exercise       Relevant Orders   Comprehensive metabolic panel with GFR (Completed)   Lipid Panel (Completed)   TSH (Completed)   Colon cancer screening   Colonoscopy 06/2014 with 10 y recall      Anxiety disorder   Still exp panic attacks on avg twice weekly despite sertraline  75 mg daily  Hydroxyzine  prn  Does not want to increase medication   Interested in counseling  Referral done       Relevant Medications   hydrOXYzine  (ATARAX ) 25 MG tablet   sertraline  (ZOLOFT ) 50 MG tablet   Other Relevant Orders   Ambulatory referral to Psychology   Anemia, unspecified   Mild in past  Cbc, iron labs today      Relevant Orders   CBC with Differential/Platelet (Completed)   Ferritin (Completed)   Iron (Completed)

## 2023-11-20 NOTE — Patient Instructions (Addendum)
 Get back to exercise if you can Add some strength training to your routine, this is important for bone and brain health and can reduce your risk of falls and help your body use insulin properly and regulate weight  Light weights, exercise bands , and internet videos are a good way to start  Yoga (chair or regular), machines , floor exercises or a gym with machines are also good options    I put the referral in for mental health counseling  Please let us  know if you don't hear in 1-2 weeks to set that up (mychart message or call or letter)    Take care of yourself   Labs today

## 2023-11-20 NOTE — Assessment & Plan Note (Signed)
 Mild in past  Cbc, iron labs today

## 2023-11-20 NOTE — Assessment & Plan Note (Signed)
 A1c ordered   disc imp of low glycemic diet and wt loss to prevent DM2

## 2023-11-20 NOTE — Assessment & Plan Note (Signed)
 D level today  Oral supplementation  For bone and overall health

## 2023-11-20 NOTE — Assessment & Plan Note (Signed)
 Disc goals for lipids and reasons to control them Rev last labs with pt Rev low sat fat diet in detail  Lab today Fair diet Needs more exercise

## 2023-11-20 NOTE — Assessment & Plan Note (Signed)
 cpap

## 2023-11-20 NOTE — Assessment & Plan Note (Signed)
 Still exp panic attacks on avg twice weekly despite sertraline  75 mg daily  Hydroxyzine  prn  Does not want to increase medication   Interested in counseling  Referral done

## 2023-11-20 NOTE — Assessment & Plan Note (Signed)
 Colonoscopy 06/2014 with 10 y recall

## 2023-11-21 LAB — CBC WITH DIFFERENTIAL/PLATELET
Absolute Lymphocytes: 2314 {cells}/uL (ref 850–3900)
Absolute Monocytes: 547 {cells}/uL (ref 200–950)
Basophils Absolute: 19 {cells}/uL (ref 0–200)
Basophils Relative: 0.2 %
Eosinophils Absolute: 278 {cells}/uL (ref 15–500)
Eosinophils Relative: 2.9 %
HCT: 37.1 % (ref 35.0–45.0)
Hemoglobin: 12 g/dL (ref 11.7–15.5)
MCH: 29.6 pg (ref 27.0–33.0)
MCHC: 32.3 g/dL (ref 32.0–36.0)
MCV: 91.6 fL (ref 80.0–100.0)
MPV: 9.9 fL (ref 7.5–12.5)
Monocytes Relative: 5.7 %
Neutro Abs: 6442 {cells}/uL (ref 1500–7800)
Neutrophils Relative %: 67.1 %
Platelets: 390 Thousand/uL (ref 140–400)
RBC: 4.05 Million/uL (ref 3.80–5.10)
RDW: 12.9 % (ref 11.0–15.0)
Total Lymphocyte: 24.1 %
WBC: 9.6 Thousand/uL (ref 3.8–10.8)

## 2023-11-21 LAB — COMPREHENSIVE METABOLIC PANEL WITH GFR
AG Ratio: 1.7 (calc) (ref 1.0–2.5)
ALT: 12 U/L (ref 6–29)
AST: 16 U/L (ref 10–35)
Albumin: 4.5 g/dL (ref 3.6–5.1)
Alkaline phosphatase (APISO): 73 U/L (ref 37–153)
BUN: 17 mg/dL (ref 7–25)
CO2: 28 mmol/L (ref 20–32)
Calcium: 9.9 mg/dL (ref 8.6–10.4)
Chloride: 103 mmol/L (ref 98–110)
Creat: 0.88 mg/dL (ref 0.50–1.05)
Globulin: 2.7 g/dL (ref 1.9–3.7)
Glucose, Bld: 96 mg/dL (ref 65–99)
Potassium: 4.4 mmol/L (ref 3.5–5.3)
Sodium: 141 mmol/L (ref 135–146)
Total Bilirubin: 0.5 mg/dL (ref 0.2–1.2)
Total Protein: 7.2 g/dL (ref 6.1–8.1)
eGFR: 72 mL/min/1.73m2 (ref >=60)

## 2023-11-21 LAB — LIPID PANEL
Cholesterol: 203 mg/dL — ABNORMAL HIGH (ref <200)
HDL: 49 mg/dL — ABNORMAL LOW (ref >=50)
LDL Cholesterol (Calc): 129 mg/dL — ABNORMAL HIGH
Non-HDL Cholesterol (Calc): 154 mg/dL — ABNORMAL HIGH (ref <130)
Total CHOL/HDL Ratio: 4.1 (calc) (ref <5.0)
Triglycerides: 133 mg/dL (ref <150)

## 2023-11-21 LAB — VITAMIN D 25 HYDROXY (VIT D DEFICIENCY, FRACTURES): Vit D, 25-Hydroxy: 70 ng/mL (ref 30–100)

## 2023-11-21 LAB — HEMOGLOBIN A1C
Hgb A1c MFr Bld: 6.2 % — ABNORMAL HIGH (ref <5.7)
Mean Plasma Glucose: 131 mg/dL
eAG (mmol/L): 7.3 mmol/L

## 2023-11-21 LAB — IRON: Iron: 86 ug/dL (ref 45–160)

## 2023-11-21 LAB — TSH: TSH: 1.83 m[IU]/L (ref 0.40–4.50)

## 2023-11-21 LAB — FERRITIN: Ferritin: 145 ng/mL (ref 16–288)

## 2023-11-22 ENCOUNTER — Ambulatory Visit: Payer: Self-pay | Admitting: Family Medicine

## 2023-11-22 DIAGNOSIS — E7800 Pure hypercholesterolemia, unspecified: Secondary | ICD-10-CM

## 2023-11-23 ENCOUNTER — Telehealth: Payer: Self-pay | Admitting: *Deleted

## 2023-11-23 NOTE — Telephone Encounter (Signed)
 lvm for pt to call office to schedule appt.

## 2023-11-23 NOTE — Telephone Encounter (Signed)
 Pt viewed labs via mychart. Per Dr. Randeen pt needs a fasting lab appt in 3 months. Please schedule

## 2023-11-25 ENCOUNTER — Encounter: Payer: Self-pay | Admitting: *Deleted

## 2023-11-26 NOTE — Telephone Encounter (Signed)
 Pt  stated that she will call back and schedule appt later

## 2023-12-29 ENCOUNTER — Encounter: Payer: Self-pay | Admitting: Family Medicine

## 2023-12-29 MED ORDER — FLUCONAZOLE 150 MG PO TABS: 150.0000 mg | ORAL_TABLET | Freq: Once | ORAL | 0 refills | Status: AC

## 2024-01-14 ENCOUNTER — Ambulatory Visit: Admitting: Clinical

## 2024-01-14 DIAGNOSIS — F411 Generalized anxiety disorder: Secondary | ICD-10-CM

## 2024-01-14 DIAGNOSIS — F4000 Agoraphobia, unspecified: Secondary | ICD-10-CM

## 2024-01-14 NOTE — Progress Notes (Signed)
   Darice Seats, LCSW

## 2024-01-14 NOTE — Progress Notes (Signed)
 [facility] Counselor Initial Adult Exam  Name: Carolyn Matthews Date: 01/14/2024 MRN: [id number] DOB: January 01, 1956 PCP: Randeen Laine LABOR, MD  Time spent: 2:36pm - 3:21pm   Guardian/Payee:  NA    Paperwork requested: NA  Reason for Visit /Presenting Problem: Patient stated, my mother died 2 years ago and I thought I was handling it real good and later on I began to, didn't want to leave the house, was afraid to leave the house, I'm not afraid to leave the house now, afraid to go outside. Patient stated, I went to see Dr. Randeen and told her about it and she said it sounds like you have agoraphobia.   Mental Status Exam: Appearance:   Neat and Well Groomed     Behavior:  Appropriate  Motor:  Normal  Speech/Language:   Clear and Coherent and Normal Rate  Affect:  Appropriate  Mood:  anxious  Thought process:  normal  Thought content:    WNL  Sensory/Perceptual disturbances:    WNL  Orientation:  oriented to person, place, situation, day of week, month of year, and year  Attention:  Good  Concentration:  Good  Memory:  WNL  Fund of knowledge:   Good  Insight:    Good  Judgment:   Good  Impulse Control:  Good   Reported Symptoms:  Patient reported being afraid to go outside of the house by herself, feels someone is behind me, steady looking around, just afraid, I'm afraid that somebody's there, I don't go far if I do go, I usually never leave the porch, distressed when outside, anxiety throughout the day, sometimes to the point of chest pains, I can't take full breaths, difficulty being around large crowds and stated, I get real anxious, that I do believe I've always had, I'm always tired, difficulty falling and staying asleep, irritability, feeling on edge, shaking, worry. Patient reported I had anxiety even before my mom died but it wasn't to this level. Patient stated, its been bad in response to memory and reported decreased concentration,  decreased motivation, depressed mood sometimes and lasts several hours, decreased energy. Patient reported depressive symptoms occur when I start thinking about different things.  Risk Assessment: Danger to Self:  No Patient denied current and past suicidal ideation, homicidal ideation, or symptoms of psychosis.  Self-injurious Behavior: No Danger to Others: No Duty to Warn:no Physical Aggression / Violence:No  Access to Firearms a concern: No  Gang Involvement:No  Patient / guardian was educated about steps to take if suicide or homicide risk level increases between visits: yes While future psychiatric events cannot be accurately predicted, the patient does not currently require acute inpatient psychiatric care and does not currently meet [place]  involuntary commitment criteria.  Substance Abuse History: Current substance abuse: No   Patient denied current and past tobacco use and drug use. Patient reported alcohol use (1/2 - 1 glass of wine) twice per month.   Past Psychiatric History:   Previous psychological history is significant for anxiety Outpatient Providers: none History of Psych Hospitalization: No  Psychological Testing: has taken IQ tests in the past   Abuse History:  Victim of: No., none   Report needed: No. Victim of Neglect:No. Perpetrator of none reported  Witness / Exposure to Domestic Violence: No   Protective Services Involvement: No  Witness to [employer] Violence:  No   Family History:  Family History  Problem Relation Age of Onset   Diabetes Father    Hypertension Mother  Breast cancer Daughter        (inflammatory)-now has brain tumor   Cancer Daughter    Diabetes Paternal Grandmother    Breast cancer Other        mat great aunt   Breast cancer Daughter        2nd daughter with breast cancer    Cancer Daughter    Colon cancer Neg Hx    Rectal cancer Neg Hx    Stomach cancer Neg Hx    Esophageal cancer Neg Hx     Living situation:  the patient lives with their family (husband, daughter)  Sexual Orientation: Straight  Relationship Status: married  Name of spouse / other: Maxwell If a parent, number of children / ages: 2 daughters (ages 73, 8)  Support Systems: spouse Brother  Surveyor, Quantity Stress:  No   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 0.5 gram vaginally at  bedtime twice weekly 30 g 3   diazepam  (VALIUM ) 5 MG tablet 1 po 30 minutes prior to MRI scan. May repeat x 1 right before MRI scan if needed. You will need a driver. 2 tablet 0   fluocinonide  cream (LIDEX ) 0.05 % Apply topically 2 (two) times daily. 30 g 0   fluticasone  (FLONASE ) 50 MCG/ACT nasal spray USE 2 SPRAYS IN EACH NOSTRIL ONCE A DAY 48 g 2   hydrOXYzine  (ATARAX ) 25 MG tablet Take 1-2 tablets by mouth every 8 hours as needed for anxiety and panic 90 tablet 1   Multiple Vitamin (MULTIVITAMIN) tablet Take 1 tablet by mouth daily.     sertraline  (ZOLOFT ) 50 MG tablet Take 1.5 tablets (75 mg total) by mouth daily. 135 tablet 3   No current facility-administered medications for this visit.    Allergies[1]  Diagnoses:  Agoraphobia  Generalized anxiety disorder  Plan of Care: Patient is a 68 year old female  who presented for an initial assessment. Clinician conducted initial assessment in person from clinician's office at [facility]. Patient reported the following symptoms: afraid to go outside of the house by herself, feels someone is behind me, steady looking around, just afraid, I'm afraid that somebody's there, distressed when outside, anxiety throughout the day, sometimes to the point of chest pains, I can't take full breaths, difficulty being around large crowds, I'm always tired, difficulty falling and staying asleep, irritability, feeling on edge, shaking, worry, its been bad in response to memory, decreased concentration, decreased motivation, depressed mood sometimes, decreased energy. Patient denied current and past suicidal ideation, homicidal ideation, and symptoms of psychosis. Patient denied current and past tobacco use and drug use. Patient reported alcohol use twice per month. Patient reported no history of outpatient or inpatient behavioral health treatment. Patient reported tasks to be completed and daughters' health are current stressors. Patient  reported patient's spouse and brother are current supports. It is recommended patient be referred to a psychiatrist for a consult and recommended patient participate in individual therapy weekly. Clinician will review recommendations and treatment plan with patient during follow up appointment. Treatment plan will be developed during follow up appointment.   Collaboration of Care: Primary Care Provider AEB Patient requested to complete a consent for PCP, Dr. Laine Balls  Patient/Guardian was advised Release of Information must be obtained prior to any record release in order to collaborate their care with an outside provider. Patient/Guardian was advised if they have not already done so to contact [facility] to sign all necessary forms in order for us  to release information regarding their care.   Consent: Patient/Guardian gives written consent for treatment and assignment of benefits for services provided during this visit. Patient/Guardian expressed understanding and agreed to proceed.   Darice Seats, LCSW        [1]  Allergies Allergen Reactions   Egg Protein-Containing Drug Products Other (See Comments)    REACTION: SEVERE REACTION PV 4-22 pt states she gets flu like symptoms all but fever, no anaphylasis   Hepatitis A Vaccine Other (See Comments)    ? Allergic reaction; itching,weakness almost immediate after taking 1st vaccine

## 2024-03-01 ENCOUNTER — Encounter: Payer: Self-pay | Admitting: Clinical

## 2024-03-01 ENCOUNTER — Ambulatory Visit: Admitting: Clinical

## 2024-03-01 DIAGNOSIS — F411 Generalized anxiety disorder: Secondary | ICD-10-CM

## 2024-03-01 DIAGNOSIS — F4000 Agoraphobia, unspecified: Secondary | ICD-10-CM

## 2024-03-01 NOTE — Progress Notes (Signed)
   Darice Seats, LCSW

## 2024-03-01 NOTE — Progress Notes (Signed)
 Behavioral Health Treatment Plan   Name:Carolyn Matthews  [facility] Medicare  MRN: [id number]   Treatment Plan Development Date: 03/01/24   Strengths: Family and Spirituality  Supports: Spouse and brother   Client Statement of Needs: At the time of assessment patient stated, my mother died 2 years ago and I thought I was handling it real good and later on I began to, didn't want to leave the house, was afraid to leave the house, I'm not afraid to leave the house now, afraid to go outside. Patient stated, I went to see Dr. Randeen and told her about it and she said it sounds like you have agoraphobia.    Treatment Level: outpatient therapy  Client Treatment Preferences: Tuesday and Thursdays   Diagnosis: Anxiety  Generalized Anxiety Disorder  Symptoms:  Excessive and/or unrealistic worry that is difficult to control occurring more days than not for at least 6 months about a number of events or activities, Difficulty managing worry, Restlessness or feeling keyed up or on edge, Being easily fatigued, Irritability, and Sleep disturbance (Difficulty falling or staying asleep, restlessness, or unsatisfying sleep  Goals:  Improve daily functioning by reducing overall anxiety symptoms including intensity, frequency, and duration of symptoms., Build and employ tools and skills to reduce symptoms of anxiety, worry, and improve functioning day-to-day., and Address negative cognitions, distortions, and behaviors that contribute to anxiety symptoms and affect overall functioning.  Objectives: Target Date For All Objectives: 03/01/25  Develop coping tools to manage anxiety including mindfulness, acceptance, relaxation, reframing, and challenging negative thoughts and feelings., Identify, challenge, and manage negative self-talk, negative thinking about self and others, and maintain mindfulness regarding self-talk and cognitive distortions., Identify contributing factors to current anxiety  including family of origin issues, past trauma, significant stressors, and negative cognitions., and Identify contributing factors to anxiety including previous or current situations.  Progress Documentation:  Established 03/01/14  Interventions:  CBT - reframing, challenging, cognitive restructuring, Relaxation and mindfulness, and Problem Solving   and Tx Plan for Panic Disorders  Individualized Treatment Plan (03/01/2024)  Problem:   Panic Disorders/Agoraphobia  Agoraphobia  Symptoms:  Marked fear/anxiety of 2 or more: using public transportation, being in open spaces, being in enclosed spaces, being in crowd/in line, being outside of the home alone., Avoids situations for fear of not being able to escape or help not being available if panic symptoms arise., Agoraphobic situations almost always provoke fear/anxiety., and Increasingly isolates/avoids leaving a safe environment or with a safe person.  Goals:  Reduce the frequency, intensity, and duration of panic attacks., Reduce fear that patient won't be able to manage panic symptoms., Reduce the fear of triggering panic attacks and avoidance of activities or environments that trigger panic., and Increase comfort in leaving home alone and being in public environments.  Objectives: Target Date For All Objectives:03/01/25  Practice relaxation, grounding and mindfulness strategies to reduce anxiety and cope with panic., Identify, challenge, and replace biased, fearful self-talk with reality-based, positive self-talk., and Learn and implement problem solving strategies  Progress Documentation:  Established 03/01/24  Interventions:  CBT - reframing, challenging, cognitive restructuring, Relaxation and mindfulness, Distress tolerance, and Problem Solving   The patient and clinician reviewed the treatment plan on 03/01/2024. The patient provided verbal consent for the treatment plan and the consent for with electronic signature  is on file in the patients electronic medical record.   Expected duration of treatment: 12 months  Party responsible for implementation of interventions: Darice Seats, MSW, LCSW, therapist and Ariatna Jester,  patient.   This plan has been reviewed and created by the following participants: Darice Seats, MSW, LCSW, therapist and Jenna Routzahn, patient.   A new plan will be created at least every 12 months.  The patient fully participated in the development of treatment plan with the clinician and verbally consents to such treatment.   Patient Treatment Plan Signature Obtained: No, pending signature via MyChart.                  Darice Seats, LCSW

## 2024-03-01 NOTE — Progress Notes (Signed)
 "  [facility] Counselor/Therapist Progress Note  Patient ID: Carolyn Matthews, MRN: [id number],    Date: 03/01/2024  Time Spent: 1:33pm - 2:24pm : 51 minutes   Treatment Type: Individual Therapy  Reported Symptoms: panic attacks several weeks ago, difficulty staying asleep  Mental Status Exam: Appearance:  Neat and Well Groomed     Behavior: Appropriate  Motor: Normal  Speech/Language:  Clear and Coherent and Normal Rate  Affect: Appropriate  Mood: Patient stated, busy, kind of rushed in response to current mood  Thought process: normal  Thought content:   WNL  Sensory/Perceptual disturbances:   WNL  Orientation: oriented to person, place, time/date, and situation  Attention: Good  Concentration: Good  Memory: WNL  Fund of knowledge:  Good  Insight:   Good  Judgment:  Good  Impulse Control: Good   Risk Assessment: Danger to Self:  No Patient denied current suicidal ideation  Self-injurious Behavior: No Danger to Others: No Patient denied current homicidal ideation Duty to Warn:no Physical Aggression / Violence:No  Access to Firearms a concern: No  Gang Involvement:No   Subjective: Patient reported no changes since last session.  Patient reported ok,  some times I get down and I can't sleep in response to mood since last session. Patient stated, busy, kind of rushed in response to current mood. Patient stated, sometimes I feel down but not a whole lot. Patient reported symptoms started several months after the death of patient's mother. 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 Patient stated, maybe not right now in response to consultation with a psychiatrist. Patient stated, that should be ok in response to recommendation for therapy. Patient reported I would really like to get rid of that feeling of just being safe in the house, feeling more comfortable around crowds in response to goals for  therapy.   Interventions: Motivational Interviewing. Clinician conducted session via caregility video from clinician's home office. Patient provided verbal consent to proceed with telehealth session and is aware of limitations of telephone or video visits. Patient participated in session from patient's home. Clinician reviewed diagnosis and treatment recommendations; and provided psycho education related to diagnosis and treatment as part of treatment planning process. Clinician utilized motivational interviewing to explore potential goals for therapy. Clinician utilized a task centered approach in collaboration with patient to develop treatment plan. Patient participated in development of treatment plan and verbally agreed to treatment plan.  Diagnosis:Agoraphobia  Generalized anxiety disorder  Collaboration of Care: Other not required at this time  Plan: Behavioral Health Treatment Plan   Name:Carolyn Matthews  [facility] Medicare  MRN: [id number]   Treatment Plan Development Date: 03/01/24   Strengths: Family and Spirituality  Supports: Spouse and brother   Client Statement of Needs: At the time of assessment patient stated, my mother died 2 years ago and I thought I was handling it real good and later on I began to, didn't want to leave the house, was afraid to leave the house, I'm not afraid to leave the house now, afraid to go outside. Patient stated, I went to see Dr. Randeen and told her about it and she said it sounds like you have agoraphobia.    Treatment Level: outpatient therapy  Client Treatment Preferences: Tuesday and Thursdays   Diagnosis: Anxiety  Generalized Anxiety Disorder  Symptoms:  Excessive and/or unrealistic worry that is difficult to control occurring more days than not for at least 6 months about  a number of events or activities, Difficulty managing worry, Restlessness or feeling keyed up or on edge, Being easily fatigued, Irritability, and Sleep  disturbance (Difficulty falling or staying asleep, restlessness, or unsatisfying sleep  Goals:  Improve daily functioning by reducing overall anxiety symptoms including intensity, frequency, and duration of symptoms., Build and employ tools and skills to reduce symptoms of anxiety, worry, and improve functioning day-to-day., and Address negative cognitions, distortions, and behaviors that contribute to anxiety symptoms and affect overall functioning.  Objectives: Target Date For All Objectives: 03/01/25  Develop coping tools to manage anxiety including mindfulness, acceptance, relaxation, reframing, and challenging negative thoughts and feelings., Identify, challenge, and manage negative self-talk, negative thinking about self and others, and maintain mindfulness regarding self-talk and cognitive distortions., Identify contributing factors to current anxiety including family of origin issues, past trauma, significant stressors, and negative cognitions., and Identify contributing factors to anxiety including previous or current situations.  Progress Documentation:  Established 03/01/14  Interventions:  CBT - reframing, challenging, cognitive restructuring, Relaxation and mindfulness, and Problem Solving   and Tx Plan for Panic Disorders  Individualized Treatment Plan (03/01/2024)  Problem:   Panic Disorders/Agoraphobia  Agoraphobia  Symptoms:  Marked fear/anxiety of 2 or more: using public transportation, being in open spaces, being in enclosed spaces, being in crowd/in line, being outside of the home alone., Avoids situations for fear of not being able to escape or help not being available if panic symptoms arise., Agoraphobic situations almost always provoke fear/anxiety., and Increasingly isolates/avoids leaving a safe environment or with a safe person.  Goals:  Reduce the frequency, intensity, and duration of panic attacks., Reduce fear that patient won't be able to manage  panic symptoms., Reduce the fear of triggering panic attacks and avoidance of activities or environments that trigger panic., and Increase comfort in leaving home alone and being in public environments.  Objectives: Target Date For All Objectives:03/01/25  Practice relaxation, grounding and mindfulness strategies to reduce anxiety and cope with panic., Identify, challenge, and replace biased, fearful self-talk with reality-based, positive self-talk., and Learn and implement problem solving strategies  Progress Documentation:  Established 03/01/24  Interventions:  CBT - reframing, challenging, cognitive restructuring, Relaxation and mindfulness, Distress tolerance, and Problem Solving   The patient and clinician reviewed the treatment plan on 03/01/2024. The patient provided verbal consent for the treatment plan and the consent for with electronic signature is on file in the patients electronic medical record.   Expected duration of treatment: 12 months  Party responsible for implementation of interventions: Darice Seats, MSW, LCSW, therapist and Elita Dame, patient.   This plan has been reviewed and created by the following participants: Darice Seats, MSW, LCSW, therapist and Shataria Crist, patient.   A new plan will be created at least every 12 months.  The patient fully participated in the development of treatment plan with the clinician and verbally consents to such treatment.   Patient Treatment Plan Signature Obtained: No, pending signature via MyChart.   Darice Seats, LCSW      "

## 2024-04-01 ENCOUNTER — Ambulatory Visit: Admitting: Clinical

## 2024-04-08 ENCOUNTER — Ambulatory Visit: Admitting: Clinical

## 2024-08-16 ENCOUNTER — Ambulatory Visit
